# Patient Record
Sex: Female | Born: 1960
Health system: Southern US, Community
[De-identification: ages and names within clinical notes are randomized; demographics above are authoritative.]

## PROBLEM LIST (undated history)

## (undated) DIAGNOSIS — I10 Essential (primary) hypertension: Secondary | ICD-10-CM

## (undated) DIAGNOSIS — J189 Pneumonia, unspecified organism: Secondary | ICD-10-CM

## (undated) DIAGNOSIS — M7989 Other specified soft tissue disorders: Secondary | ICD-10-CM

## (undated) DIAGNOSIS — E785 Hyperlipidemia, unspecified: Secondary | ICD-10-CM

## (undated) DIAGNOSIS — L603 Nail dystrophy: Secondary | ICD-10-CM

## (undated) DIAGNOSIS — R609 Edema, unspecified: Secondary | ICD-10-CM

## (undated) DIAGNOSIS — E538 Deficiency of other specified B group vitamins: Secondary | ICD-10-CM

## (undated) DIAGNOSIS — Z973 Presence of spectacles and contact lenses: Secondary | ICD-10-CM

## (undated) DIAGNOSIS — H18609 Keratoconus, unspecified, unspecified eye: Secondary | ICD-10-CM

## (undated) DIAGNOSIS — R1310 Dysphagia, unspecified: Secondary | ICD-10-CM

## (undated) DIAGNOSIS — E559 Vitamin D deficiency, unspecified: Secondary | ICD-10-CM

## (undated) DIAGNOSIS — M2550 Pain in unspecified joint: Secondary | ICD-10-CM

## (undated) DIAGNOSIS — K589 Irritable bowel syndrome without diarrhea: Secondary | ICD-10-CM

## (undated) DIAGNOSIS — K219 Gastro-esophageal reflux disease without esophagitis: Secondary | ICD-10-CM

## (undated) DIAGNOSIS — Z91018 Allergy to other foods: Secondary | ICD-10-CM

## (undated) DIAGNOSIS — T7840XA Allergy, unspecified, initial encounter: Secondary | ICD-10-CM

## (undated) DIAGNOSIS — K9041 Non-celiac gluten sensitivity: Secondary | ICD-10-CM

## (undated) DIAGNOSIS — E669 Obesity, unspecified: Secondary | ICD-10-CM

## (undated) DIAGNOSIS — S0300XA Dislocation of jaw, unspecified side, initial encounter: Secondary | ICD-10-CM

## (undated) DIAGNOSIS — K829 Disease of gallbladder, unspecified: Secondary | ICD-10-CM

## (undated) DIAGNOSIS — D649 Anemia, unspecified: Secondary | ICD-10-CM

## (undated) HISTORY — DX: Pain in unspecified joint: M25.50

## (undated) HISTORY — DX: Essential (primary) hypertension: I10

## (undated) HISTORY — PX: TONSILLECTOMY: SUR1361

## (undated) HISTORY — DX: Presence of spectacles and contact lenses: Z97.3

## (undated) HISTORY — DX: Morbid (severe) obesity due to excess calories: E66.01

## (undated) HISTORY — DX: Allergy, unspecified, initial encounter: T78.40XA

## (undated) HISTORY — DX: Keratoconus, unspecified, unspecified eye: H18.609

## (undated) HISTORY — DX: Irritable bowel syndrome, unspecified: K58.9

## (undated) HISTORY — DX: Dislocation of jaw, unspecified side, initial encounter: S03.00XA

## (undated) HISTORY — DX: Edema, unspecified: R60.9

## (undated) HISTORY — DX: Anemia, unspecified: D64.9

## (undated) HISTORY — PX: CHOLECYSTECTOMY: SHX55

## (undated) HISTORY — DX: Non-celiac gluten sensitivity: K90.41

## (undated) HISTORY — DX: Vitamin D deficiency, unspecified: E55.9

## (undated) HISTORY — DX: Nail dystrophy: L60.3

## (undated) HISTORY — DX: Hyperlipidemia, unspecified: E78.5

## (undated) HISTORY — DX: Disease of gallbladder, unspecified: K82.9

## (undated) HISTORY — DX: Deficiency of other specified B group vitamins: E53.8

## (undated) HISTORY — DX: Allergy to other foods: Z91.018

## (undated) HISTORY — DX: Other specified soft tissue disorders: M79.89

## (undated) HISTORY — DX: Obesity, unspecified: E66.9

## (undated) HISTORY — DX: Dysphagia, unspecified: R13.10

---

## 2000-02-02 ENCOUNTER — Other Ambulatory Visit: Admission: RE | Admit: 2000-02-02 | Discharge: 2000-02-02 | Payer: Self-pay | Admitting: *Deleted

## 2002-04-10 ENCOUNTER — Encounter: Admission: RE | Admit: 2002-04-10 | Discharge: 2002-07-09 | Payer: Self-pay | Admitting: Internal Medicine

## 2002-07-17 ENCOUNTER — Encounter: Admission: RE | Admit: 2002-07-17 | Discharge: 2002-10-08 | Payer: Self-pay | Admitting: Internal Medicine

## 2004-06-10 ENCOUNTER — Emergency Department (HOSPITAL_COMMUNITY): Admission: EM | Admit: 2004-06-10 | Discharge: 2004-06-10 | Payer: Self-pay | Admitting: Emergency Medicine

## 2006-07-28 ENCOUNTER — Ambulatory Visit (HOSPITAL_COMMUNITY): Admission: RE | Admit: 2006-07-28 | Discharge: 2006-07-28 | Payer: Self-pay | Admitting: *Deleted

## 2006-07-28 ENCOUNTER — Encounter: Admission: RE | Admit: 2006-07-28 | Discharge: 2006-07-28 | Payer: Self-pay | Admitting: *Deleted

## 2006-08-03 ENCOUNTER — Ambulatory Visit (HOSPITAL_COMMUNITY): Admission: RE | Admit: 2006-08-03 | Discharge: 2006-08-03 | Payer: Self-pay | Admitting: *Deleted

## 2007-01-09 ENCOUNTER — Encounter: Admission: RE | Admit: 2007-01-09 | Discharge: 2007-04-09 | Payer: Self-pay | Admitting: *Deleted

## 2007-01-23 ENCOUNTER — Ambulatory Visit (HOSPITAL_COMMUNITY): Admission: RE | Admit: 2007-01-23 | Discharge: 2007-01-24 | Payer: Self-pay | Admitting: *Deleted

## 2007-01-23 HISTORY — PX: LAPAROSCOPIC GASTRIC BANDING: SHX1100

## 2011-01-16 ENCOUNTER — Encounter: Payer: Self-pay | Admitting: Internal Medicine

## 2011-07-13 ENCOUNTER — Ambulatory Visit (INDEPENDENT_AMBULATORY_CARE_PROVIDER_SITE_OTHER): Payer: Self-pay | Admitting: Surgery

## 2011-08-17 ENCOUNTER — Encounter (INDEPENDENT_AMBULATORY_CARE_PROVIDER_SITE_OTHER): Payer: Self-pay | Admitting: General Surgery

## 2011-08-19 ENCOUNTER — Ambulatory Visit (INDEPENDENT_AMBULATORY_CARE_PROVIDER_SITE_OTHER): Payer: 59 | Admitting: Surgery

## 2011-08-19 ENCOUNTER — Encounter (INDEPENDENT_AMBULATORY_CARE_PROVIDER_SITE_OTHER): Payer: Self-pay | Admitting: Surgery

## 2011-08-19 DIAGNOSIS — Z9889 Other specified postprocedural states: Secondary | ICD-10-CM

## 2011-08-19 DIAGNOSIS — Z9884 Bariatric surgery status: Secondary | ICD-10-CM

## 2011-08-19 DIAGNOSIS — M793 Panniculitis, unspecified: Secondary | ICD-10-CM

## 2011-08-19 NOTE — Progress Notes (Signed)
Susan Dorsey (new name) comes in to discuss panniculectomy. A recalculated all of her weight loss as she corrected neither preoperative weight was in fact 372 instead of 318. With that she's loss in excess of 150 pounds. She has a very prominent pannus beneath which is a lot of maceration. Earlier in the year she had ringworm. That's no question she has significant panniculitis and maceration.She would like for me to do a panniculectomy. I told her that I did this in a way that oftentimes resect the umbilicus. She realizes this may be different from the white plastic surgeons practice this. This is noncosmetic A. She is at significant medical issues with it.  From the standpoint of her vagotomy she has done very well. The vagotomy ablated the hunger pains that she would often have when she was working night shift. She denies any diarrhea or dumping. She did very happy with her success and is in the green zone. She is off the 2 medications that she was previously on for hypertension.  Plan Will take pictures of her panniculitis today to submit for insurance approval.

## 2012-03-02 ENCOUNTER — Encounter (INDEPENDENT_AMBULATORY_CARE_PROVIDER_SITE_OTHER): Payer: 59 | Admitting: Surgery

## 2012-03-09 ENCOUNTER — Encounter (INDEPENDENT_AMBULATORY_CARE_PROVIDER_SITE_OTHER): Payer: 59 | Admitting: Surgery

## 2012-03-28 ENCOUNTER — Encounter (INDEPENDENT_AMBULATORY_CARE_PROVIDER_SITE_OTHER): Payer: 59 | Admitting: Surgery

## 2012-05-03 ENCOUNTER — Encounter (INDEPENDENT_AMBULATORY_CARE_PROVIDER_SITE_OTHER): Payer: 59 | Admitting: Surgery

## 2012-05-25 ENCOUNTER — Encounter (INDEPENDENT_AMBULATORY_CARE_PROVIDER_SITE_OTHER): Payer: 59 | Admitting: Surgery

## 2012-05-28 ENCOUNTER — Encounter (INDEPENDENT_AMBULATORY_CARE_PROVIDER_SITE_OTHER): Payer: Self-pay | Admitting: Surgery

## 2012-05-30 ENCOUNTER — Ambulatory Visit (INDEPENDENT_AMBULATORY_CARE_PROVIDER_SITE_OTHER): Payer: 59 | Admitting: Surgery

## 2012-05-30 ENCOUNTER — Other Ambulatory Visit (INDEPENDENT_AMBULATORY_CARE_PROVIDER_SITE_OTHER): Payer: Self-pay | Admitting: General Surgery

## 2012-05-30 ENCOUNTER — Encounter (INDEPENDENT_AMBULATORY_CARE_PROVIDER_SITE_OTHER): Payer: Self-pay | Admitting: Surgery

## 2012-05-30 VITALS — Ht 67.0 in | Wt 246.2 lb

## 2012-05-30 DIAGNOSIS — Z9884 Bariatric surgery status: Secondary | ICD-10-CM | POA: Insufficient documentation

## 2012-05-30 NOTE — Patient Instructions (Signed)
Will submit photos for panniculectomy approval.

## 2012-05-30 NOTE — Progress Notes (Signed)
Susan Dorsey Body mass index is 38.57 kg/(m^2).  Having regurgitation:  no  Nocturnal reflux?  no  Amount of fill  0 Today's visit was all done getting her approved for a panniculectomy. She has had breakdown of her pannus which she has today. Dr. Renae Gloss has been alternating her with Diflucan for several months. We are taking more pictures today we'll try resubmit disconcerting she would benefit from a panniculectomy.  She has done very well with her vagotomy and a PDS band. She says she's never hungry.  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  She has not had any diarrhea or dumping.

## 2012-07-11 ENCOUNTER — Encounter: Payer: 59 | Attending: Surgery | Admitting: *Deleted

## 2012-07-11 ENCOUNTER — Encounter: Payer: Self-pay | Admitting: *Deleted

## 2012-07-11 DIAGNOSIS — Z713 Dietary counseling and surveillance: Secondary | ICD-10-CM | POA: Insufficient documentation

## 2012-07-11 DIAGNOSIS — Z9884 Bariatric surgery status: Secondary | ICD-10-CM | POA: Insufficient documentation

## 2012-07-11 DIAGNOSIS — Z09 Encounter for follow-up examination after completed treatment for conditions other than malignant neoplasm: Secondary | ICD-10-CM | POA: Insufficient documentation

## 2012-07-11 DIAGNOSIS — R7309 Other abnormal glucose: Secondary | ICD-10-CM | POA: Insufficient documentation

## 2012-07-11 NOTE — Patient Instructions (Addendum)
Goals:  Follow Phase 4: Low-fat, Low-carb Solid Foods  Add 15 grams of carbohydrate (fruit, whole grain, starchy vegetable) with meals  Start Pre-Op diet 2-3 weeks prior to surgery to increase protein intake.   Eat 3-6 small meals/snacks, every 3-5 hrs  Continue intake of lean protein foods to meet 60-80g goal  Continue fluid intake of 64oz + daily  Avoid drinking 15 minutes before, during and 30 minutes after eating  Aim for >30 min of physical activity daily

## 2012-07-11 NOTE — Progress Notes (Signed)
  Follow-up visit:  5 Years Post-Operative LAGB Surgery  Medical Nutrition Therapy:  Appt start time: 1000 end time:  1100.  Primary concerns today:  Post-operative Bariatric Surgery Nutrition Management. Clorinda returns 5 yrs s/p LAGB for f/u. Insurance covers only 1 visit/year. States she has "taken the last year off" including decreased exercise and poor food choices. Reports lowest wt of ~215-220 lbs [facility].  Ariann is also following a GF diet d/t gluten intolerance. Discussed increasing exercise, GF diet/tips, and portion sizes of carbs (ex: fruit, etc). Overall doing very well and recently approved for panniculectomy.  Surgery date: 12/2007 Start weight at [facility]: 370 lbs (per pt)  Weight today: 242.5 lbs Weight change: n/a Total weight lost: 127.5 lbs BMI: 39.1 kg/m^2 Weight goal: 190 lbs % Weight goal met: 71%  TANITA  BODY COMP RESULTS  07/11/12   %Fat 45.5%   Fat Mass (lbs) 110.5   Fat Free Mass (lbs) 132.0   Total Body Water (lbs) 96.5   24-hr recall: B (6 AM): protein shake Snk (10 AM): protein shake  L (PM): [place] roll up with goat cheese and tomato; cucumbers  Snk (PM): 7 almonds  D (PM): Spinach salad OR omlette with mushrooms, spinach, onion (sauteed) and cheese  Snk (PM): 1 Tbsp peanut butter with cool whip, unsweetened carobs, 1 pkt stevia OR 1/2 cup ice cream  Fluid intake: > 64 oz Estimated total protein intake: ~80 g  Medications: See medication list. Only reports taking flonase and zyrtec. Supplementation: Taking gummies (both MVI & calcium) regularly without problems. Discussed risks and stated she understood.  Using straws: No Drinking while eating: No Hair loss: No Carbonated beverages: Yes - (2) diet Mtn Dew daily. Discussed discontinuing. N/V/D/C: No Last Lap-Band fill:  Fluid removed from band 1.5 yrs ago. Is interested in refilling band at this time. Has appt with Dr. Daphine Deutscher next week to discuss.   Recent physical activity:  Resuming.  Currently: 30 min cardio 2-3 times/week; Zumba (60 min) 1 time/week  Progress Towards Goal(s):  In progress.  Handouts given during visit include:  GF Snack List  Phase IV: Low-fat, Low-carb Solid Food  Bariatric Surgery Pre-Op Diet  Bariatric Surgery Fast Food Guide    Nutritional Diagnosis:  [place]-3.3 Overweight/obesity related to LAGB surgery as evidenced by patient following post-op nutrition guidelines for continued weight loss.    Intervention:  Nutrition education/reinforcement.  Monitoring/Evaluation:  Dietary intake, exercise, lap band fills, and body weight. Follow up after panniculectomy or prn.

## 2012-07-20 ENCOUNTER — Encounter (INDEPENDENT_AMBULATORY_CARE_PROVIDER_SITE_OTHER): Payer: Self-pay | Admitting: Surgery

## 2012-07-20 ENCOUNTER — Ambulatory Visit (INDEPENDENT_AMBULATORY_CARE_PROVIDER_SITE_OTHER): Payer: 59 | Admitting: Surgery

## 2012-07-20 ENCOUNTER — Other Ambulatory Visit (INDEPENDENT_AMBULATORY_CARE_PROVIDER_SITE_OTHER): Payer: Self-pay | Admitting: Surgery

## 2012-07-20 VITALS — BP 128/80 | HR 70 | Temp 97.4°F | Resp 16 | Ht 66.5 in | Wt 243.1 lb

## 2012-07-20 DIAGNOSIS — M793 Panniculitis, unspecified: Secondary | ICD-10-CM

## 2012-07-20 NOTE — Patient Instructions (Addendum)
For surgery scheduling.

## 2012-07-20 NOTE — Progress Notes (Signed)
Chief Complaint:  Irritated pannus, redundant skin of abdomen  History of Present Illness:  Susan Dorsey is an 51 y.o. female presents today to discuss her panniculectomy.  Photos have been submitted and she is approved for abdominal panniculectomy. 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 We talked about removal of the skin as well as removal of her umbilicus which she is very comfortable with.  She asked about doing her mid thighs and she may qualify for radiofrequency ablation of this area.    I answered her questions regarding panniculectomy and probable use of a incisional VAC.  She's ready to go ahead and proceed with scheduling. Also mentioned using Hibiclens showers a couple of days before her surgery.  Past Medical History  Diagnosis Date  . Wears glasses   . Morbid obesity   . Gluten intolerance   . Hypertension     Resolved after LAGB    Past Surgical History  Procedure Date  . Tonsilectomy, adenoidectomy, bilateral myringotomy and tubes 1972  . Laparoscopic gastric banding 01/23/2007  . Cholecystectomy 1990's    Patient unsure of date    Current Outpatient Prescriptions  Medication Sig Dispense Refill  . cetirizine (ZYRTEC) 10 MG tablet Take 10 mg by mouth daily.        . clotrimazole-betamethasone (LOTRISONE) cream       . fluticasone (FLONASE) 50 MCG/ACT nasal spray       . LOW-OGESTREL 0.3-30 MG-MCG tablet        Aspirin; Chocolate; and Gluten meal Family History  Problem Relation Age of Onset  . Hypertension Mother   . Arthritis Mother   . Cancer Father     lliver, pancreas, stomach  . Chronic fatigue Sister    Social History:   reports that she has never smoked. She does not have any smokeless tobacco history on file. She reports that she does not drink alcohol or use illicit drugs.   REVIEW OF SYSTEMS - PERTINENT POSITIVES ONLY: noncontributory  Physical Exam:   Blood pressure  128/80, pulse 70, temperature 97.4 F (36.3 C), temperature source Temporal, resp. rate 16, height 5' 6.5" (1.689 m), weight 243 lb 2 oz (110.281 kg). Body mass index is 38.65 kg/(m^2).  Gen:  WDWN WF NAD  Neurological: Alert and oriented to person, place, and time. Motor and sensory function is grossly intact  Head: Normocephalic and atraumatic.  Eyes: Conjunctivae are normal. Pupils are equal, round, and reactive to light. No scleral icterus.  Neck: Normal range of motion. Neck supple. No tracheal deviation or thyromegaly present.  Cardiovascular:  SR without murmurs or gallops.  No carotid bruits Respiratory: Effort normal.  No respiratory distress. No chest wall tenderness. Breath sounds normal.  No wheezes, rales or rhonchi.  Abdomen:  Two Areas of redundancy are noted upon standing. There is a supra-umbilical area and a larger infraumbilical area. Should lie for me to remove both and I think I would take pannus beginning below her poor removing the umbilicus and trying to get both redundant folds. GU: Musculoskeletal: Normal range of motion. Extremities are nontender. No cyanosis, edema or clubbing noted Lymphadenopathy: No cervical, preauricular, postauricular or axillary adenopathy is present Skin: Skin is warm and dry. No rash noted. No diaphoresis. No erythema. No pallor. Pscyh: Normal mood and affect. Behavior is normal. Judgment and thought content normal.   LABORATORY RESULTS: No results found for this or any previous visit (  from the past 48 hour(s)).  RADIOLOGY RESULTS: No results found.  Problem List: Patient Active Problem List  Diagnosis  . Panniculitis  . Lapband APS trucal vagotomy Jan 2008    Assessment & Plan: Abdominal wall redundancy with panniculitis.  Plan panniculectomy.      Matt B. Daphine Deutscher, MD, [facility] [phone] beeper [phone]  07/20/2012 10:49 AM

## 2012-09-06 ENCOUNTER — Encounter (HOSPITAL_COMMUNITY): Payer: Self-pay | Admitting: Pharmacy Technician

## 2012-09-10 NOTE — Patient Instructions (Signed)
20 Susan Dorsey  09/10/2012   Your procedure is scheduled on:  09/24/12 [id number]  Report to [facility] at 0515 AM.  Call this number if you have problems the morning of surgery: [phone]   Remember: fleets enema nite before surgery    Do not eat food:After Midnight.  May have clear liquids:until Midnight .   Take these medicines the morning of surgery with A SIP OF WATER:    Do not wear jewelry, make-up or nail polish.  Do not wear lotions, powders, or perfumes.   Do not shave 48 hours prior to surgery.   Do not bring valuables to the hospital.  Contacts, dentures or bridgework may not be worn into surgery.  Leave suitcase in the car. After surgery it may be brought to your room.  For patients admitted to the hospital, checkout time is 11:00 AM the day of discharge.     Special Instructions: CHG Shower Use Special Wash: 1/2 bottle night before surgery and 1/2 bottle morning of surgery. shower chin to toes with CHG.  Wash face and private parts with regular soap.    Please read over the following fact sheets that you were given: MRSA Information, coughing and deep breathing exercises, leg exercises

## 2012-09-11 ENCOUNTER — Encounter (HOSPITAL_COMMUNITY): Payer: Self-pay

## 2012-09-11 ENCOUNTER — Encounter (HOSPITAL_COMMUNITY)
Admission: RE | Admit: 2012-09-11 | Discharge: 2012-09-11 | Disposition: A | Discharge status: Home / Self Care | Payer: 59 | Source: Ambulatory Visit | Attending: Surgery | Admitting: Surgery

## 2012-09-11 HISTORY — DX: Gastro-esophageal reflux disease without esophagitis: K21.9

## 2012-09-11 LAB — CBC
HCT: 40 % (ref 36.0–46.0)
Hemoglobin: 13.1 g/dL (ref 12.0–15.0)
MCH: 29.4 pg (ref 26.0–34.0)
MCHC: 32.8 g/dL (ref 30.0–36.0)
MCV: 89.7 fL (ref 78.0–100.0)
Platelets: 267 10*3/uL (ref 150–400)
RBC: 4.46 MIL/uL (ref 3.87–5.11)
RDW: 13.9 % (ref 11.5–15.5)
WBC: 8.1 10*3/uL (ref 4.0–10.5)

## 2012-09-11 LAB — HCG, SERUM, QUALITATIVE: Preg, Serum: NEGATIVE

## 2012-09-11 LAB — SURGICAL PCR SCREEN
MRSA, PCR: NEGATIVE
Staphylococcus aureus: NEGATIVE

## 2012-09-24 ENCOUNTER — Encounter (HOSPITAL_COMMUNITY): Payer: Self-pay | Admitting: *Deleted

## 2012-09-24 ENCOUNTER — Encounter (HOSPITAL_COMMUNITY): Payer: Self-pay | Admitting: Registered Nurse

## 2012-09-24 ENCOUNTER — Observation Stay (HOSPITAL_COMMUNITY)
Admission: RE | Admit: 2012-09-24 | Discharge: 2012-09-26 | DRG: 581 | Disposition: A | Discharge status: Home / Self Care | Payer: 59 | Source: Ambulatory Visit | Attending: Surgery | Admitting: Surgery

## 2012-09-24 ENCOUNTER — Encounter (HOSPITAL_COMMUNITY)
Admission: RE | Disposition: A | Discharge status: Home / Self Care | Payer: Self-pay | Source: Ambulatory Visit | Attending: Surgery

## 2012-09-24 ENCOUNTER — Ambulatory Visit (HOSPITAL_COMMUNITY): Payer: 59 | Admitting: Registered Nurse

## 2012-09-24 DIAGNOSIS — I10 Essential (primary) hypertension: Secondary | ICD-10-CM | POA: Diagnosis present

## 2012-09-24 DIAGNOSIS — Z6838 Body mass index (BMI) 38.0-38.9, adult: Secondary | ICD-10-CM

## 2012-09-24 DIAGNOSIS — E65 Localized adiposity: Secondary | ICD-10-CM | POA: Insufficient documentation

## 2012-09-24 DIAGNOSIS — M793 Panniculitis, unspecified: Principal | ICD-10-CM | POA: Diagnosis present

## 2012-09-24 DIAGNOSIS — Z9884 Bariatric surgery status: Secondary | ICD-10-CM

## 2012-09-24 HISTORY — PX: PANNICULECTOMY: SHX5360

## 2012-09-24 LAB — CBC
HCT: 41.5 % (ref 36.0–46.0)
Hemoglobin: 13.8 g/dL (ref 12.0–15.0)
MCH: 29.8 pg (ref 26.0–34.0)
MCHC: 33.3 g/dL (ref 30.0–36.0)
MCV: 89.6 fL (ref 78.0–100.0)
Platelets: 228 10*3/uL (ref 150–400)
RBC: 4.63 MIL/uL (ref 3.87–5.11)
RDW: 13.7 % (ref 11.5–15.5)
WBC: 12 10*3/uL — ABNORMAL HIGH (ref 4.0–10.5)

## 2012-09-24 LAB — CREATININE, SERUM
Creatinine, Ser: 0.75 mg/dL (ref 0.50–1.10)
GFR calc Af Amer: >90 mL/min (ref >90)
GFR calc non Af Amer: >90 mL/min (ref >90)

## 2012-09-24 SURGERY — PANNICULECTOMY
Anesthesia: General | Site: Abdomen | Wound class: Clean

## 2012-09-24 MED ORDER — HEPARIN SODIUM (PORCINE) 5000 UNIT/ML IJ SOLN
5000.0000 [IU] | Freq: Three times a day (TID) | INTRAMUSCULAR | Status: DC
  Administered 2012-09-24 – 2012-09-26 (×6): 5000 [IU] via SUBCUTANEOUS
  Filled 2012-09-24 (×9): qty 1

## 2012-09-24 MED ORDER — ONDANSETRON HCL 4 MG/2ML IJ SOLN
4.0000 mg | Freq: Four times a day (QID) | INTRAMUSCULAR | Status: DC | PRN
  Administered 2012-09-24: 4 mg via INTRAVENOUS
  Filled 2012-09-24: qty 2

## 2012-09-24 MED ORDER — DEXTROSE 5 % IV SOLN
1.0000 g | Freq: Four times a day (QID) | INTRAVENOUS | Status: AC
  Administered 2012-09-24: 1 g via INTRAVENOUS
  Filled 2012-09-24: qty 1

## 2012-09-24 MED ORDER — ONDANSETRON HCL 4 MG PO TABS: 4.0000 mg | ORAL_TABLET | Freq: Four times a day (QID) | ORAL | Status: DC | PRN

## 2012-09-24 MED ORDER — FLUTICASONE PROPIONATE 50 MCG/ACT NA SUSP
2.0000 | Freq: Every day | NASAL | Status: DC
  Administered 2012-09-25 – 2012-09-26 (×2): 2 via NASAL
  Filled 2012-09-24: qty 16

## 2012-09-24 MED ORDER — BUPIVACAINE LIPOSOME 1.3 % IJ SUSP
20.0000 mL | Freq: Once | INTRAMUSCULAR | Status: AC
  Administered 2012-09-24: 20 mL
  Filled 2012-09-24: qty 20

## 2012-09-24 MED ORDER — ACETAMINOPHEN 10 MG/ML IV SOLN
INTRAVENOUS | Status: AC
  Filled 2012-09-24: qty 100

## 2012-09-24 MED ORDER — PROPOFOL 10 MG/ML IV BOLUS
INTRAVENOUS | Status: DC | PRN
  Administered 2012-09-24: 200 mg via INTRAVENOUS

## 2012-09-24 MED ORDER — BIOTENE DRY MOUTH MT LIQD
15.0000 mL | Freq: Two times a day (BID) | OROMUCOSAL | Status: DC
  Administered 2012-09-24 – 2012-09-25 (×3): 15 mL via OROMUCOSAL

## 2012-09-24 MED ORDER — PROMETHAZINE HCL 25 MG/ML IJ SOLN: 6.2500 mg | INTRAMUSCULAR | Status: DC | PRN

## 2012-09-24 MED ORDER — CEFOXITIN SODIUM-DEXTROSE 1-4 GM-% IV SOLR (PREMIX)
INTRAVENOUS | Status: AC
  Filled 2012-09-24: qty 100

## 2012-09-24 MED ORDER — LACTATED RINGERS IV SOLN: INTRAVENOUS | Status: DC

## 2012-09-24 MED ORDER — INFLUENZA VIRUS VACC SPLIT PF IM SUSP
0.5000 mL | INTRAMUSCULAR | Status: AC
  Filled 2012-09-24: qty 0.5

## 2012-09-24 MED ORDER — HYDROMORPHONE HCL PF 1 MG/ML IJ SOLN
INTRAMUSCULAR | Status: AC
  Filled 2012-09-24: qty 1

## 2012-09-24 MED ORDER — ACETAMINOPHEN 10 MG/ML IV SOLN
INTRAVENOUS | Status: DC | PRN
  Administered 2012-09-24: 1000 mg via INTRAVENOUS

## 2012-09-24 MED ORDER — POTASSIUM CHLORIDE IN NACL 20-0.45 MEQ/L-% IV SOLN
INTRAVENOUS | Status: DC
  Administered 2012-09-24 – 2012-09-26 (×3): via INTRAVENOUS
  Filled 2012-09-24 (×5): qty 1000

## 2012-09-24 MED ORDER — OXYCODONE-ACETAMINOPHEN 5-325 MG PO TABS
1.0000 | ORAL_TABLET | ORAL | Status: DC | PRN
  Administered 2012-09-26: 1 via ORAL
  Filled 2012-09-24: qty 1

## 2012-09-24 MED ORDER — ROCURONIUM BROMIDE 100 MG/10ML IV SOLN
INTRAVENOUS | Status: DC | PRN
  Administered 2012-09-24: 10 mg via INTRAVENOUS
  Administered 2012-09-24: 50 mg via INTRAVENOUS

## 2012-09-24 MED ORDER — CHLORHEXIDINE GLUCONATE 4 % EX LIQD
1.0000 "application " | Freq: Once | CUTANEOUS | Status: DC
  Filled 2012-09-24: qty 15

## 2012-09-24 MED ORDER — ONDANSETRON HCL 4 MG/2ML IJ SOLN
INTRAMUSCULAR | Status: DC | PRN
  Administered 2012-09-24: 4 mg via INTRAVENOUS

## 2012-09-24 MED ORDER — LORATADINE 10 MG PO TABS
10.0000 mg | ORAL_TABLET | Freq: Every day | ORAL | Status: DC
  Filled 2012-09-24 (×2): qty 1

## 2012-09-24 MED ORDER — MEPERIDINE HCL 50 MG/ML IJ SOLN: 6.2500 mg | INTRAMUSCULAR | Status: DC | PRN

## 2012-09-24 MED ORDER — LACTATED RINGERS IV SOLN
INTRAVENOUS | Status: DC | PRN
  Administered 2012-09-24 (×2): via INTRAVENOUS

## 2012-09-24 MED ORDER — NORGESTREL-ETHINYL ESTRADIOL 0.3-30 MG-MCG PO TABS
1.0000 | ORAL_TABLET | Freq: Every day | ORAL | Status: DC
  Administered 2012-09-25 – 2012-09-26 (×2): 1 via ORAL

## 2012-09-24 MED ORDER — DEXTROSE 5 % IV SOLN
2.0000 g | INTRAVENOUS | Status: AC
  Administered 2012-09-24: 2 g via INTRAVENOUS
  Filled 2012-09-24: qty 2

## 2012-09-24 MED ORDER — DEXAMETHASONE SODIUM PHOSPHATE 10 MG/ML IJ SOLN
INTRAMUSCULAR | Status: DC | PRN
  Administered 2012-09-24: 10 mg via INTRAVENOUS

## 2012-09-24 MED ORDER — FENTANYL CITRATE 0.05 MG/ML IJ SOLN
INTRAMUSCULAR | Status: DC | PRN
  Administered 2012-09-24: 50 ug via INTRAVENOUS
  Administered 2012-09-24 (×2): 25 ug via INTRAVENOUS
  Administered 2012-09-24 (×3): 50 ug via INTRAVENOUS

## 2012-09-24 MED ORDER — OXYCODONE HCL 5 MG/5ML PO SOLN
5.0000 mg | Freq: Once | ORAL | Status: DC | PRN
  Filled 2012-09-24: qty 5

## 2012-09-24 MED ORDER — MIDAZOLAM HCL 5 MG/5ML IJ SOLN
INTRAMUSCULAR | Status: DC | PRN
  Administered 2012-09-24: 2 mg via INTRAVENOUS

## 2012-09-24 MED ORDER — HYDROMORPHONE HCL PF 1 MG/ML IJ SOLN
0.2500 mg | INTRAMUSCULAR | Status: DC | PRN
  Administered 2012-09-24: 0.5 mg via INTRAVENOUS
  Administered 2012-09-24: 0.25 mg via INTRAVENOUS
  Administered 2012-09-24: 0.5 mg via INTRAVENOUS
  Administered 2012-09-24: 0.25 mg via INTRAVENOUS

## 2012-09-24 MED ORDER — OXYCODONE HCL 5 MG PO TABS: 5.0000 mg | ORAL_TABLET | Freq: Once | ORAL | Status: DC | PRN

## 2012-09-24 MED ORDER — ACETAMINOPHEN 10 MG/ML IV SOLN: 1000.0000 mg | Freq: Once | INTRAVENOUS | Status: DC | PRN

## 2012-09-24 MED ORDER — LIDOCAINE HCL (CARDIAC) 20 MG/ML IV SOLN
INTRAVENOUS | Status: DC | PRN
  Administered 2012-09-24: 100 mg via INTRAVENOUS

## 2012-09-24 MED ORDER — HEPARIN SODIUM (PORCINE) 5000 UNIT/ML IJ SOLN
5000.0000 [IU] | Freq: Once | INTRAMUSCULAR | Status: AC
  Administered 2012-09-24: 5000 [IU] via SUBCUTANEOUS
  Filled 2012-09-24: qty 1

## 2012-09-24 MED ORDER — MORPHINE SULFATE 2 MG/ML IJ SOLN: 1.0000 mg | INTRAMUSCULAR | Status: DC | PRN

## 2012-09-24 MED ORDER — NEOSTIGMINE METHYLSULFATE 1 MG/ML IJ SOLN
INTRAMUSCULAR | Status: DC | PRN
  Administered 2012-09-24: 5 mg via INTRAVENOUS

## 2012-09-24 MED ORDER — GLYCOPYRROLATE 0.2 MG/ML IJ SOLN
INTRAMUSCULAR | Status: DC | PRN
  Administered 2012-09-24: .8 mg via INTRAVENOUS

## 2012-09-24 SURGICAL SUPPLY — 39 items
APPLICATOR COTTON TIP 6IN STRL (MISCELLANEOUS) ×2 IMPLANT
BLADE EXTENDED COATED 6.5IN (ELECTRODE) ×2 IMPLANT
BLADE HEX COATED 2.75 (ELECTRODE) ×4 IMPLANT
BLADE SURG 15 STRL LF DISP TIS (BLADE) ×1 IMPLANT
BLADE SURG 15 STRL SS (BLADE) ×1
BLADE SURG SZ10 CARB STEEL (BLADE) ×2 IMPLANT
CANISTER SUCTION 2500CC (MISCELLANEOUS) ×2 IMPLANT
CLOTH BEACON ORANGE TIMEOUT ST (SAFETY) ×2 IMPLANT
COVER MAYO STAND STRL (DRAPES) IMPLANT
DRAPE WARM FLUID 44X44 (DRAPE) ×2 IMPLANT
DRSG TEGADERM 8X12 (GAUZE/BANDAGES/DRESSINGS) ×4 IMPLANT
DRSG VAC ATS LRG SENSATRAC (GAUZE/BANDAGES/DRESSINGS) ×2 IMPLANT
ELECT REM PT RETURN 9FT ADLT (ELECTROSURGICAL) ×4
ELECTRODE REM PT RTRN 9FT ADLT (ELECTROSURGICAL) ×2 IMPLANT
GLOVE BIOGEL M 8.0 STRL (GLOVE) ×2 IMPLANT
GLOVE BIOGEL PI IND STRL 7.0 (GLOVE) ×4 IMPLANT
GLOVE BIOGEL PI INDICATOR 7.0 (GLOVE) ×4
GOWN STRL NON-REIN LRG LVL3 (GOWN DISPOSABLE) ×4 IMPLANT
GOWN STRL REIN XL XLG (GOWN DISPOSABLE) ×6 IMPLANT
HOVERMATT SINGLE USE (MISCELLANEOUS) ×2 IMPLANT
KIT BASIN OR (CUSTOM PROCEDURE TRAY) ×2 IMPLANT
LIGASURE IMPACT 36 18CM CVD LR (INSTRUMENTS) ×4 IMPLANT
NS IRRIG 1000ML POUR BTL (IV SOLUTION) ×4 IMPLANT
PACK UNIVERSAL I (CUSTOM PROCEDURE TRAY) ×2 IMPLANT
PENCIL BUTTON HOLSTER BLD 10FT (ELECTRODE) ×2 IMPLANT
SPONGE GAUZE 4X4 12PLY (GAUZE/BANDAGES/DRESSINGS) ×2 IMPLANT
SPONGE LAP 18X18 X RAY DECT (DISPOSABLE) ×6 IMPLANT
STAPLER VISISTAT 35W (STAPLE) ×8 IMPLANT
SUCTION POOLE TIP (SUCTIONS) ×2 IMPLANT
SUT ETHILON 4 0 PS 2 18 (SUTURE) ×12 IMPLANT
SUT SILK 2 0 (SUTURE) ×1
SUT SILK 2-0 18XBRD TIE 12 (SUTURE) ×1 IMPLANT
SUT VIC AB 2-0 SH 18 (SUTURE) ×12 IMPLANT
SUT VIC AB 4-0 SH 18 (SUTURE) ×8 IMPLANT
SYR BULB IRRIGATION 50ML (SYRINGE) ×2 IMPLANT
TOWEL OR 17X26 10 PK STRL BLUE (TOWEL DISPOSABLE) ×6 IMPLANT
TRAY FOLEY CATH 14FRSI W/METER (CATHETERS) ×4 IMPLANT
YANKAUER SUCT BULB TIP 10FT TU (MISCELLANEOUS) ×2 IMPLANT
YANKAUER SUCT BULB TIP NO VENT (SUCTIONS) ×2 IMPLANT

## 2012-09-24 NOTE — Anesthesia Postprocedure Evaluation (Signed)
Anesthesia Post Note  Patient: Susan Dorsey  Procedure(s) Performed: Procedure(s) (LRB): PANNICULECTOMY (N/A)  Anesthesia type: General  Patient location: PACU  Post pain: Pain level controlled  Post assessment: Post-op Vital signs reviewed  Last Vitals: BP 137/74  Pulse 60  Temp 36.9 C  Resp 15  SpO2 100%  Post vital signs: Reviewed  Level of consciousness: sedated  Complications: No apparent anesthesia complications

## 2012-09-24 NOTE — Op Note (Signed)
Surgeon: Wenda Low, MD, FACS  Asst:  Jaclynn Guarneri, M.D., FACS  AnesDicky Doe.  Procedure: Abdominal panniculectomy (7 pounds )  Diagnosis: Maceration after weight loss of pannus and continued irritation-panniculitis  Complications: none  EBL:   20 cc   Description of Procedure:  The patient was taken to room 1 and given general anesthesia. Preoperatively she received 2 g of Mefoxin and a dose of IV acetaminophen. After general anesthesia the pannus had a preliminary scrubbing with PCMX as well as placement of a Foley.she was then formally prepped with PCMX and draped sterilely. This gave [place] room from up on the chest down to the mid thighs perineum and lateral to the beds.  I had marked the patient in the holding area in the standing position and we discussed removing her umbilicus at the time of her pannus his or her couple different folds. We then estimated her excision and created a lateral flaps and coming across midline moving the umbilicus above and going down near the escutcheon  Below.   We were then took our dissection down to the fascia using 2 ligature device is doing simultaneous dissections on the right and left. Then came down from above ligating vessels with the 2-0 Vicryl sutures needed. The pannus was then removed and it was weighed and subsequently felt to be around 7 pounds. We irrigated and inspected control bleeding. Within approximately midline laterally in used layers of 2-0 Vicryl 3-0 Vicryl and 4-0 Vicryl subcutaneous and 6 subcuticularly approximates skin. We injected the fascia with Exparel taking 20 cc including and up to 40. We then closed the skin with subcutaneous 4-0 Vicryl with an vertical mattress sutures of 4-0 nylon and staples. Wound was then re\re dried and then the incision VAC was placed the cutting a sponge as a cement gun make one longitudinal foam placed over the staple line and then applying suction to this. H. Was and taken recovery room in satisfactory  condition. Matt B. Daphine Deutscher, MD, [facility], [place] [phone]

## 2012-09-24 NOTE — Anesthesia Preprocedure Evaluation (Addendum)
Anesthesia Evaluation  Patient identified by MRN, date of birth, ID band Patient awake    Reviewed: Allergy & Precautions, H&P , NPO status , Patient's Chart, lab work & pertinent test results  Airway Mallampati: II TM Distance: >3 FB Neck ROM: Full    Dental  (+) Teeth Intact and Dental Advisory Given   Pulmonary neg pulmonary ROS,  breath sounds clear to auscultation  Pulmonary exam normal       Cardiovascular hypertension, Pt. on medications Rhythm:Regular Rate:Normal     Neuro/Psych negative neurological ROS  negative psych ROS   GI/Hepatic Neg liver ROS, GERD-  Medicated,  Endo/Other  neg diabetesMorbid obesity  Renal/GU Renal disease     Musculoskeletal negative musculoskeletal ROS (+)   Abdominal (+) + obese,   Peds  Hematology negative hematology ROS (+)   Anesthesia Other Findings   Reproductive/Obstetrics                        Anesthesia Physical Anesthesia Plan  ASA: III  Anesthesia Plan: General   Post-op Pain Management:    Induction: Intravenous  Airway Management Planned: Oral ETT  Additional Equipment:   Intra-op Plan:   Post-operative Plan: Extubation in OR  Informed Consent: I have reviewed the patients History and Physical, chart, labs and discussed the procedure including the risks, benefits and alternatives for the proposed anesthesia with the patient or authorized representative who has indicated his/her understanding and acceptance.   Dental advisory given  Plan Discussed with: CRNA and Surgeon  Anesthesia Plan Comments:        Anesthesia Quick Evaluation

## 2012-09-24 NOTE — Preoperative (Signed)
Beta Blockers   Reason not to administer Beta Blockers:Not Applicable 

## 2012-09-24 NOTE — Transfer of Care (Signed)
Immediate Anesthesia Transfer of Care Note  Patient: Susan Dorsey  Procedure(s) Performed: Procedure(s) (LRB) with comments: PANNICULECTOMY (N/A)  Patient Location: PACU  Anesthesia Type: General  Level of Consciousness: awake, alert , oriented and patient cooperative  Airway & Oxygen Therapy: Patient Spontanous Breathing and Patient connected to face mask oxygen  Post-op Assessment: Report given to PACU RN, Post -op Vital signs reviewed and stable and Patient moving all extremities  Post vital signs: Reviewed and stable  Complications: No apparent anesthesia complications

## 2012-09-24 NOTE — H&P (Signed)
Chief Complaint: Irritated pannus, redundant skin of abdomen  History of Present Illness: Susan Dorsey is an 51 y.o. female presents today for panniculectomy. Photos have been submitted and she is approved for abdominal panniculectomy. 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 We talked about removal of the skin as well as removal of her umbilicus which she is very comfortable with. She asked about doing her mid thighs and she may qualify for radiofrequency ablation of this area.  I answered her questions regarding panniculectomy and probable use of a incisional VAC. She's ready to go ahead and proceed with scheduling. Also mentioned using Hibiclens showers a couple of days before her surgery.   Past Medical History   Diagnosis  Date   .  Wears glasses    .  Morbid obesity    .  Gluten intolerance    .  Hypertension      Resolved after LAGB    Past Surgical History   Procedure  Date   .  Tonsilectomy, adenoidectomy, bilateral myringotomy and tubes  1972   .  Laparoscopic gastric banding + Vagotomy 01/23/2007   .  Cholecystectomy  1990's     Patient unsure of date    Current Outpatient Prescriptions   Medication  Sig  Dispense  Refill   .  cetirizine (ZYRTEC) 10 MG tablet  Take 10 mg by mouth daily.     .  clotrimazole-betamethasone (LOTRISONE) cream      .  fluticasone (FLONASE) 50 MCG/ACT nasal spray      .  LOW-OGESTREL 0.3-30 MG-MCG tablet       Aspirin; Chocolate; and Gluten meal  Family History   Problem  Relation  Age of Onset   .  Hypertension  Mother    .  Arthritis  Mother    .  Cancer  Father       lliver, pancreas, stomach    .  Chronic fatigue  Sister     Social History: reports that she has never smoked. She does not have any smokeless tobacco history on file. She reports that she does not drink alcohol or use illicit drugs.  REVIEW OF SYSTEMS - PERTINENT POSITIVES ONLY:  Part of lapband  vagotomy study Physical Exam:  Blood pressure 128/80, pulse 70, temperature 97.4 F (36.3 C), temperature source Temporal, resp. rate 16, height 5' 6.5" (1.689 m), weight 243 lb 2 oz (110.281 kg).  Body mass index is 38.65 kg/(m^2).  Gen: WDWN WF NAD  Neurological: Alert and oriented to person, place, and time. Motor and sensory function is grossly intact  Head: Normocephalic and atraumatic.  Eyes: Conjunctivae are normal. Pupils are equal, round, and reactive to light. No scleral icterus.  Neck: Normal range of motion. Neck supple. No tracheal deviation or thyromegaly present.  Cardiovascular: SR without murmurs or gallops. No carotid bruits  Respiratory: Effort normal. No respiratory distress. No chest wall tenderness. Breath sounds normal. No wheezes, rales or rhonchi.  Abdomen: Two Areas of redundancy are noted upon standing. There is a supra-umbilical area and a larger infraumbilical area. Should lie for me to remove both and I think I would take pannus beginning below her poor removing the umbilicus and trying to get both redundant folds.  GU:  Musculoskeletal: Normal range of motion. Extremities are nontender. No cyanosis, edema or clubbing noted Lymphadenopathy: No cervical, preauricular, postauricular or axillary adenopathy is present Skin: Skin is  warm and dry. No rash noted. No diaphoresis. No erythema. No pallor. Pscyh: Normal mood and affect. Behavior is normal. Judgment and thought content normal.  LABORATORY RESULTS:  No results found for this or any previous visit (from the past 48 hour(s)).  RADIOLOGY RESULTS:  No results found.  Problem List:  Patient Active Problem List   Diagnosis   .  Panniculitis   .  Lapband APS trucal vagotomy Jan 2008    Assessment & Plan:  Abdominal wall redundancy with panniculitis after losing weight from lapband/vagotomy. Plan panniculectomy as discussed above. Patient is aware of the risks and benefits of this procedure.    Matt B. Daphine Deutscher,  MD, [facility]  [phone] beeper  [phone]

## 2012-09-24 NOTE — Anesthesia Procedure Notes (Signed)
Procedure Name: Intubation Date/Time: 09/24/2012 7:58 AM Performed by: Jarvis Newcomer A Pre-anesthesia Checklist: Patient identified, Emergency Drugs available, Suction available, Patient being monitored and Timeout performed Patient Re-evaluated:Patient Re-evaluated prior to inductionOxygen Delivery Method: Circle system utilized Preoxygenation: Pre-oxygenation with 100% oxygen Intubation Type: IV induction Ventilation: Mask ventilation without difficulty Laryngoscope Size: Miller and 3 Grade View: Grade I Tube type: Oral Tube size: 7.0 mm Number of attempts: 1 Airway Equipment and Method: Stylet Placement Confirmation: ETT inserted through vocal cords under direct vision,  positive ETCO2 and breath sounds checked- equal and bilateral Secured at: 22 cm Tube secured with: Tape Dental Injury: Teeth and Oropharynx as per pre-operative assessment

## 2012-09-24 NOTE — Progress Notes (Signed)
Nutrition Consult Note  - Received consult for advanced bariatric diet education. Explained diet to pt and diet options including meal and beverage choices and printed out handout of this information. Pt followed by outpatient bariatric RD and reports following appropriate diet PTA. Pt had no further nutrition questions/concerns.   Levon Hedger MS, RD, LDN [phone] Pager [phone] After Hours Pager

## 2012-09-25 LAB — BASIC METABOLIC PANEL
BUN: 13 mg/dL (ref 6–23)
CO2: 27 mEq/L (ref 19–32)
Calcium: 8.5 mg/dL (ref 8.4–10.5)
Chloride: 105 mEq/L (ref 96–112)
Creatinine, Ser: 0.73 mg/dL (ref 0.50–1.10)
GFR calc Af Amer: >90 mL/min (ref >90)
GFR calc non Af Amer: >90 mL/min (ref >90)
Glucose, Bld: 104 mg/dL — ABNORMAL HIGH (ref 70–99)
Potassium: 4 mEq/L (ref 3.5–5.1)
Sodium: 137 mEq/L (ref 135–145)

## 2012-09-25 LAB — CBC
HCT: 37 % (ref 36.0–46.0)
Hemoglobin: 12.3 g/dL (ref 12.0–15.0)
MCH: 29.7 pg (ref 26.0–34.0)
MCHC: 33.2 g/dL (ref 30.0–36.0)
MCV: 89.4 fL (ref 78.0–100.0)
Platelets: 236 10*3/uL (ref 150–400)
RBC: 4.14 MIL/uL (ref 3.87–5.11)
RDW: 13.9 % (ref 11.5–15.5)
WBC: 14.4 10*3/uL — ABNORMAL HIGH (ref 4.0–10.5)

## 2012-09-25 MED ORDER — CETIRIZINE HCL 10 MG PO TABS
10.0000 mg | ORAL_TABLET | Freq: Every day | ORAL | Status: DC
  Administered 2012-09-25 – 2012-09-26 (×2): 10 mg via ORAL
  Filled 2012-09-25 (×2): qty 1

## 2012-09-25 MED ORDER — NON FORMULARY: 10.0000 mg | Freq: Every day | Status: DC

## 2012-09-25 NOTE — Progress Notes (Signed)
Susan Jennings Pa paged to see if ok to switch to zyrtec

## 2012-09-25 NOTE — Progress Notes (Signed)
Patient ID: Susan Dorsey, female   DOB: 09-16-1961, 51 y.o.   MRN: [id number] [facility] Progress Note:   1 Day Post-Op  Subjective: Mental status is clear. Having no pain to speak of Objective: Vital signs in last 24 hours: Temp:  [97.7 F (36.5 C)-98.2 F (36.8 C)] 98.1 F (36.7 C) (10/01 0542) Pulse Rate:  [54-71] 71  (10/01 0542) Resp:  [16-17] 16  (10/01 0542) BP: (106-135)/(68-84) 106/80 mmHg (10/01 0542) SpO2:  [99 %-100 %] 99 % (10/01 0542)  Intake/Output from previous day: 09/30 0701 - 10/01 0700 In: 3356.3 [P.O.:590; I.V.:2766.3] Out: 5130 [Urine:5030; Blood:100] Intake/Output this shift: Total I/O In: 1170 [I.V.:1170] Out: -   Physical Exam: Work of breathing is normal.  Incisional VAC in place  Lab Results:  Results for orders placed during the hospital encounter of 09/24/12 (from the past 48 hour(s))  CBC     Status: Abnormal   Collection Time   09/24/12 11:55 AM      Component Value Range Comment   WBC 12.0 (*) 4.0 - 10.5 K/uL    RBC 4.63  3.87 - 5.11 MIL/uL    Hemoglobin 13.8  12.0 - 15.0 g/dL    HCT 78.2  95.6 - 21.3 %    MCV 89.6  78.0 - 100.0 fL    MCH 29.8  26.0 - 34.0 pg    MCHC 33.3  30.0 - 36.0 g/dL    RDW 08.6  57.8 - 46.9 %    Platelets 228  150 - 400 K/uL   CREATININE, SERUM     Status: Normal   Collection Time   09/24/12 11:55 AM      Component Value Range Comment   Creatinine, Ser 0.75  0.50 - 1.10 mg/dL    GFR calc non Af Amer >90  >90 mL/min    GFR calc Af Amer >90  >90 mL/min   CBC     Status: Abnormal   Collection Time   09/25/12  4:04 AM      Component Value Range Comment   WBC 14.4 (*) 4.0 - 10.5 K/uL    RBC 4.14  3.87 - 5.11 MIL/uL    Hemoglobin 12.3  12.0 - 15.0 g/dL    HCT 62.9  52.8 - 41.3 %    MCV 89.4  78.0 - 100.0 fL    MCH 29.7  26.0 - 34.0 pg    MCHC 33.2  30.0 - 36.0 g/dL    RDW 24.4  01.0 - 27.2 %    Platelets 236  150 - 400 K/uL   BASIC METABOLIC PANEL     Status: Abnormal   Collection Time   09/25/12  4:04 AM      Component Value Range Comment   Sodium 137  135 - 145 mEq/L    Potassium 4.0  3.5 - 5.1 mEq/L    Chloride 105  96 - 112 mEq/L    CO2 27  19 - 32 mEq/L    Glucose, Bld 104 (*) 70 - 99 mg/dL    BUN 13  6 - 23 mg/dL    Creatinine, Ser 5.36  0.50 - 1.10 mg/dL    Calcium 8.5  8.4 - 64.4 mg/dL    GFR calc non Af Amer >90  >90 mL/min    GFR calc Af Amer >90  >90 mL/min     Radiology/Results: No results found.  Anti-infectives: Anti-infectives     Start     Dose/Rate Route  Frequency Ordered Stop   09/24/12 1200   cefOXitin (MEFOXIN) 1 g in dextrose 5 % 50 mL IVPB        1 g 100 mL/hr over 30 Minutes Intravenous 4 times per day 09/24/12 1129 09/24/12 1241   09/24/12 0600   cefOXitin (MEFOXIN) 2 g in dextrose 5 % 50 mL IVPB        2 g 100 mL/hr over 30 Minutes Intravenous 60 min pre-op 09/24/12 0533 09/24/12 0753          Assessment/Plan: Problem List: Patient Active Problem List  Diagnosis  . Panniculitis  . Lapband APS trucal vagotomy Jan 2008    Pain controlled.  Will try to discharge tomorrow.  Will ask for wound nurse assistance in arranging discharge and setting up home VAC 1 Day Post-Op    LOS: 1 day   Matt B. Daphine Deutscher, MD, [facility] [phone] beeper [phone]  09/25/2012 11:56 AM

## 2012-09-25 NOTE — Consult Note (Signed)
Wound care consult: Consult requested for incisional vac.  Pt has machine intact with good seal at 125cm con't suction to vac sponge which was placed over abdominal incision in the OR, according to progress notes.  Surgical team plans to leave in place and care management is arranging to have pt approved for negative pressure device prior to discharge.  [employer] literature states an  incisional vac may remain in place for a week without dressing change needed.  Will not plan to follow further unless re-consulted.  [address], RN, MSN, [employer]  [phone]

## 2012-09-25 NOTE — Care Management Note (Signed)
    Page 1 of 2   09/25/2012     2:36:46 PM   CARE MANAGEMENT NOTE 09/25/2012  Patient:  Susan Dorsey, Susan Dorsey   Account Number:  [id number]  Date Initiated:  09/25/2012  Documentation initiated by:  Lorenda Ishihara  Subjective/Objective Assessment:   51 yo female admitted s/p Abdominal panniculectomy (7 pounds ). PTA lived at home with spouse.     Action/Plan:   Arrange HH services   Anticipated DC Date:  09/27/2012   Anticipated DC Plan:  HOME W HOME HEALTH SERVICES      DC Planning Services  CM consult      PAC Choice  DURABLE MEDICAL EQUIPMENT  HOME HEALTH   Choice offered to / List presented to:  C-1 Patient   DME arranged  [facility]      DME agency  [facility]     [facility] arranged  HH-1 RN      [facility] agency  [facility]   Status of service:  Completed, signed off Medicare Important Message given?   (If response is "NO", the following Medicare IM given date fields will be blank) Date Medicare IM given:   Date Additional Medicare IM given:    Discharge Disposition:  HOME W HOME HEALTH SERVICES  Per UR Regulation:  Reviewed for med. necessity/level of care/duration of stay  If discussed at Long Length of Stay Meetings, dates discussed:    Comments:  09-25-12 Lorenda Ishihara RN CM [phone] Spoke with patient and husband at bedside. Patient is aware of need for wound vac and has spoken with insurance provider. [facility] will provide Home Health Services and support with neg pressure wound device. Patient plans for d/c in am, agreeable with the plan of care.

## 2012-09-26 LAB — CBC
HCT: 37.9 % (ref 36.0–46.0)
Hemoglobin: 12.4 g/dL (ref 12.0–15.0)
MCH: 29.6 pg (ref 26.0–34.0)
MCHC: 32.7 g/dL (ref 30.0–36.0)
MCV: 90.5 fL (ref 78.0–100.0)
Platelets: 228 10*3/uL (ref 150–400)
RBC: 4.19 MIL/uL (ref 3.87–5.11)
RDW: 14.2 % (ref 11.5–15.5)
WBC: 9.7 10*3/uL (ref 4.0–10.5)

## 2012-09-26 LAB — BASIC METABOLIC PANEL
BUN: 13 mg/dL (ref 6–23)
CO2: 27 mEq/L (ref 19–32)
Calcium: 8.6 mg/dL (ref 8.4–10.5)
Chloride: 104 mEq/L (ref 96–112)
Creatinine, Ser: 0.74 mg/dL (ref 0.50–1.10)
GFR calc Af Amer: >90 mL/min (ref >90)
GFR calc non Af Amer: >90 mL/min (ref >90)
Glucose, Bld: 85 mg/dL (ref 70–99)
Potassium: 4 mEq/L (ref 3.5–5.1)
Sodium: 138 mEq/L (ref 135–145)

## 2012-09-26 MED ORDER — OXYCODONE-ACETAMINOPHEN 5-325 MG PO TABS: 1.0000 | ORAL_TABLET | ORAL | Status: DC | PRN

## 2012-09-26 NOTE — Discharge Summary (Signed)
Physician Discharge Summary  Patient ID: Susan Dorsey MRN: [id number] DOB/AGE: 02/18/1961 51 y.o.  Admit date: 09/24/2012 Discharge date: 09/26/2012  Admission Diagnoses:  panniculitis  Discharge Diagnoses:  Post panniculectomy  Active Problems:  * No active hospital problems. *    Surgery:  Abdominal panniculectomy  Discharged Condition: improved  Hospital Course:   Had panniculectomy with incisional VAC.  Minimal pain with Exparel injection  Consults: wound service to continue VAC at home  Significant Diagnostic Studies: none    Discharge Exam: Blood pressure 127/7, pulse 62, temperature 98.1 F (36.7 C), temperature source Oral, resp. rate 18, height 5\' 6"  (1.676 m), weight 243 lb (110.224 kg), SpO2 100.00%. VAC in place.  No pain or redness.   Disposition:   Discharge Orders    Future Appointments: Provider: Department: Dept Phone: Center:   10/05/2012 2:30 PM Valarie Merino, MD [facility] [phone] None     Future Orders Please Complete By Expires   Diet - low sodium heart healthy      Increase activity slowly      Discharge instructions      Comments:   Continue incisional wound VAC at home   Call MD for:  redness, tenderness, or signs of infection (pain, swelling, redness, odor or green/yellow discharge around incision site)          Medication List     As of 09/26/2012  8:40 AM    TAKE these medications         calcium-vitamin D 500-200 MG-UNIT per tablet   Commonly known as: OSCAL WITH D   Take 2 tablets by mouth 2 (two) times daily.      cetirizine 10 MG tablet   Commonly known as: ZYRTEC   Take 10 mg by mouth daily.      Cinnamon 500 MG capsule   Take 500 mg by mouth daily.      clotrimazole-betamethasone cream   Commonly known as: LOTRISONE   Apply 1 application topically 2 (two) times daily as needed. rash      ferrous sulfate 325 (65 FE) MG tablet   Take 325 mg by mouth daily with breakfast.      fluticasone 50 MCG/ACT  nasal spray   Commonly known as: FLONASE   Place 2 sprays into the nose daily.      LOW-OGESTREL 0.3-30 MG-MCG tablet   Generic drug: norgestrel-ethinyl estradiol   Take 1 tablet by mouth daily.      Magnesium 250 MG Tabs   Take 1 tablet by mouth daily.      multivitamin with minerals Tabs   Take 1 tablet by mouth daily.      oxyCODONE-acetaminophen 5-325 MG per tablet   Commonly known as: PERCOCET/ROXICET   Take 1-2 tablets by mouth every 4 (four) hours as needed.           Follow-up Information    Follow up with [facility]. (neg pressure wound device, RN)    Contact information:   [address] [place] [address] [phone]         Signed: Valarie Merino 09/26/2012, 8:40 AM

## 2012-09-26 NOTE — Progress Notes (Signed)
Assisted by assistant manger Lelon Perla, RN to place [facility] on [facility] wound vac, multiple attempts to fix leak but pateint eagar to go home and [facility] RN to be there at 1700,

## 2012-09-26 NOTE — Progress Notes (Signed)
Patient has prescription for percocet and understands discharge instrcutions, i have spoken with [facility] who will be delivering portable wound vac to hospital, Dr. Daphine Deutscher stated this morning he does not want wound vac changed before patient goes home. Order in computer not to change wound vac until order from [facility].

## 2012-09-26 NOTE — Progress Notes (Signed)
Discharged via wheelchair.

## 2012-10-01 ENCOUNTER — Telehealth (INDEPENDENT_AMBULATORY_CARE_PROVIDER_SITE_OTHER): Payer: Self-pay

## 2012-10-01 NOTE — Telephone Encounter (Signed)
[facility] will need depth and length measurements of pt's wound for authorization of wound vac.  Pls call Shay at [facility] with this information after pt's appt on 10/05/12 with Dr. Daphine Deutscher.

## 2012-10-05 ENCOUNTER — Ambulatory Visit (INDEPENDENT_AMBULATORY_CARE_PROVIDER_SITE_OTHER): Payer: 59 | Admitting: Surgery

## 2012-10-05 ENCOUNTER — Encounter (INDEPENDENT_AMBULATORY_CARE_PROVIDER_SITE_OTHER): Payer: Self-pay | Admitting: Surgery

## 2012-10-05 VITALS — BP 146/94 | HR 68 | Temp 98.4°F | Resp 16 | Ht 66.0 in | Wt 249.8 lb

## 2012-10-05 DIAGNOSIS — M793 Panniculitis, unspecified: Secondary | ICD-10-CM

## 2012-10-05 NOTE — Patient Instructions (Addendum)
Thanks for your patience.  If you need further assistance after leaving the office, please call our office and speak with a [facility] nurse.  [phone].  If you want to leave a message for Dr. Exa Bomba, please call his office phone at [phone]. 

## 2012-10-05 NOTE — Progress Notes (Signed)
Susan Dorsey 51 y.o.  Body mass index is 40.32 kg/(m^2).  Patient Active Problem List  Diagnosis  . Panniculitis  . Lapband APS trucal vagotomy Jan 2008    Allergies  Allergen Reactions  . Aspirin Swelling    Lips only.  . Chocolate   . Gluten Meal     Past Surgical History  Procedure Date  . Tonsilectomy, adenoidectomy, bilateral myringotomy and tubes 1972  . Laparoscopic gastric banding 01/23/2007  . Cholecystectomy 1990's    Patient unsure of date  . Panniculectomy 09/24/2012    Procedure: PANNICULECTOMY;  Surgeon: Valarie Merino, MD;  Location: [facility];  Service: General;  Laterality: N/A;   Alva Garnet., MD No diagnosis found.  Incisional VAC removed and wound looks good.  [facility] has not done well with after discharge support.  Will bring her back Monday for staple removal.   Matt B. Daphine Deutscher, MD, [facility] [phone] beeper [phone]  10/05/2012 3:42 PM

## 2012-10-08 ENCOUNTER — Encounter (INDEPENDENT_AMBULATORY_CARE_PROVIDER_SITE_OTHER): Payer: Self-pay

## 2012-10-08 ENCOUNTER — Ambulatory Visit (INDEPENDENT_AMBULATORY_CARE_PROVIDER_SITE_OTHER): Payer: 59 | Admitting: General Surgery

## 2012-10-08 VITALS — BP 140/90 | HR 64 | Temp 98.9°F | Resp 16 | Ht 66.0 in | Wt 248.2 lb

## 2012-10-08 DIAGNOSIS — Z4802 Encounter for removal of sutures: Secondary | ICD-10-CM

## 2012-10-11 ENCOUNTER — Ambulatory Visit (INDEPENDENT_AMBULATORY_CARE_PROVIDER_SITE_OTHER): Payer: 59 | Admitting: Surgery

## 2012-10-11 DIAGNOSIS — Z9889 Other specified postprocedural states: Secondary | ICD-10-CM

## 2012-10-11 NOTE — Progress Notes (Signed)
Sutures removed today.  Wound looks good.  Will see back in 3 weeks.  Doing well

## 2012-11-02 ENCOUNTER — Encounter (INDEPENDENT_AMBULATORY_CARE_PROVIDER_SITE_OTHER): Payer: Self-pay | Admitting: Surgery

## 2012-11-02 ENCOUNTER — Ambulatory Visit (INDEPENDENT_AMBULATORY_CARE_PROVIDER_SITE_OTHER): Payer: 59 | Admitting: Surgery

## 2012-11-02 VITALS — BP 120/80 | HR 88 | Temp 98.1°F | Resp 16 | Ht 66.0 in | Wt 255.0 lb

## 2012-11-02 DIAGNOSIS — Z9889 Other specified postprocedural states: Secondary | ICD-10-CM

## 2012-11-02 NOTE — Progress Notes (Signed)
Susan Dorsey 51 y.o.  Body mass index is 41.16 kg/(m^2).  Patient Active Problem List  Diagnosis  . Panniculitis  . Lapband APS trucal vagotomy Jan 2008    Allergies  Allergen Reactions  . Aspirin Swelling    Lips only.  . Chocolate   . Gluten Meal     Past Surgical History  Procedure Date  . Tonsilectomy, adenoidectomy, bilateral myringotomy and tubes 1972  . Laparoscopic gastric banding 01/23/2007  . Cholecystectomy 1990's    Patient unsure of date  . Panniculectomy 09/24/2012    Procedure: PANNICULECTOMY;  Surgeon: Valarie Merino, MD;  Location: [facility];  Service: General;  Laterality: N/A;   Alva Garnet., MD No diagnosis found.  [name]'s incisions looks good.  She is doing well.  She will return to work next Tuesday.  She may donate plasma.  I will see her in 8 weeks Matt B. Daphine Deutscher, MD, [facility] [phone] beeper [phone]  11/02/2012 5:38 PM

## 2012-11-02 NOTE — Patient Instructions (Addendum)
Thanks for your patience.  If you need further assistance after leaving the office, please call our office and speak with a [facility] nurse.  [phone].  If you want to leave a message for Dr. Dario Yono, please call his office phone at [phone]. 

## 2012-11-14 ENCOUNTER — Other Ambulatory Visit (INDEPENDENT_AMBULATORY_CARE_PROVIDER_SITE_OTHER): Payer: Self-pay | Admitting: Surgery

## 2012-11-14 ENCOUNTER — Telehealth (INDEPENDENT_AMBULATORY_CARE_PROVIDER_SITE_OTHER): Payer: Self-pay | Admitting: General Surgery

## 2012-11-14 DIAGNOSIS — Z9889 Other specified postprocedural states: Secondary | ICD-10-CM

## 2012-11-14 NOTE — Telephone Encounter (Signed)
Spoke with pt and informed her that we referred her to Dr. Leonie Green office and that their office will contact her about setting her appt up.

## 2013-01-01 ENCOUNTER — Ambulatory Visit (INDEPENDENT_AMBULATORY_CARE_PROVIDER_SITE_OTHER): Payer: 59 | Admitting: Surgery

## 2013-01-01 ENCOUNTER — Encounter (INDEPENDENT_AMBULATORY_CARE_PROVIDER_SITE_OTHER): Payer: Self-pay | Admitting: Surgery

## 2013-01-01 VITALS — BP 142/76 | HR 76 | Temp 97.2°F | Resp 20 | Ht 66.0 in | Wt 267.6 lb

## 2013-01-01 DIAGNOSIS — Z9889 Other specified postprocedural states: Secondary | ICD-10-CM

## 2013-01-01 NOTE — Patient Instructions (Signed)

## 2013-01-01 NOTE — Progress Notes (Signed)
Susan Dorsey Body mass index is 43.19 kg/(m^2).  Having regurgitation:  no  Nocturnal reflux?  no  Amount of fill  0.25 Postop visit for panniculectomy.  Doing well  Incision healed.   Gave fill Return 2 months

## 2013-01-04 ENCOUNTER — Encounter (INDEPENDENT_AMBULATORY_CARE_PROVIDER_SITE_OTHER): Payer: 59 | Admitting: General Surgery

## 2013-01-29 ENCOUNTER — Telehealth (INDEPENDENT_AMBULATORY_CARE_PROVIDER_SITE_OTHER): Payer: Self-pay

## 2013-01-29 NOTE — Telephone Encounter (Signed)
Spoke with pt letting her know that her appt time for March 21st has been changed to 1020a

## 2013-03-15 ENCOUNTER — Encounter (INDEPENDENT_AMBULATORY_CARE_PROVIDER_SITE_OTHER): Payer: Self-pay | Admitting: Surgery

## 2013-03-15 ENCOUNTER — Ambulatory Visit (INDEPENDENT_AMBULATORY_CARE_PROVIDER_SITE_OTHER): Payer: 59 | Admitting: Surgery

## 2013-03-15 VITALS — BP 128/88 | HR 76 | Temp 97.4°F | Resp 16 | Ht 66.0 in | Wt 259.6 lb

## 2013-03-15 DIAGNOSIS — Z9884 Bariatric surgery status: Secondary | ICD-10-CM

## 2013-03-15 NOTE — Progress Notes (Signed)
Lapband Fill Encounter Problem List:   Patient Active Problem List  Diagnosis  . Panniculitis  . Lapband APS trucal vagotomy Jan 2008  . Status post panniculectomy Sept 2013    Susan Dorsey Body mass index is 41.92 kg/(m^2). Weight loss since surgery  112    Having regurgitation?:  Slight at night  Feel that they need a fill?  No-  Nocturnal reflux?  occasional  Amount of fill  0   Port site: ok  Instructions given and weight loss goals discussed.  We discussed her use of Prilosec and occasional gerd at night.  She is borderline tight and wants to work with it.  If it gets too bad then we will remove some fluid from her band.  Her panniculectomy has healed nicely and she will begin getting more exercise.    Return 4 months  Matt B. Daphine Deutscher, MD, FACS

## 2013-03-15 NOTE — Patient Instructions (Signed)
Increase exercise. Come back sooner if GERD gets too severe

## 2013-04-22 ENCOUNTER — Encounter (INDEPENDENT_AMBULATORY_CARE_PROVIDER_SITE_OTHER): Payer: Self-pay

## 2013-04-22 ENCOUNTER — Telehealth (INDEPENDENT_AMBULATORY_CARE_PROVIDER_SITE_OTHER): Payer: Self-pay | Admitting: General Surgery

## 2013-04-22 NOTE — Telephone Encounter (Signed)
Patient calling for an appt with Dr Daphine Deutscher. Upon further discussion- what she really needs is a note from him. Her work schedule has changed and she is now not able to get in the amount of protein she needs for her diet due to this new schedule. She is wanting a note from Dr Daphine Deutscher stating it is medically necessary to go back to her old schedule. She can come in for an appt if he needs to discuss this with her. Please call back at [phone].

## 2013-05-24 ENCOUNTER — Encounter (INDEPENDENT_AMBULATORY_CARE_PROVIDER_SITE_OTHER): Payer: Self-pay | Admitting: Surgery

## 2013-07-10 ENCOUNTER — Ambulatory Visit (INDEPENDENT_AMBULATORY_CARE_PROVIDER_SITE_OTHER): Payer: 59 | Admitting: Surgery

## 2013-07-10 ENCOUNTER — Encounter (INDEPENDENT_AMBULATORY_CARE_PROVIDER_SITE_OTHER): Payer: Self-pay | Admitting: Surgery

## 2013-07-10 VITALS — BP 142/86 | HR 76 | Resp 14 | Ht 67.0 in | Wt 253.8 lb

## 2013-07-10 DIAGNOSIS — Z9884 Bariatric surgery status: Secondary | ICD-10-CM

## 2013-07-10 NOTE — Patient Instructions (Addendum)
Thanks for your patience.  If you need further assistance after leaving the office, please call our office and speak with a [facility] nurse.  [phone].  If you want to leave a message for Dr. Jaclyn Andy, please call his office phone at [phone]. 

## 2013-07-10 NOTE — Progress Notes (Signed)
Susan Dorsey 52 y.o.  Body mass index is 39.74 kg/(m^2).  Patient Active Problem List   Diagnosis Date Noted  . Status post panniculectomy Sept 2013 11/02/2012  . Lapband APS trucal vagotomy Jan 2008 05/30/2012  . Panniculitis 08/19/2011    Allergies  Allergen Reactions  . Aspirin Swelling    Lips only.  . Chocolate   . Gluten Meal     Past Surgical History  Procedure Laterality Date  . Tonsilectomy, adenoidectomy, bilateral myringotomy and tubes  1972  . Laparoscopic gastric banding  01/23/2007  . Cholecystectomy  1990's    Patient unsure of date  . Panniculectomy  09/24/2012    Procedure: PANNICULECTOMY;  Surgeon: Valarie Merino, MD;  Location: [facility];  Service: General;  Laterality: N/A;   Susan Garnet., MD No diagnosis found.  Susan Dorsey is doing very well. She has gone back on her work schedule where she was able to control her diet. She's getting some exercise and overall looks good. Her level of restriction is where she wants it. I think we can leave room air and she knows what to do. I will put her down to see back in about 6 months although that could be sooner or later and I will leave that up to her.   Susan B. Daphine Deutscher, MD, [facility] [phone] beeper [phone]  07/10/2013 4:32 PM

## 2013-09-24 ENCOUNTER — Encounter (INDEPENDENT_AMBULATORY_CARE_PROVIDER_SITE_OTHER): Payer: Self-pay

## 2013-12-11 ENCOUNTER — Encounter (INDEPENDENT_AMBULATORY_CARE_PROVIDER_SITE_OTHER): Payer: Self-pay | Admitting: Surgery

## 2014-01-23 ENCOUNTER — Encounter (INDEPENDENT_AMBULATORY_CARE_PROVIDER_SITE_OTHER): Payer: Self-pay

## 2014-02-03 ENCOUNTER — Encounter (INDEPENDENT_AMBULATORY_CARE_PROVIDER_SITE_OTHER): Payer: Self-pay

## 2014-04-16 ENCOUNTER — Ambulatory Visit (INDEPENDENT_AMBULATORY_CARE_PROVIDER_SITE_OTHER): Payer: 59 | Admitting: Surgery

## 2014-04-16 ENCOUNTER — Encounter (INDEPENDENT_AMBULATORY_CARE_PROVIDER_SITE_OTHER): Payer: Self-pay | Admitting: Surgery

## 2014-04-16 VITALS — BP 122/68 | HR 73 | Temp 97.5°F | Ht 66.0 in | Wt 244.4 lb

## 2014-04-16 DIAGNOSIS — Z9884 Bariatric surgery status: Secondary | ICD-10-CM

## 2014-04-16 NOTE — Progress Notes (Signed)
Lapband Fill Encounter Problem List:   Patient Active Problem List   Diagnosis Date Noted  . Status post panniculectomy Sept 2013 11/02/2012  . Lapband APS trucal vagotomy Jan 2008 05/30/2012  . Panniculitis 08/19/2011    Susan Dorsey Body mass index is 39.47 kg/(m^2). Weight loss since surgery  127.6   Having regurgitation?:  no  Feel that they need a fill?  no  Nocturnal reflux?  No--has some occasional reflux and takes Prilosec prn.   Amount of fill  0     Instructions given and weight loss goals discussed.    Has some panniculus pinching with bending over and placing mulch.  No issues.  Susan Dorsey has done well.   Matt B. Daphine DeutscherMartin, MD, FACS

## 2014-04-16 NOTE — Patient Instructions (Signed)
Thanks for your patience.  If you need further assistance after leaving the office, please call our office and speak with a [facility] nurse.  [phone].  If you want to leave a message for Dr. Julanne Schlueter, please call his office phone at [phone]. 

## 2015-09-17 ENCOUNTER — Other Ambulatory Visit: Payer: Self-pay | Admitting: Surgery

## 2015-09-17 DIAGNOSIS — R1310 Dysphagia, unspecified: Secondary | ICD-10-CM

## 2015-09-23 ENCOUNTER — Ambulatory Visit
Admission: RE | Admit: 2015-09-23 | Discharge: 2015-09-23 | Disposition: A | Discharge status: Home / Self Care | Payer: Commercial Managed Care - HMO | Source: Ambulatory Visit | Attending: Surgery | Admitting: Surgery

## 2016-10-20 ENCOUNTER — Ambulatory Visit: Payer: Commercial Managed Care - HMO | Admitting: Podiatry

## 2017-02-16 DIAGNOSIS — E785 Hyperlipidemia, unspecified: Secondary | ICD-10-CM | POA: Diagnosis not present

## 2017-04-04 DIAGNOSIS — Z719 Counseling, unspecified: Secondary | ICD-10-CM | POA: Diagnosis not present

## 2017-04-11 DIAGNOSIS — Z719 Counseling, unspecified: Secondary | ICD-10-CM | POA: Diagnosis not present

## 2017-04-18 DIAGNOSIS — Z719 Counseling, unspecified: Secondary | ICD-10-CM | POA: Diagnosis not present

## 2017-04-25 DIAGNOSIS — Z719 Counseling, unspecified: Secondary | ICD-10-CM | POA: Diagnosis not present

## 2017-05-02 DIAGNOSIS — Z719 Counseling, unspecified: Secondary | ICD-10-CM | POA: Diagnosis not present

## 2017-05-09 DIAGNOSIS — Z719 Counseling, unspecified: Secondary | ICD-10-CM | POA: Diagnosis not present

## 2017-05-16 DIAGNOSIS — Z719 Counseling, unspecified: Secondary | ICD-10-CM | POA: Diagnosis not present

## 2017-05-23 DIAGNOSIS — Z719 Counseling, unspecified: Secondary | ICD-10-CM | POA: Diagnosis not present

## 2017-05-30 DIAGNOSIS — Z719 Counseling, unspecified: Secondary | ICD-10-CM | POA: Diagnosis not present

## 2017-06-08 DIAGNOSIS — Z719 Counseling, unspecified: Secondary | ICD-10-CM | POA: Diagnosis not present

## 2017-06-13 DIAGNOSIS — Z719 Counseling, unspecified: Secondary | ICD-10-CM | POA: Diagnosis not present

## 2017-06-20 DIAGNOSIS — Z719 Counseling, unspecified: Secondary | ICD-10-CM | POA: Diagnosis not present

## 2017-06-27 DIAGNOSIS — Z719 Counseling, unspecified: Secondary | ICD-10-CM | POA: Diagnosis not present

## 2017-07-05 ENCOUNTER — Encounter (INDEPENDENT_AMBULATORY_CARE_PROVIDER_SITE_OTHER): Payer: Commercial Managed Care - HMO | Admitting: Family Medicine

## 2017-07-05 DIAGNOSIS — Z0289 Encounter for other administrative examinations: Secondary | ICD-10-CM

## 2017-07-14 ENCOUNTER — Other Ambulatory Visit (INDEPENDENT_AMBULATORY_CARE_PROVIDER_SITE_OTHER): Payer: Self-pay | Admitting: Family Medicine

## 2017-07-14 ENCOUNTER — Ambulatory Visit (INDEPENDENT_AMBULATORY_CARE_PROVIDER_SITE_OTHER): Payer: 59 | Admitting: Family Medicine

## 2017-07-14 ENCOUNTER — Encounter (INDEPENDENT_AMBULATORY_CARE_PROVIDER_SITE_OTHER): Payer: Self-pay | Admitting: Family Medicine

## 2017-07-14 VITALS — BP 144/82 | HR 59 | Temp 98.5°F | Resp 12 | Ht 66.0 in | Wt 248.0 lb

## 2017-07-14 DIAGNOSIS — Z1389 Encounter for screening for other disorder: Secondary | ICD-10-CM

## 2017-07-14 DIAGNOSIS — Z1331 Encounter for screening for depression: Secondary | ICD-10-CM

## 2017-07-14 DIAGNOSIS — E538 Deficiency of other specified B group vitamins: Secondary | ICD-10-CM | POA: Diagnosis not present

## 2017-07-14 DIAGNOSIS — R5383 Other fatigue: Secondary | ICD-10-CM | POA: Diagnosis not present

## 2017-07-14 DIAGNOSIS — R0609 Other forms of dyspnea: Secondary | ICD-10-CM | POA: Diagnosis not present

## 2017-07-14 DIAGNOSIS — Z6841 Body Mass Index (BMI) 40.0 and over, adult: Secondary | ICD-10-CM | POA: Diagnosis not present

## 2017-07-14 DIAGNOSIS — E669 Obesity, unspecified: Secondary | ICD-10-CM | POA: Diagnosis not present

## 2017-07-14 DIAGNOSIS — R0600 Dyspnea, unspecified: Secondary | ICD-10-CM | POA: Insufficient documentation

## 2017-07-14 DIAGNOSIS — E559 Vitamin D deficiency, unspecified: Secondary | ICD-10-CM

## 2017-07-14 DIAGNOSIS — IMO0001 Reserved for inherently not codable concepts without codable children: Secondary | ICD-10-CM

## 2017-07-15 LAB — COMPREHENSIVE METABOLIC PANEL
ALT: 30 IU/L (ref 0–32)
AST: 25 IU/L (ref 0–40)
Albumin/Globulin Ratio: 1.8 (ref 1.2–2.2)
Albumin: 4.2 g/dL (ref 3.5–5.5)
Alkaline Phosphatase: 62 IU/L (ref 39–117)
BUN/Creatinine Ratio: 10 (ref 9–23)
BUN: 9 mg/dL (ref 6–24)
Bilirubin Total: 0.5 mg/dL (ref 0.0–1.2)
CO2: 23 mmol/L (ref 20–29)
Calcium: 9.2 mg/dL (ref 8.7–10.2)
Chloride: 104 mmol/L (ref 96–106)
Creatinine, Ser: 0.89 mg/dL (ref 0.57–1.00)
GFR calc Af Amer: 85 mL/min/{1.73_m2} (ref >59)
GFR calc non Af Amer: 74 mL/min/{1.73_m2} (ref >59)
Globulin, Total: 2.3 g/dL (ref 1.5–4.5)
Glucose: 76 mg/dL (ref 65–99)
Potassium: 4.7 mmol/L (ref 3.5–5.2)
Sodium: 141 mmol/L (ref 134–144)
Total Protein: 6.5 g/dL (ref 6.0–8.5)

## 2017-07-15 LAB — TSH: TSH: 2.56 u[IU]/mL (ref 0.450–4.500)

## 2017-07-15 LAB — T3: T3, Total: 132 ng/dL (ref 71–180)

## 2017-07-15 LAB — CBC WITH DIFFERENTIAL
Basophils Absolute: 0 10*3/uL (ref 0.0–0.2)
Basos: 0 %
EOS (ABSOLUTE): 0.1 10*3/uL (ref 0.0–0.4)
Eos: 0 %
Hematocrit: 41.1 % (ref 34.0–46.6)
Hemoglobin: 13.1 g/dL (ref 11.1–15.9)
Immature Grans (Abs): 0 10*3/uL (ref 0.0–0.1)
Immature Granulocytes: 0 %
Lymphocytes Absolute: 1.5 10*3/uL (ref 0.7–3.1)
Lymphs: 13 %
MCH: 28.2 pg (ref 26.6–33.0)
MCHC: 31.9 g/dL (ref 31.5–35.7)
MCV: 89 fL (ref 79–97)
Monocytes Absolute: 0.7 10*3/uL (ref 0.1–0.9)
Monocytes: 6 %
Neutrophils Absolute: 9.4 10*3/uL — ABNORMAL HIGH (ref 1.4–7.0)
Neutrophils: 81 %
RBC: 4.64 x10E6/uL (ref 3.77–5.28)
RDW: 16.2 % — ABNORMAL HIGH (ref 12.3–15.4)
WBC: 11.7 10*3/uL — ABNORMAL HIGH (ref 3.4–10.8)

## 2017-07-15 LAB — T4, FREE: Free T4: 1.49 ng/dL (ref 0.82–1.77)

## 2017-07-15 LAB — VITAMIN D 25 HYDROXY (VIT D DEFICIENCY, FRACTURES): Vit D, 25-Hydroxy: 39.9 ng/mL (ref 30.0–100.0)

## 2017-07-15 LAB — LIPID PANEL WITH LDL/HDL RATIO
Cholesterol, Total: 196 mg/dL (ref 100–199)
HDL: 42 mg/dL (ref >39)
LDL Calculated: 134 mg/dL — ABNORMAL HIGH (ref 0–99)
LDl/HDL Ratio: 3.2 ratio (ref 0.0–3.2)
Triglycerides: 101 mg/dL (ref 0–149)
VLDL Cholesterol Cal: 20 mg/dL (ref 5–40)

## 2017-07-15 LAB — VITAMIN B12: Vitamin B-12: 985 pg/mL (ref 232–1245)

## 2017-07-15 LAB — FOLATE: Folate: >20 ng/mL (ref >3.0)

## 2017-07-15 LAB — INSULIN, RANDOM: INSULIN: 6 u[IU]/mL (ref 2.6–24.9)

## 2017-07-15 LAB — HEMOGLOBIN A1C
Est. average glucose Bld gHb Est-mCnc: 100 mg/dL
Hgb A1c MFr Bld: 5.1 % (ref 4.8–5.6)

## 2017-07-17 NOTE — Progress Notes (Signed)
Office: [phone]  /  Fax: [phone]   Dear Andrey Campanile,   Thank you for referring Susan Dorsey to our clinic. The following note includes my evaluation and treatment recommendations.  HPI:   Chief Complaint: OBESITY    Susan Dorsey has been referred by Mary Sella. Andrey Campanile, MD for consultation regarding her obesity and obesity related comorbidities.    Susan Dorsey (MR# [id number]) is a 56 y.o. female who presents on 07/14/2017 for obesity evaluation and treatment. Current BMI is Body mass index is 40.03 kg/m.Marland Kitchen Susan Dorsey had Lap Band in 2008. Susan Dorsey heaviest pre-operative weight was 380 lbs and she lost approximately 100 lbs after surgery and is now starting to creep up.     Susan Dorsey attended our information session and states she is currently in the action stage of change and ready to dedicate time achieving and maintaining a healthier weight. Susan Dorsey requests to join our Back-On-Track program to help manage their weight and relearn how to use their weight loss surgery to achieve improved health.  Susan Dorsey states her family eats meals together she thinks her family will eat healthier with  her her desired weight loss is 52 lbs she has been heavy most of  her life she started gaining weight in 10 th grade (1979) her heaviest weight ever was 380 lbs. she has significant food cravings issues  she snacks frequently in the evenings she wakes up frquently in the middle of the night to eat she is frequently drinking liquids with calories she frequently makes poor food choices she has binge eating behaviors she struggles with emotional eating    Susan Dorsey feels her energy is lower than it should be. This has worsened with weight gain and has not worsened recently. Susan Dorsey denies daytime somnolence and  denies waking up still tired. Patient is at risk for obstructive sleep apnea. Patient generally gets 8 hours of sleep per night, and states they generally have restful sleep. Snoring is present. Apneic episodes  are not present. Epworth Sleepiness Score is 1  Dyspnea on exertion Deshonda notes increasing shortness of breath with exercising and seems to be worsening over time with weight gain. She notes getting out of breath sooner with activity than she used to. This has not gotten worse recently. Susan Dorsey denies orthopnea.  Vitamin D deficiency Susan Dorsey has a diagnosis of vitamin D deficiency. She is currently taking OTC vit D and still notes Susan. She denies nausea, vomiting or muscle weakness.  B12 Deficiency Susan Dorsey has a diagnosis of B12 insufficiency and notes Susan. This is not a new diagnosis. Susan Dorsey is not a vegetarian and does have a previous history of anemia. She has a history of weight loss surgery.    Depression Screen Susan Dorsey Food and Mood (modified PHQ-9) score was  Depression screen PHQ 2/9 07/14/2017  Decreased Interest 1  Down, Depressed, Hopeless 0  PHQ - 2 Score 1  Altered sleeping 1  Tired, decreased energy 1  Change in appetite 1  Feeling bad or failure about yourself  0  Trouble concentrating 0  Moving slowly or fidgety/restless 0  Suicidal thoughts 0  PHQ-9 Score 4    ALLERGIES: Allergies  Allergen Reactions  . Aspirin Swelling    Lips only.  . Chocolate   . Gluten Meal     MEDICATIONS: Current Outpatient Prescriptions on File Prior to Visit  Medication Sig Dispense Refill  . calcium-vitamin D (OSCAL WITH D) 500-200 MG-UNIT per tablet Take 2 tablets by mouth 2 (two) times  daily.     . cetirizine (ZYRTEC) 10 MG tablet Take 10 mg by mouth daily.     . fluticasone (FLONASE) 50 MCG/ACT nasal spray Place 2 sprays into the nose daily.     . LOW-OGESTREL 0.3-30 MG-MCG tablet Take 1 tablet by mouth daily.     . Magnesium 250 MG TABS Take 1 tablet by mouth daily.    . Multiple Vitamin (MULTIVITAMIN WITH MINERALS) TABS Take 1 tablet by mouth daily.     No current facility-administered medications on file prior to visit.     PAST MEDICAL HISTORY: Past Medical History:    Diagnosis Date  . Allergy   . Anemia   . B12 deficiency   . Chronic kidney disease    hx of uti   . Gallbladder problem   . GERD (gastroesophageal reflux disease)   . Gluten intolerance   . Gluten intolerance   . Hyperlipidemia    borderline  . Hypertension    Resolved after LAGB  . Joint pain   . Keratoconus   . Morbid obesity (HCC)   . Obesity   . Peeling of nails   . Swallowing difficulty   . Swelling    feet and legs  . TMJ (dislocation of temporomandibular joint)   . Vitamin D deficiency   . Wears glasses   . White coat syndrome with diagnosis of hypertension     PAST SURGICAL HISTORY: Past Surgical History:  Procedure Laterality Date  . CHOLECYSTECTOMY  1990's   Patient unsure of date  . LAPAROSCOPIC GASTRIC BANDING  01/23/2007  . PANNICULECTOMY  09/24/2012   Procedure: PANNICULECTOMY;  Surgeon: Valarie Merino, MD;  Location: [facility];  Service: General;  Laterality: N/A;  . TONSILECTOMY, ADENOIDECTOMY, BILATERAL MYRINGOTOMY AND TUBES  1972    SOCIAL HISTORY: Social History  Substance Use Topics  . Smoking status: Never Smoker  . Smokeless tobacco: Never Used  . Alcohol use No    FAMILY HISTORY: Family History  Problem Relation Age of Onset  . Hypertension Mother   . Arthritis Mother   . Diabetes Mother   . Hyperlipidemia Mother   . Heart disease Mother   . Depression Mother   . Anxiety disorder Mother   . Sleep apnea Mother   . Eating disorder Mother   . Obstructive Sleep Apnea Mother   . Obesity Mother   . Cancer Father        lliver, pancreas, stomach  . Liver disease Father   . Chronic Susan Sister     ROS: Review of Systems  Constitutional: Positive for malaise/Susan.  HENT: Positive for congestion (nasal stuffiness).        Nasal Discharge Hay Fever  Eyes:       Wear Glasses or Contacts Floaters  Respiratory: Positive for shortness of breath (with exertion) and wheezing (reflux).   Cardiovascular: Negative for orthopnea.   Gastrointestinal: Positive for heartburn. Negative for nausea and vomiting.  Musculoskeletal:       Muscle or Joint Pain Negative muscle weakness  Skin: Positive for itching and rash.       Dryness Hair or Nail Changes  Endo/Heme/Allergies:       Heat/Cold Intolerance    PHYSICAL EXAM: Blood pressure (!) 144/82, pulse (!) 59, temperature 98.5 F (36.9 C), temperature source Oral, resp. rate 12, height 5\' 6"  (1.676 m), weight 248 lb (112.5 kg), last menstrual period 05/22/2017, SpO2 100 %. Body mass index is 40.03 kg/m. Physical Exam  Constitutional: She  is oriented to person, place, and time. She appears well-developed and well-nourished.  Cardiovascular: Normal rate.   Pulmonary/Chest: Effort normal.  Musculoskeletal: Normal range of motion.  Neurological: She is oriented to person, place, and time.  Skin: Skin is warm and dry.  Psychiatric: She has a normal mood and affect. Her behavior is normal.  Vitals reviewed.   RECENT LABS AND TESTS: BMET    Component Value Date/Time   NA 138 09/26/2012 0414   K 4.0 09/26/2012 0414   CL 104 09/26/2012 0414   CO2 27 09/26/2012 0414   GLUCOSE 85 09/26/2012 0414   BUN 13 09/26/2012 0414   CREATININE 0.74 09/26/2012 0414   CALCIUM 8.6 09/26/2012 0414   GFRNONAA >90 09/26/2012 0414   GFRAA >90 09/26/2012 0414   No results found for: HGBA1C No results found for: INSULIN CBC    Component Value Date/Time   WBC 9.7 09/26/2012 0414   RBC 4.19 09/26/2012 0414   HGB 12.4 09/26/2012 0414   HCT 37.9 09/26/2012 0414   PLT 228 09/26/2012 0414   MCV 90.5 09/26/2012 0414   MCH 29.6 09/26/2012 0414   MCHC 32.7 09/26/2012 0414   RDW 14.2 09/26/2012 0414   Iron/TIBC/Ferritin/ %Sat No results found for: IRON, TIBC, FERRITIN, IRONPCTSAT Lipid Panel  No results found for: CHOL, TRIG, HDL, CHOLHDL, VLDL, LDLCALC, LDLDIRECT Hepatic Function Panel  No results found for: PROT, ALBUMIN, AST, ALT, ALKPHOS, BILITOT, BILIDIR, IBILI No results  found for: TSH  ECG  shows NSR with a rate of 40.1 BPM INDIRECT CALORIMETER done today shows a VO2 of 270 and a REE of 1879.    ASSESSMENT AND PLAN: Other Susan - Plan: EKG 12-Lead, Comprehensive metabolic panel, CBC With Differential, Hemoglobin A1c, Insulin, random, Lipid Panel With LDL/HDL Ratio, TSH, T4, free, T3  Dyspnea on exertion  Vitamin D deficiency - Plan: VITAMIN D 25 Hydroxy (Vit-D Deficiency, Fractures)  B12 deficiency - Plan: Vitamin B12, Folate  Screening for depression  Class 3 obesity without serious comorbidity with body mass index (BMI) of 40.0 to 44.9 in adult, unspecified obesity type (HCC)  PLAN: Susan Susan Dorsey was informed that her Susan may be related to obesity, depression or many other causes. Labs will be ordered, and in the meanwhile Susan Dorsey has agreed to work on diet, exercise and weight loss to help with Susan. Proper sleep hygiene was discussed including the need for 7-8 hours of quality sleep each night. A sleep study was not ordered based on symptoms and Epworth score.  Dyspnea on exertion Susan Dorsey shortness of breath appears to be obesity related and exercise induced. She has agreed to work on weight loss and gradually increase exercise to treat her exercise induced shortness of breath. If Marquasia follows our instructions and loses weight without improvement of her shortness of breath, we will plan to refer to pulmonology. We will monitor this condition regularly. Aaleeyah agrees to this plan.  Vitamin D Deficiency Susan Dorsey was informed that low vitamin D levels contributes to Susan and are associated with obesity, breast, and colon cancer. She agrees to continue to take OTC vit D and we will check labs and will follow up for routine testing of vitamin D, at least 2-3 times per year. She was informed of the risk of over-replacement of vitamin D and agrees to not increase her dose unless he discusses this with Susan Dorsey first. Jesslynn agrees to follow up with our clinic in 1  week.  B12 Deficiency Susan Dorsey will work on increasing B12  rich foods in her diet. B12 supplementation was not prescribed today. We will check labs and Jara agrees to follow up with our clinic in 1 week.  Depression Screen Susan Dorsey had a negative depression screening. Depression is commonly associated with obesity and often results in emotional eating behaviors. We will monitor this closely and work on CBT to help improve the non-hunger eating patterns.   Obesity Susan Dorsey is currently in the action stage of change and her goal is to continue with weight loss efforts. I recommend Emagene begin the structured treatment plan as follows:  She has agreed to keep a food journal with 1300 to 1500 calories and 80+ grams of protein  Birdie has been instructed to eventually work up to a goal of 150 minutes of combined cardio and strengthening exercise per week for weight loss and overall health benefits. We discussed the following Behavioral Modification Strategies today: increasing lean protein intake and keep a strict food journal  Meriam has agreed to join our [facility] program and follow up with our clinic in 1 week. She was informed of the importance of frequent follow up visits to maximize her success with intensive lifestyle modifications for her multiple health conditions. She was informed we would discuss her lab results at her next visit unless there is a critical issue that needs to be addressed sooner. Edia agreed to keep her next visit at the agreed upon time to discuss these results.  I, Nevada Crane, am acting as transcriptionist for Quillian Quince, MD  I have reviewed the above documentation for accuracy and completeness, and I agree with the above. -Quillian Quince, MD   OBESITY BEHAVIORAL INTERVENTION VISIT  Today's visit was # 1 out of 22.  Starting weight: 248 lbs Starting date: 07/14/17 Today's weight : 248 lbs Today's date: 07/14/2017 Total lbs lost to date: 0 (Patients must lose 7 lbs in the first  6 months to continue with counseling)   ASK: We discussed the diagnosis of obesity with Madisen Jarome Matin today and Akasia agreed to give [place] permission to discuss obesity behavioral modification therapy today.  ASSESS: Davionne has the diagnosis of obesity and her BMI today is 40.1 Judiann is in the action stage of change   ADVISE: Oluchi was educated on the multiple health risks of obesity as well as the benefit of weight loss to improve her health. She was advised of the need for long term treatment and the importance of lifestyle modifications.  AGREE: Multiple dietary modification options and treatment options were discussed and  Bionca agreed to keep a food journal with 1300 to 1500 calories and 80+ grams of protein  We discussed the following Behavioral Modification Strategies today: increasing lean protein intake and keep a strict food journal

## 2017-07-21 ENCOUNTER — Ambulatory Visit (INDEPENDENT_AMBULATORY_CARE_PROVIDER_SITE_OTHER): Payer: 59 | Admitting: Family Medicine

## 2017-07-21 VITALS — BP 143/85 | HR 67 | Temp 98.3°F | Ht 66.0 in | Wt 241.0 lb

## 2017-07-21 DIAGNOSIS — E669 Obesity, unspecified: Secondary | ICD-10-CM | POA: Diagnosis not present

## 2017-07-21 DIAGNOSIS — E7849 Other hyperlipidemia: Secondary | ICD-10-CM

## 2017-07-21 DIAGNOSIS — IMO0001 Reserved for inherently not codable concepts without codable children: Secondary | ICD-10-CM

## 2017-07-21 DIAGNOSIS — E784 Other hyperlipidemia: Secondary | ICD-10-CM

## 2017-07-21 DIAGNOSIS — Z6838 Body mass index (BMI) 38.0-38.9, adult: Secondary | ICD-10-CM | POA: Diagnosis not present

## 2017-07-21 DIAGNOSIS — E559 Vitamin D deficiency, unspecified: Secondary | ICD-10-CM | POA: Diagnosis not present

## 2017-07-21 MED ORDER — VITAMIN D (ERGOCALCIFEROL) 1.25 MG (50000 UNIT) PO CAPS: 50000.0000 [IU] | ORAL_CAPSULE | ORAL | 0 refills | Status: DC

## 2017-07-24 NOTE — Progress Notes (Signed)
Office: [phone]  /  Fax: [phone]   HPI:   Chief Complaint: OBESITY Ameah is here to discuss her progress with her obesity treatment plan. She is on the  keep a food journal with 1300 to 1500 calories and 80+ grams of protein  and is following her eating plan approximately 100 % of the time. She states she is exercising on recumbent bike for 30 minutes 2 times per week. Belma has done very well with journaling. She ate more food volume than normal but she was able to lose weight well. She worked on Clinical cytogeneticist protein but she still ate a few too many simple carbohydrates. Her weight is 241 lb (109.3 kg) today and has had a weight loss of 7 pounds over a period of 1 week since her last visit. She has lost 7 lbs since starting treatment with [place].  Vitamin D deficiency Ellyana has a diagnosis of vitamin D deficiency. She is currently stable on OTC multi-vitamin but her vitamin D is not yet at goal. She admits fatigue and denies nausea, vomiting or muscle weakness.  Hyperlipidemia Briel has hyperlipidemia her LDL is slightly elevated and she is not on a statin. She is trying to control her cholesterol levels with intensive lifestyle modification including a low saturated fat diet, exercise and weight loss. She denies any chest pain, claudication or myalgias.  ALLERGIES: Allergies  Allergen Reactions  . Aspirin Swelling    Lips only.  . Chocolate   . Gluten Meal     MEDICATIONS: Current Outpatient Prescriptions on File Prior to Visit  Medication Sig Dispense Refill  . Biotin 1000 MCG CHEW Chew by mouth daily.    . calcium-vitamin D (OSCAL WITH D) 500-200 MG-UNIT per tablet Take 2 tablets by mouth 2 (two) times daily.     . cetirizine (ZYRTEC) 10 MG tablet Take 10 mg by mouth daily.     . DHA-EPA-Vitamin E (OMEGA-3 COMPLEX PO) Take by mouth daily.    . diphenhydrAMINE (BENADRYL) 25 MG tablet Take 25 mg by mouth at bedtime as needed.    . fluticasone (FLONASE) 50 MCG/ACT nasal spray  Place 2 sprays into the nose daily.     . LOW-OGESTREL 0.3-30 MG-MCG tablet Take 1 tablet by mouth daily.     . Magnesium 250 MG TABS Take 1 tablet by mouth daily.    . Melatonin 5 MG CAPS Take by mouth daily.    . Multiple Vitamin (MULTIVITAMIN WITH MINERALS) TABS Take 1 tablet by mouth daily.    Marland Kitchen omeprazole (PRILOSEC) 40 MG capsule Take 40 mg by mouth daily.    . vitamin B-6 (PYRIDOXINE) 25 MG tablet Take 25 mg by mouth daily.     No current facility-administered medications on file prior to visit.     PAST MEDICAL HISTORY: Past Medical History:  Diagnosis Date  . Allergy   . Anemia   . B12 deficiency   . Chronic kidney disease    hx of uti   . Gallbladder problem   . GERD (gastroesophageal reflux disease)   . Gluten intolerance   . Gluten intolerance   . Hyperlipidemia    borderline  . Hypertension    Resolved after LAGB  . Joint pain   . Keratoconus   . Morbid obesity (HCC)   . Obesity   . Peeling of nails   . Swallowing difficulty   . Swelling    feet and legs  . TMJ (dislocation of temporomandibular joint)   .  Vitamin D deficiency   . Wears glasses   . White coat syndrome with diagnosis of hypertension     PAST SURGICAL HISTORY: Past Surgical History:  Procedure Laterality Date  . CHOLECYSTECTOMY  1990's   Patient unsure of date  . LAPAROSCOPIC GASTRIC BANDING  01/23/2007  . PANNICULECTOMY  09/24/2012   Procedure: PANNICULECTOMY;  Surgeon: Valarie MerinoMatthew B Martin, MD;  Location: [facility];  Service: General;  Laterality: N/A;  . TONSILECTOMY, ADENOIDECTOMY, BILATERAL MYRINGOTOMY AND TUBES  1972    SOCIAL HISTORY: Social History  Substance Use Topics  . Smoking status: Never Smoker  . Smokeless tobacco: Never Used  . Alcohol use No    FAMILY HISTORY: Family History  Problem Relation Age of Onset  . Hypertension Mother   . Arthritis Mother   . Diabetes Mother   . Hyperlipidemia Mother   . Heart disease Mother   . Depression Mother   . Anxiety disorder  Mother   . Sleep apnea Mother   . Eating disorder Mother   . Obstructive Sleep Apnea Mother   . Obesity Mother   . Cancer Father        lliver, pancreas, stomach  . Liver disease Father   . Chronic fatigue Sister     ROS: Review of Systems  Constitutional: Positive for malaise/fatigue and weight loss.  Cardiovascular: Negative for chest pain and claudication.  Gastrointestinal: Negative for nausea and vomiting.  Musculoskeletal: Negative for myalgias.       Negative muscle weakness    PHYSICAL EXAM: Blood pressure (!) 143/85, pulse 67, temperature 98.3 F (36.8 C), temperature source Oral, height 5\' 6"  (1.676 m), weight 241 lb (109.3 kg), SpO2 98 %. Body mass index is 38.9 kg/m. Physical Exam  Constitutional: She is oriented to person, place, and time. She appears well-developed and well-nourished.  Cardiovascular: Normal rate.   Pulmonary/Chest: Effort normal.  Musculoskeletal: Normal range of motion.  Neurological: She is oriented to person, place, and time.  Skin: Skin is warm and dry.  Psychiatric: She has a normal mood and affect. Her behavior is normal.  Vitals reviewed.   RECENT LABS AND TESTS: BMET    Component Value Date/Time   NA 141 07/14/2017 0935   K 4.7 07/14/2017 0935   CL 104 07/14/2017 0935   CO2 23 07/14/2017 0935   GLUCOSE 76 07/14/2017 0935   GLUCOSE 85 09/26/2012 0414   BUN 9 07/14/2017 0935   CREATININE 0.89 07/14/2017 0935   CALCIUM 9.2 07/14/2017 0935   GFRNONAA 74 07/14/2017 0935   GFRAA 85 07/14/2017 0935   Lab Results  Component Value Date   HGBA1C 5.1 07/14/2017   Lab Results  Component Value Date   INSULIN 6.0 07/14/2017   CBC    Component Value Date/Time   WBC 11.7 (H) 07/14/2017 0935   WBC 9.7 09/26/2012 0414   RBC 4.64 07/14/2017 0935   RBC 4.19 09/26/2012 0414   HGB 13.1 07/14/2017 0935   HCT 41.1 07/14/2017 0935   PLT 228 09/26/2012 0414   MCV 89 07/14/2017 0935   MCH 28.2 07/14/2017 0935   MCH 29.6 09/26/2012  0414   MCHC 31.9 07/14/2017 0935   MCHC 32.7 09/26/2012 0414   RDW 16.2 (H) 07/14/2017 0935   LYMPHSABS 1.5 07/14/2017 0935   EOSABS 0.1 07/14/2017 0935   BASOSABS 0.0 07/14/2017 0935   Iron/TIBC/Ferritin/ %Sat No results found for: IRON, TIBC, FERRITIN, IRONPCTSAT Lipid Panel     Component Value Date/Time   CHOL 196 07/14/2017  0935   TRIG 101 07/14/2017 0935   HDL 42 07/14/2017 0935   LDLCALC 134 (H) 07/14/2017 0935   Hepatic Function Panel     Component Value Date/Time   PROT 6.5 07/14/2017 0935   ALBUMIN 4.2 07/14/2017 0935   AST 25 07/14/2017 0935   ALT 30 07/14/2017 0935   ALKPHOS 62 07/14/2017 0935   BILITOT 0.5 07/14/2017 0935      Component Value Date/Time   TSH 2.560 07/14/2017 0935    ASSESSMENT AND PLAN: Other hyperlipidemia  Vitamin D deficiency - Plan: Vitamin D, Ergocalciferol, (DRISDOL) 50000 units CAPS capsule  Class 2 obesity with serious comorbidity and body mass index (BMI) of 38.0 to 38.9 in adult, unspecified obesity type  PLAN:  Vitamin D Deficiency Abbigael was informed that low vitamin D levels contributes to fatigue and are associated with obesity, breast, and colon cancer. She agrees to start to take prescription Vit D @50 ,000 IU every week #4 with no refills and will follow up for routine testing of vitamin D, at least 2-3 times per year. She was informed of the risk of over-replacement of vitamin D and agrees to not increase her dose unless he discusses this with [place] first. Yamina agrees to follow up with our clinic as needed as she continues her weight loss journey.  Hyperlipidemia Ellysa was informed of the American Heart Association Guidelines emphasizing intensive lifestyle modifications as the first line treatment for hyperlipidemia. We discussed many lifestyle modifications today in depth, and Shahla will continue to work on decreasing saturated fats such as fatty red meat, butter and many fried foods. She will also increase vegetables and lean protein  in her diet and continue to work on exercise and weight loss efforts.  Obesity Genifer is currently in the action stage of change. As such, her goal is to continue with weight loss efforts She has agreed to keep a food journal with 1300 to 1500 calories and 80+ grams of protein  Tilda has been instructed to work up to a goal of 150 minutes of combined cardio and strengthening exercise per week for weight loss and overall health benefits. We discussed the following Behavioral Modification Strategies today: increasing lean protein intake, planning for success and keep a strict food journal  Rayne has agreed to follow up with our clinic as needed as she continues her journey with weight loss. She was informed of the importance of frequent follow up visits to maximize her success with intensive lifestyle modifications for her multiple health conditions.  I, [place] Crane, am acting as transcriptionist for Quillian Quince, MD  I have reviewed the above documentation for accuracy and completeness, and I agree with the above. -Quillian Quince, MD   OBESITY BEHAVIORAL INTERVENTION VISIT  Today's visit was # 2 out of 22.  Starting weight: 248 lbs Starting date: 07/14/17 Today's weight : 241 lbs Today's date: 07/21/2017 Total lbs lost to date: 7 (Patients must lose 7 lbs in the first 6 months to continue with counseling)   ASK: We discussed the diagnosis of obesity with Payslie Jarome Matin today and Maureen agreed to give [place] permission to discuss obesity behavioral modification therapy today.  ASSESS: Anuoluwapo has the diagnosis of obesity and her BMI today is 86 Charleigh is in the action stage of change   ADVISE: Tess was educated on the multiple health risks of obesity as well as the benefit of weight loss to improve her health. She was advised of the need for long term treatment  and the importance of lifestyle modifications.  AGREE: Multiple dietary modification options and treatment options were discussed and  Zoraida  agreed to keep a food journal with 1300 to 1500 calories and 80+ grams of protein  We discussed the following Behavioral Modification Strategies today: increasing lean protein intake, planning for success and keep a strict food journal

## 2017-07-27 ENCOUNTER — Ambulatory Visit (INDEPENDENT_AMBULATORY_CARE_PROVIDER_SITE_OTHER): Payer: 59 | Admitting: Family Medicine

## 2017-07-27 VITALS — Ht 66.0 in | Wt 244.0 lb

## 2017-07-27 DIAGNOSIS — E669 Obesity, unspecified: Secondary | ICD-10-CM | POA: Diagnosis not present

## 2017-07-27 DIAGNOSIS — Z6839 Body mass index (BMI) 39.0-39.9, adult: Secondary | ICD-10-CM

## 2017-07-27 DIAGNOSIS — Z9189 Other specified personal risk factors, not elsewhere classified: Secondary | ICD-10-CM

## 2017-07-27 DIAGNOSIS — IMO0001 Reserved for inherently not codable concepts without codable children: Secondary | ICD-10-CM

## 2017-08-01 NOTE — Progress Notes (Signed)
  Office: [phone]  /  Fax: [phone]  OBESITY AND PREVENTATIVE COUNSELING BEHAVIORAL INTERVENTION VISIT  Today's visit was # 3 out of 22.  (Back on Track #1)  Starting weight: 248 pounds Starting date: 07/14/17 Today's weight : Weight: 244 lb (110.7 kg)  Today's date: 07/27/17 Total lbs lost to date: 4 (Patients must lose 7 lbs in the first 6 months to continue with counseling)  PREVENTATIVE COUNSELING: Susan Dorsey is at high risk of developing multiple serious health conditions including uncontrolled diabetes, coronary artery disease, heart failure, sleep apnea, chronic pain, depression, obesity related cancers and more due to her weight. These risks have been discussed in depth and Susan Dorsey has agreed to work on the underlying disease of obesity to decrease the risk of developing any and all of these obesity related disease  ASK: We discussed the diagnosis of obesity with Susan Dorsey today and Susan Dorsey agreed to give Susan Dorsey permission to discuss obesity behavioral modification therapy today.  ASSESS: Susan Dorsey has the diagnosis of obesity and her BMI today is 39.5 Susan Dorsey is in the action stage of change   ADVISE: Susan Dorsey was educated on the multiple health risks of obesity as well as the benefit of weight loss to improve her health. She was advised of the need for long term treatment and the importance of lifestyle modifications.  AGREE: Multiple dietary modification options and treatment options were discussed and  Susan Dorsey agreed to keep a food journal with 1300 to 1500 calories and 80 g protein of  protein  We discussed the following Behavioral Modification Stratagies today: increasing lean protein intake, decreasing simple carbohydrates , increasing vegetables, work on meal planning and easy cooking plans, decrease snacking  and avoiding temptations.  We spent > than 50% of the 15 minute visit on the counseling as documented in the note.

## 2017-08-03 ENCOUNTER — Ambulatory Visit (INDEPENDENT_AMBULATORY_CARE_PROVIDER_SITE_OTHER): Payer: 59 | Admitting: Family Medicine

## 2017-08-03 VITALS — Ht 66.0 in | Wt 242.0 lb

## 2017-08-03 DIAGNOSIS — Z9189 Other specified personal risk factors, not elsewhere classified: Secondary | ICD-10-CM

## 2017-08-03 DIAGNOSIS — E669 Obesity, unspecified: Secondary | ICD-10-CM

## 2017-08-03 DIAGNOSIS — IMO0001 Reserved for inherently not codable concepts without codable children: Secondary | ICD-10-CM

## 2017-08-03 DIAGNOSIS — Z9884 Bariatric surgery status: Secondary | ICD-10-CM | POA: Diagnosis not present

## 2017-08-03 DIAGNOSIS — Z9889 Other specified postprocedural states: Secondary | ICD-10-CM | POA: Diagnosis not present

## 2017-08-03 DIAGNOSIS — Z6839 Body mass index (BMI) 39.0-39.9, adult: Secondary | ICD-10-CM | POA: Diagnosis not present

## 2017-08-07 NOTE — Progress Notes (Signed)
  Office: [phone]  /  Fax: [phone]  OBESITY AND PREVENTATIVE COUNSELING BEHAVIORAL INTERVENTION VISIT  Today's visit was # 4 out of 22, BOT# 2  Starting weight: 248 lbs Starting date: 07/14/17 Today's weight : Weight: 242 lb (109.8 kg)  Today's date: 08/03/17 Total lbs lost to date: 6 pounds (Patients must lose 7 lbs in the first 6 months to continue with counseling)  PREVENTATIVE COUNSELING: Susan Dorsey is at high risk of developing multiple serious health conditions including uncontrolled diabetes, coronary artery disease, heart failure, sleep apnea, chronic pain, depression, obesity related cancers and more due to her weight. These risks have been discussed in depth and Susan Dorsey has agreed to work on the underlying disease of obesity to decrease the risk of developing any and all of these obesity related disease.  Emotional eating and strategies to address emotional eating were discussed at length today. Susan Dorsey agreed to implement these strategies when appropriate to assist her weight loss efforts.   ASK: We discussed the diagnosis of obesity with Susan Dorsey today and Susan Dorsey agreed to give Susan Dorsey permission to discuss obesity behavioral modification therapy today.  ASSESS: Susan Dorsey has the diagnosis of obesity and her BMI today is 39.1 Susan Dorsey is in the action stage of change   ADVISE: Susan Dorsey was educated on the multiple health risks of obesity as well as the benefit of weight loss to improve her health. She was advised of the need for long term treatment and the importance of lifestyle modifications.  AGREE: Multiple dietary modification options and treatment options were discussed and  Susan Dorsey agreed to keep a food journal with 1300 to 1500 calories and 80+ protein  We discussed the following Behavioral Modification Stratagies today: increasing lean protein intake, emotional eating strategies and avoiding temptations.  We spent > than 50% of the 15 minute visit on the counseling as documented in the  note.

## 2017-08-10 ENCOUNTER — Ambulatory Visit (INDEPENDENT_AMBULATORY_CARE_PROVIDER_SITE_OTHER): Payer: 59 | Admitting: Family Medicine

## 2017-08-10 VITALS — Ht 66.0 in | Wt 243.0 lb

## 2017-08-10 DIAGNOSIS — Z9884 Bariatric surgery status: Secondary | ICD-10-CM

## 2017-08-10 DIAGNOSIS — Z9889 Other specified postprocedural states: Secondary | ICD-10-CM

## 2017-08-10 DIAGNOSIS — Z6839 Body mass index (BMI) 39.0-39.9, adult: Secondary | ICD-10-CM | POA: Diagnosis not present

## 2017-08-10 DIAGNOSIS — E669 Obesity, unspecified: Secondary | ICD-10-CM | POA: Diagnosis not present

## 2017-08-10 DIAGNOSIS — Z9189 Other specified personal risk factors, not elsewhere classified: Secondary | ICD-10-CM

## 2017-08-10 DIAGNOSIS — IMO0001 Reserved for inherently not codable concepts without codable children: Secondary | ICD-10-CM

## 2017-08-15 NOTE — Progress Notes (Signed)
  Office: [phone]  /  Fax: [phone]  OBESITY AND PREVENTATIVE COUNSELING BEHAVIORAL INTERVENTION VISIT  Today's visit was # 5 out of 22, BOT# 3  Starting weight: 248 lbs Starting date: 07/14/17 Today's weight : Weight: 243 lb (110.2 kg)  Today's date: 08/10/17 Total lbs lost to date: 5 lbs (Patients must lose 7 lbs in the first 6 months to continue with counseling)  PREVENTATIVE COUNSELING: Nakea is at high risk of developing multiple serious health conditions including uncontrolled diabetes, coronary artery disease, heart failure, sleep apnea, chronic pain, depression, obesity related cancers and more due to her weight. These risks have been discussed in depth and Zaniah has agreed to work on the underlying disease of obesity to decrease the risk of developing any and all of these obesity related disease.  Strategies for eating healthy in unsupportive environments was discussed at length today. Focus on behavioral interventions for creating a supportive environment and minimize temptations. Ilamae agreed to implement these strategies into her daily life as needed.   ASK: We discussed the diagnosis of obesity with Myalynn Jarome Matin today and Khalie agreed to give [place] permission to discuss obesity behavioral modification therapy today.  ASSESS: Taniaya has the diagnosis of obesity and her BMI today is 39.3 Venda is in the action stage of change   ADVISE: Elonda was educated on the multiple health risks of obesity as well as the benefit of weight loss to improve her health. She was advised of the need for long term treatment and the importance of lifestyle modifications.  AGREE: Multiple dietary modification options and treatment options were discussed and  Eyla agreed to keep a food journal with 1300 to 1500 calories and 80 + protein  We discussed the following Behavioral Modification Stratagies today: dealing with family or coworker sabotage and avoiding temptations.  We spent > than 50% of the 15  minute visit on the counseling as documented in the note.

## 2017-08-17 ENCOUNTER — Ambulatory Visit (INDEPENDENT_AMBULATORY_CARE_PROVIDER_SITE_OTHER): Payer: 59 | Admitting: Family Medicine

## 2017-08-17 VITALS — Ht 66.0 in | Wt 241.0 lb

## 2017-08-17 DIAGNOSIS — Z9189 Other specified personal risk factors, not elsewhere classified: Secondary | ICD-10-CM

## 2017-08-17 DIAGNOSIS — IMO0001 Reserved for inherently not codable concepts without codable children: Secondary | ICD-10-CM

## 2017-08-17 DIAGNOSIS — Z9884 Bariatric surgery status: Secondary | ICD-10-CM | POA: Diagnosis not present

## 2017-08-17 DIAGNOSIS — Z6838 Body mass index (BMI) 38.0-38.9, adult: Secondary | ICD-10-CM | POA: Diagnosis not present

## 2017-08-17 DIAGNOSIS — E669 Obesity, unspecified: Secondary | ICD-10-CM

## 2017-08-18 DIAGNOSIS — G5621 Lesion of ulnar nerve, right upper limb: Secondary | ICD-10-CM | POA: Diagnosis not present

## 2017-08-21 NOTE — Progress Notes (Signed)
  Office: [phone]  /  Fax: [phone]  OBESITY AND PREVENTATIVE COUNSELING BEHAVIORAL INTERVENTION VISIT  Today's visit was # 6 out of 22, BOT# 4  Starting weight: 248 lbs Starting date: 07/14/17 Today's weight : Weight: 241 lb (109.3 kg)  Today's date:08/17/17 Total lbs lost to date: 7 lbs (Patients must lose 7 lbs in the first 6 months to continue with counseling)  PREVENTATIVE COUNSELING: Susan Dorsey is at high risk of developing multiple serious health conditions including uncontrolled diabetes, coronary artery disease, heart failure, sleep apnea, chronic pain, depression, obesity related cancers and more due to her weight. These risks have been discussed in depth and Susan Dorsey has agreed to work on the underlying disease of obesity to decrease the risk of developing any and all of these obesity related disease.  Strategies for healthy eating during holidays, celebrations and vacations discussed at length today. Behavior modification strategies for overcoming feelings of guilt that may accompany overeating were also discussed at length today.   ASK: We discussed the diagnosis of obesity with Susan Dorsey today and Susan Dorsey agreed to give [place] permission to discuss obesity behavioral modification therapy today.  ASSESS: Susan Dorsey has the diagnosis of obesity and her BMI today is 38.9 Susan Dorsey is in the action stage of change   ADVISE: Susan Dorsey was educated on the multiple health risks of obesity as well as the benefit of weight loss to improve her health. She was advised of the need for long term treatment and the importance of lifestyle modifications.  AGREE: Multiple dietary modification options and treatment options were discussed and  Susan Dorsey agreed to keep a food journal with 1300 to 1500 calories and 80+ protein  We discussed the following Behavioral Modification Stratagies today: increasing lean protein intake, increasing vegetables, work on meal planning and easy cooking plans, holiday eating strategies ,  emotional eating strategies and avoiding temptations  We spent > than 50% of the 15 minute visit on the counseling as documented in the note.

## 2017-09-11 ENCOUNTER — Ambulatory Visit (INDEPENDENT_AMBULATORY_CARE_PROVIDER_SITE_OTHER): Payer: 59 | Admitting: Family Medicine

## 2017-09-11 VITALS — BP 131/85 | HR 65 | Temp 98.4°F | Ht 66.0 in | Wt 240.0 lb

## 2017-09-11 DIAGNOSIS — E784 Other hyperlipidemia: Secondary | ICD-10-CM | POA: Diagnosis not present

## 2017-09-11 DIAGNOSIS — IMO0001 Reserved for inherently not codable concepts without codable children: Secondary | ICD-10-CM

## 2017-09-11 DIAGNOSIS — E559 Vitamin D deficiency, unspecified: Secondary | ICD-10-CM | POA: Diagnosis not present

## 2017-09-11 DIAGNOSIS — Z6838 Body mass index (BMI) 38.0-38.9, adult: Secondary | ICD-10-CM

## 2017-09-11 DIAGNOSIS — E669 Obesity, unspecified: Secondary | ICD-10-CM | POA: Diagnosis not present

## 2017-09-11 DIAGNOSIS — E7849 Other hyperlipidemia: Secondary | ICD-10-CM

## 2017-09-11 MED ORDER — VITAMIN D (ERGOCALCIFEROL) 1.25 MG (50000 UNIT) PO CAPS: 50000.0000 [IU] | ORAL_CAPSULE | ORAL | 0 refills | Status: DC

## 2017-09-11 NOTE — Progress Notes (Signed)
Office: [phone]  /  Fax: [phone]   HPI:   Chief Complaint: OBESITY Susan Dorsey is here to discuss her progress with her obesity treatment plan. She is on the keep a food journal with 1300 to 1500 calories and 80+ grams of protein daily and is following her eating plan approximately 20 % of the time. She states she is exercising 0 minutes 0 times per week. Susan Dorsey continues to do well with weight loss. She had more challenges planing ahead as she was traveling but she made smarter food choices and controlled her portions. Her weight is 240 lb (108.9 kg) today and has had a weight loss of 1 pound over a period of 3 to 4 weeks since her last visit. She has lost 8 lbs since starting treatment with [place].  Vitamin D deficiency Susan Dorsey has a diagnosis of vitamin D deficiency. She is currently taking vit D and denies nausea, vomiting or muscle weakness.  Hyperlipidemia Susan Dorsey has hyperlipidemia and is not currently on a statin. Her LDL is 134 and she declines medications. She has been trying to improve her cholesterol levels with intensive lifestyle modification including a low saturated fat diet, exercise and weight loss. She denies any chest pain, claudication or myalgias.   ALLERGIES: Allergies  Allergen Reactions  . Aspirin Swelling    Lips only.  . Chocolate   . Gluten Meal     MEDICATIONS: Current Outpatient Prescriptions on File Prior to Visit  Medication Sig Dispense Refill  . Biotin 1000 MCG CHEW Chew by mouth daily.    . calcium-vitamin D (OSCAL WITH D) 500-200 MG-UNIT per tablet Take 2 tablets by mouth 2 (two) times daily.     . cetirizine (ZYRTEC) 10 MG tablet Take 10 mg by mouth daily.     . DHA-EPA-Vitamin E (OMEGA-3 COMPLEX PO) Take by mouth daily.    . diphenhydrAMINE (BENADRYL) 25 MG tablet Take 25 mg by mouth at bedtime as needed.    . fluticasone (FLONASE) 50 MCG/ACT nasal spray Place 2 sprays into the nose daily.     . LOW-OGESTREL 0.3-30 MG-MCG tablet Take 1 tablet by mouth  daily.     . Magnesium 250 MG TABS Take 1 tablet by mouth daily.    . Melatonin 5 MG CAPS Take by mouth daily.    . Multiple Vitamin (MULTIVITAMIN WITH MINERALS) TABS Take 1 tablet by mouth daily.    Marland Kitchen omeprazole (PRILOSEC) 40 MG capsule Take 40 mg by mouth 2 (two) times daily.      No current facility-administered medications on file prior to visit.     PAST MEDICAL HISTORY: Past Medical History:  Diagnosis Date  . Allergy   . Anemia   . B12 deficiency   . Chronic kidney disease    hx of uti   . Gallbladder problem   . GERD (gastroesophageal reflux disease)   . Gluten intolerance   . Gluten intolerance   . Hyperlipidemia    borderline  . Hypertension    Resolved after LAGB  . Joint pain   . Keratoconus   . Morbid obesity (HCC)   . Obesity   . Peeling of nails   . Swallowing difficulty   . Swelling    feet and legs  . TMJ (dislocation of temporomandibular joint)   . Vitamin D deficiency   . Wears glasses   . White coat syndrome with diagnosis of hypertension     PAST SURGICAL HISTORY: Past Surgical History:  Procedure Laterality Date  .  CHOLECYSTECTOMY  1990's   Patient unsure of date  . LAPAROSCOPIC GASTRIC BANDING  01/23/2007  . PANNICULECTOMY  09/24/2012   Procedure: PANNICULECTOMY;  Surgeon: Valarie Merino, MD;  Location: [facility];  Service: General;  Laterality: N/A;  . TONSILECTOMY, ADENOIDECTOMY, BILATERAL MYRINGOTOMY AND TUBES  1972    SOCIAL HISTORY: Social History  Substance Use Topics  . Smoking status: Never Smoker  . Smokeless tobacco: Never Used  . Alcohol use No    FAMILY HISTORY: Family History  Problem Relation Age of Onset  . Hypertension Mother   . Arthritis Mother   . Diabetes Mother   . Hyperlipidemia Mother   . Heart disease Mother   . Depression Mother   . Anxiety disorder Mother   . Sleep apnea Mother   . Eating disorder Mother   . Obstructive Sleep Apnea Mother   . Obesity Mother   . Cancer Father        lliver, pancreas,  stomach  . Liver disease Father   . Chronic fatigue Sister     ROS: Review of Systems  Constitutional: Positive for weight loss.  Cardiovascular: Negative for chest pain and claudication.  Gastrointestinal: Negative for nausea and vomiting.  Musculoskeletal: Negative for myalgias.       Negative muscle weakness    PHYSICAL EXAM: Blood pressure 131/85, pulse 65, temperature 98.4 F (36.9 C), temperature source Oral, height  (1.676 m), weight 240 lb (108.9 kg), last menstrual period 08/11/2017, SpO2 99 %. Body mass index is 38.74 kg/m. Physical Exam  Constitutional: She is oriented to person, place, and time. She appears well-developed and well-nourished.  Cardiovascular: Normal rate.   Pulmonary/Chest: Effort normal.  Musculoskeletal: Normal range of motion.  Neurological: She is oriented to person, place, and time.  Skin: Skin is warm and dry.  Psychiatric: She has a normal mood and affect. Her behavior is normal.  Vitals reviewed.   RECENT LABS AND TESTS: BMET    Component Value Date/Time   NA 141 07/14/2017 0935   K 4.7 07/14/2017 0935   CL 104 07/14/2017 0935   CO2 23 07/14/2017 0935   GLUCOSE 76 07/14/2017 0935   GLUCOSE 85 09/26/2012 0414   BUN 9 07/14/2017 0935   CREATININE 0.89 07/14/2017 0935   CALCIUM 9.2 07/14/2017 0935   GFRNONAA 74 07/14/2017 0935   GFRAA 85 07/14/2017 0935   Lab Results  Component Value Date   HGBA1C 5.1 07/14/2017   Lab Results  Component Value Date   INSULIN 6.0 07/14/2017   CBC    Component Value Date/Time   WBC 11.7 (H) 07/14/2017 0935   WBC 9.7 09/26/2012 0414   RBC 4.64 07/14/2017 0935   RBC 4.19 09/26/2012 0414   HGB 13.1 07/14/2017 0935   HCT 41.1 07/14/2017 0935   PLT 228 09/26/2012 0414   MCV 89 07/14/2017 0935   MCH 28.2 07/14/2017 0935   MCH 29.6 09/26/2012 0414   MCHC 31.9 07/14/2017 0935   MCHC 32.7 09/26/2012 0414   RDW 16.2 (H) 07/14/2017 0935   LYMPHSABS 1.5 07/14/2017 0935   EOSABS 0.1  07/14/2017 0935   BASOSABS 0.0 07/14/2017 0935   Iron/TIBC/Ferritin/ %Sat No results found for: IRON, TIBC, FERRITIN, IRONPCTSAT Lipid Panel     Component Value Date/Time   CHOL 196 07/14/2017 0935   TRIG 101 07/14/2017 0935   HDL 42 07/14/2017 0935   LDLCALC 134 (H) 07/14/2017 0935   Hepatic Function Panel     Component Value Date/Time  PROT 6.5 07/14/2017 0935   ALBUMIN 4.2 07/14/2017 0935   AST 25 07/14/2017 0935   ALT 30 07/14/2017 0935   ALKPHOS 62 07/14/2017 0935   BILITOT 0.5 07/14/2017 0935      Component Value Date/Time   TSH 2.560 07/14/2017 0935    ASSESSMENT AND PLAN: Vitamin D deficiency - Plan: Vitamin D, Ergocalciferol, (DRISDOL) 50000 units CAPS capsule  Other hyperlipidemia  Class 2 obesity with serious comorbidity and body mass index (BMI) of 38.0 to 38.9 in adult, unspecified obesity type  PLAN:  Vitamin D Deficiency Susan Dorsey was informed that low vitamin D levels contributes to fatigue and are associated with obesity, breast, and colon cancer. She agrees to continue to take prescription Vit D ,000 IU every week, we will refill for 1 month and will follow up for routine testing of vitamin D, at least 2-3 times per year. She was informed of the risk of over-replacement of vitamin D and agrees to not increase her dose unless he discusses this with [place] first. Susan Dorsey agrees to follow up with our clinic in 5 weeks.  Hyperlipidemia Susan Dorsey was informed of the American Heart Association Guidelines emphasizing intensive lifestyle modifications as the first line treatment for hyperlipidemia. We discussed many lifestyle modifications today in depth, and Susan Dorsey will continue to work on decreasing saturated fats such as fatty red meat, butter and many fried foods. She will also increase vegetables and lean protein in her diet and continue to work on exercise and weight loss efforts.   Obesity Susan Dorsey is currently in the action stage of change. As such, her goal is to continue with  weight loss efforts She has agreed to keep a food journal with 1300 to 1500 calories and 80 grams of protein daily Susan Dorsey has been instructed to work up to a goal of 150 minutes of combined cardio and strengthening exercise per week for weight loss and overall health benefits. We discussed the following Behavioral Modification Strategies today: increasing lean protein intake and work on meal planning and easy cooking plans  Susan Dorsey has agreed to follow up with our clinic in 5 weeks. She was informed of the importance of frequent follow up visits to maximize her success with intensive lifestyle modifications for her multiple health conditions.  Susan Dorsey, Susan Dorsey, am acting as transcriptionist for Susan Level, PA-C  Susan Dorsey have reviewed the above documentation for accuracy and completeness, and Susan Dorsey agree with the above. -Susan Level, PA-C  Susan Dorsey have reviewed the above note and agree with the plan. -Susan Quince, MD   OBESITY BEHAVIORAL INTERVENTION VISIT  Today's visit was # 4 out of 22.  Starting weight: 248 lbs Starting date: 07/14/17 Today's weight : 240 lbs  Today's date: 09/11/2017 Total lbs lost to date: 8 (Patients must lose 7 lbs in the first 6 months to continue with counseling)   ASK: We discussed the diagnosis of obesity with Susan Dorsey Susan Dorsey today and Susan Dorsey agreed to give [place] permission to discuss obesity behavioral modification therapy today.  ASSESS: Susan Dorsey has the diagnosis of obesity and her BMI today is 38.76 Susan Dorsey is in the action stage of change   ADVISE: Susan Dorsey was educated on the multiple health risks of obesity as well as the benefit of weight loss to improve her health. She was advised of the need for long term treatment and the importance of lifestyle modifications.  AGREE: Multiple dietary modification options and treatment options were discussed and  Susan Dorsey agreed to keep a food journal with 1300  to 1500 calories and 80 grams of protein daily We discussed the following Behavioral  Modification Strategies today: increasing lean protein intake and work on meal planning and easy cooking plans

## 2017-09-14 DIAGNOSIS — R112 Nausea with vomiting, unspecified: Secondary | ICD-10-CM | POA: Diagnosis not present

## 2017-09-14 DIAGNOSIS — R05 Cough: Secondary | ICD-10-CM | POA: Diagnosis not present

## 2017-09-14 DIAGNOSIS — K219 Gastro-esophageal reflux disease without esophagitis: Secondary | ICD-10-CM | POA: Diagnosis not present

## 2017-10-16 ENCOUNTER — Ambulatory Visit (INDEPENDENT_AMBULATORY_CARE_PROVIDER_SITE_OTHER): Payer: 59 | Admitting: Physician Assistant

## 2017-10-16 VITALS — BP 133/84 | HR 57 | Temp 98.6°F | Ht 66.0 in | Wt 233.0 lb

## 2017-10-16 DIAGNOSIS — Z6837 Body mass index (BMI) 37.0-37.9, adult: Secondary | ICD-10-CM

## 2017-10-16 DIAGNOSIS — Z9189 Other specified personal risk factors, not elsewhere classified: Secondary | ICD-10-CM | POA: Diagnosis not present

## 2017-10-16 DIAGNOSIS — E7849 Other hyperlipidemia: Secondary | ICD-10-CM

## 2017-10-16 DIAGNOSIS — E559 Vitamin D deficiency, unspecified: Secondary | ICD-10-CM | POA: Diagnosis not present

## 2017-10-16 MED ORDER — VITAMIN D (ERGOCALCIFEROL) 1.25 MG (50000 UNIT) PO CAPS: 50000.0000 [IU] | ORAL_CAPSULE | ORAL | 0 refills | Status: DC

## 2017-10-16 NOTE — Progress Notes (Signed)
Office: [phone]  /  Fax: [phone]   HPI:   Chief Complaint: OBESITY Susan Dorsey is here to discuss her progress with her obesity treatment plan. She is on the keep a food journal with 1300-1500 calories and 80 grams of protein  and is following her eating plan approximately 80 % of the time. She states she is exercising walking 15 minutes 5 times per week. Susan Dorsey continues to do well with weight loss. She gets her protein in and she states her hunger is well controlled. She would like more meal planning ideas. Her weight is 233 lb (105.7 kg) today and has had a weight loss of 7 pounds over a period of 5 weeks since her last visit. She has lost 15 lbs since starting treatment with us.  Hyperlipidemia Susan Dorsey has hyperlipidemia and has been trying to improve her cholesterol levels with intensive lifestyle modification including a low saturated fat diet, exercise and weight loss. She is not on statin and declines medication. She denies any chest pain, claudication or myalgias.  At risk for cardiovascular disease Susan Dorsey is at a higher than average risk for cardiovascular disease due to obesity and hyperlipidemia. She currently denies any chest pain.  Vitamin D deficiency Susan Dorsey has a diagnosis of vitamin D deficiency. She is currently taking prescription Vit D and denies nausea, vomiting or muscle weakness.  ALLERGIES: Allergies  Allergen Reactions  . Aspirin Swelling    Lips only.  . Chocolate   . Gluten Meal     MEDICATIONS: Current Outpatient Prescriptions on File Prior to Visit  Medication Sig Dispense Refill  . Biotin 1000 MCG CHEW Chew by mouth daily.    . calcium-vitamin D (OSCAL WITH D) 500-200 MG-UNIT per tablet Take 2 tablets by mouth 2 (two) times daily.     . cetirizine (ZYRTEC) 10 MG tablet Take 10 mg by mouth daily.     . DHA-EPA-Vitamin E (OMEGA-3 COMPLEX PO) Take by mouth daily.    . diphenhydrAMINE (BENADRYL) 25 MG tablet Take 25 mg by mouth at bedtime as needed.    .  fluticasone (FLONASE) 50 MCG/ACT nasal spray Place 2 sprays into the nose daily.     . LOW-OGESTREL 0.3-30 MG-MCG tablet Take 1 tablet by mouth daily.     . Magnesium 250 MG TABS Take 1 tablet by mouth daily.    . Melatonin 5 MG CAPS Take by mouth daily.    . Multiple Vitamin (MULTIVITAMIN WITH MINERALS) TABS Take 1 tablet by mouth daily.    Marland Kitchen. omeprazole (PRILOSEC) 40 MG capsule Take 40 mg by mouth 2 (two) times daily.     Marland Kitchen. VITAMIN B COMPLEX-C CAPS Take 1 capsule by mouth daily.     No current facility-administered medications on file prior to visit.     PAST MEDICAL HISTORY: Past Medical History:  Diagnosis Date  . Allergy   . Anemia   . B12 deficiency   . Chronic kidney disease    hx of uti   . Gallbladder problem   . GERD (gastroesophageal reflux disease)   . Gluten intolerance   . Gluten intolerance   . Hyperlipidemia    borderline  . Hypertension    Resolved after LAGB  . Joint pain   . Keratoconus   . Morbid obesity (HCC)   . Obesity   . Peeling of nails   . Swallowing difficulty   . Swelling    feet and legs  . TMJ (dislocation of temporomandibular joint)   .  Vitamin D deficiency   . Wears glasses   . White coat syndrome with diagnosis of hypertension     PAST SURGICAL HISTORY: Past Surgical History:  Procedure Laterality Date  . CHOLECYSTECTOMY  1990's   Patient unsure of date  . LAPAROSCOPIC GASTRIC BANDING  01/23/2007  . PANNICULECTOMY  09/24/2012   Procedure: PANNICULECTOMY;  Surgeon: Valarie Merino, MD;  Location: [facility];  Service: General;  Laterality: N/A;  . TONSILECTOMY, ADENOIDECTOMY, BILATERAL MYRINGOTOMY AND TUBES  1972    SOCIAL HISTORY: Social History  Substance Use Topics  . Smoking status: Never Smoker  . Smokeless tobacco: Never Used  . Alcohol use No    FAMILY HISTORY: Family History  Problem Relation Age of Onset  . Hypertension Mother   . Arthritis Mother   . Diabetes Mother   . Hyperlipidemia Mother   . Heart disease  Mother   . Depression Mother   . Anxiety disorder Mother   . Sleep apnea Mother   . Eating disorder Mother   . Obstructive Sleep Apnea Mother   . Obesity Mother   . Cancer Father        lliver, pancreas, stomach  . Liver disease Father   . Chronic fatigue Sister     ROS: Review of Systems  Constitutional: Positive for weight loss.  Cardiovascular: Negative for chest pain and claudication.  Gastrointestinal: Negative for nausea and vomiting.  Musculoskeletal: Negative for myalgias.       Negative muscle weakness    PHYSICAL EXAM: Blood pressure 133/84, pulse (!) 57, temperature 98.6 F (37 C), temperature source Oral, height 5\' 6"  (1.676 m), weight 233 lb (105.7 kg), last menstrual period 10/11/2017, SpO2 98 %. Body mass index is 37.61 kg/m. Physical Exam  Constitutional: She is oriented to person, place, and time. She appears well-developed and well-nourished.  Cardiovascular:  bradycardic  Pulmonary/Chest: Effort normal.  Musculoskeletal: Normal range of motion.  Neurological: She is oriented to person, place, and time.  Skin: Skin is warm and dry.  Psychiatric: She has a normal mood and affect. Her behavior is normal.  Vitals reviewed.   RECENT LABS AND TESTS: BMET    Component Value Date/Time   NA 141 07/14/2017 0935   K 4.7 07/14/2017 0935   CL 104 07/14/2017 0935   CO2 23 07/14/2017 0935   GLUCOSE 76 07/14/2017 0935   GLUCOSE 85 09/26/2012 0414   BUN 9 07/14/2017 0935   CREATININE 0.89 07/14/2017 0935   CALCIUM 9.2 07/14/2017 0935   GFRNONAA 74 07/14/2017 0935   GFRAA 85 07/14/2017 0935   Lab Results  Component Value Date   HGBA1C 5.1 07/14/2017   Lab Results  Component Value Date   INSULIN 6.0 07/14/2017   CBC    Component Value Date/Time   WBC 11.7 (H) 07/14/2017 0935   WBC 9.7 09/26/2012 0414   RBC 4.64 07/14/2017 0935   RBC 4.19 09/26/2012 0414   HGB 13.1 07/14/2017 0935   HCT 41.1 07/14/2017 0935   PLT 228 09/26/2012 0414   MCV 89  07/14/2017 0935   MCH 28.2 07/14/2017 0935   MCH 29.6 09/26/2012 0414   MCHC 31.9 07/14/2017 0935   MCHC 32.7 09/26/2012 0414   RDW 16.2 (H) 07/14/2017 0935   LYMPHSABS 1.5 07/14/2017 0935   EOSABS 0.1 07/14/2017 0935   BASOSABS 0.0 07/14/2017 0935   Iron/TIBC/Ferritin/ %Sat No results found for: IRON, TIBC, FERRITIN, IRONPCTSAT Lipid Panel     Component Value Date/Time   CHOL 196  07/14/2017 0935   TRIG 101 07/14/2017 0935   HDL 42 07/14/2017 0935   LDLCALC 134 (H) 07/14/2017 0935   Hepatic Function Panel     Component Value Date/Time   PROT 6.5 07/14/2017 0935   ALBUMIN 4.2 07/14/2017 0935   AST 25 07/14/2017 0935   ALT 30 07/14/2017 0935   ALKPHOS 62 07/14/2017 0935   BILITOT 0.5 07/14/2017 0935      Component Value Date/Time   TSH 2.560 07/14/2017 0935    ASSESSMENT AND PLAN: Vitamin D deficiency - Plan: Comprehensive metabolic panel, VITAMIN D 25 Hydroxy (Vit-D Deficiency, Fractures), Vitamin D, Ergocalciferol, (DRISDOL) 50000 units CAPS capsule  Other hyperlipidemia - Plan: Comprehensive metabolic panel, Lipid Panel With LDL/HDL Ratio  At risk for heart disease  Class 2 severe obesity with serious comorbidity and body mass index (BMI) of 37.0 to 37.9 in adult, unspecified obesity type (HCC)  PLAN:  Hyperlipidemia Susan Dorsey was informed of the American Heart Association Guidelines emphasizing intensive lifestyle modifications as the first line treatment for hyperlipidemia. We discussed many lifestyle modifications today in depth, and Quantasia will continue to work on decreasing saturated fats such as fatty red meat, butter and many fried foods. She will also increase vegetables and lean protein in her diet and continue to work on diet, exercise, and weight loss efforts.  Cardiovascular risk counselling Susan Dorsey was given extended (15 minutes) coronary artery disease prevention counseling today. She is 56 y.o. female and has risk factors for heart disease including obesity and  hyperlipidemia. We discussed intensive lifestyle modifications today with an emphasis on specific weight loss instructions and strategies. Pt was also informed of the importance of increasing exercise and decreasing saturated fats to help prevent heart disease.  Vitamin D Deficiency Susan Dorsey was informed that low vitamin D levels contributes to fatigue and are associated with obesity, breast, and colon cancer. She agrees to continue taking prescription Vit D @50 ,000 IU every week #4 and we will refill for 1 month. She will follow up for routine testing of vitamin D, at least 2-3 times per year. She was informed of the risk of over-replacement of vitamin D and agrees to not increase her dose unless he discusses this with [place] first. Susan Dorsey agrees to follow up with our clinic in 4 weeks.  Obesity Susan Dorsey is currently in the action stage of change. As such, her goal is to continue with weight loss efforts She has agreed to keep a food journal with 1300-1500 calories and 80 grams of protein  Susan Dorsey has been instructed to work up to a goal of 150 minutes of combined cardio and strengthening exercise per week for weight loss and overall health benefits. We discussed the following Behavioral Modification Strategies today: increasing lean protein intake and work on meal planning and easy cooking plans   Susan Dorsey has agreed to follow up with our clinic in 4 weeks. She was informed of the importance of frequent follow up visits to maximize her success with intensive lifestyle modifications for her multiple health conditions.  I, Susan Dorsey, am acting as transcriptionist for Susan Level, PA-C  I have reviewed the above documentation for accuracy and completeness, and I agree with the above. -Susan Level, PA-C  I have reviewed the above note and agree with the plan. -Quillian Quince, MD     Today's visit was # 5 out of 22.  Starting weight: 248 lbs Starting date: 07/14/17 Today's weight : 233 lbs Today's date:  10/16/2017 Total lbs lost to date: 15 (  Patients must lose 7 lbs in the first 6 months to continue with counseling)   ASK: We discussed the diagnosis of obesity with Nikkole Jarome Matin today and Janneth agreed to give [place] permission to discuss obesity behavioral modification therapy today.  ASSESS: Veatrice has the diagnosis of obesity and her BMI today is 22 Cyerra is in the action stage of change   ADVISE: Kenyonna was educated on the multiple health risks of obesity as well as the benefit of weight loss to improve her health. She was advised of the need for long term treatment and the importance of lifestyle modifications.  AGREE: Multiple dietary modification options and treatment options were discussed and  Marlita agreed to keep a food journal with 1300-1500 calories and 80 grams of protein daily We discussed the following Behavioral Modification Strategies today: increasing lean protein intake and work on meal planning and easy cooking plans

## 2017-10-17 LAB — LIPID PANEL WITH LDL/HDL RATIO
Cholesterol, Total: 207 mg/dL — ABNORMAL HIGH (ref 100–199)
HDL: 44 mg/dL (ref >39)
LDL Calculated: 146 mg/dL — ABNORMAL HIGH (ref 0–99)
LDl/HDL Ratio: 3.3 ratio — ABNORMAL HIGH (ref 0.0–3.2)
Triglycerides: 86 mg/dL (ref 0–149)
VLDL Cholesterol Cal: 17 mg/dL (ref 5–40)

## 2017-10-17 LAB — COMPREHENSIVE METABOLIC PANEL
ALT: 19 IU/L (ref 0–32)
AST: 20 IU/L (ref 0–40)
Albumin/Globulin Ratio: 1.7 (ref 1.2–2.2)
Albumin: 4.3 g/dL (ref 3.5–5.5)
Alkaline Phosphatase: 76 IU/L (ref 39–117)
BUN/Creatinine Ratio: 14 (ref 9–23)
BUN: 12 mg/dL (ref 6–24)
Bilirubin Total: 0.4 mg/dL (ref 0.0–1.2)
CO2: 23 mmol/L (ref 20–29)
Calcium: 9.3 mg/dL (ref 8.7–10.2)
Chloride: 100 mmol/L (ref 96–106)
Creatinine, Ser: 0.88 mg/dL (ref 0.57–1.00)
GFR calc Af Amer: 86 mL/min/{1.73_m2} (ref >59)
GFR calc non Af Amer: 75 mL/min/{1.73_m2} (ref >59)
Globulin, Total: 2.6 g/dL (ref 1.5–4.5)
Glucose: 74 mg/dL (ref 65–99)
Potassium: 4.3 mmol/L (ref 3.5–5.2)
Sodium: 138 mmol/L (ref 134–144)
Total Protein: 6.9 g/dL (ref 6.0–8.5)

## 2017-10-17 LAB — VITAMIN D 25 HYDROXY (VIT D DEFICIENCY, FRACTURES): Vit D, 25-Hydroxy: 54.3 ng/mL (ref 30.0–100.0)

## 2017-10-23 DIAGNOSIS — Z1231 Encounter for screening mammogram for malignant neoplasm of breast: Secondary | ICD-10-CM | POA: Diagnosis not present

## 2017-11-13 ENCOUNTER — Ambulatory Visit (INDEPENDENT_AMBULATORY_CARE_PROVIDER_SITE_OTHER): Payer: 59 | Admitting: Physician Assistant

## 2017-11-13 VITALS — BP 127/79 | HR 62 | Temp 97.7°F | Ht 66.0 in | Wt 234.0 lb

## 2017-11-13 DIAGNOSIS — E559 Vitamin D deficiency, unspecified: Secondary | ICD-10-CM

## 2017-11-13 DIAGNOSIS — Z6837 Body mass index (BMI) 37.0-37.9, adult: Secondary | ICD-10-CM | POA: Diagnosis not present

## 2017-11-13 DIAGNOSIS — Z9189 Other specified personal risk factors, not elsewhere classified: Secondary | ICD-10-CM

## 2017-11-13 DIAGNOSIS — E7849 Other hyperlipidemia: Secondary | ICD-10-CM

## 2017-11-13 MED ORDER — VITAMIN D (ERGOCALCIFEROL) 1.25 MG (50000 UNIT) PO CAPS: 50000.0000 [IU] | ORAL_CAPSULE | ORAL | 0 refills | Status: DC

## 2017-11-13 NOTE — Progress Notes (Signed)
Office: [phone]  /  Fax: [phone]   HPI:   Chief Complaint: OBESITY Susan Dorsey is here to discuss her progress with her obesity treatment plan. She is on the keep a food journal with 1300 to 1500 calories and 80 grams of protein daily and is following her eating plan approximately 75 % of the time. She states she is exercising 0 minutes 0 times per week. [name] had increased celebration eating and she traveled more, and she got off the plan on occasions. She continues to be mindful of her eating and makes smarter food choices.  Susan Dorsey would like holiday eating strategies. Her weight is 234 lb (106.1 kg) today and has had a weight gain of 1 pound over a period of 4 weeks since her last visit. She has lost 14 lbs since starting treatment with us.  Hyperlipidemia Susan Dorsey has hyperlipidemia. Her LDL is 147 and she has a positive family history of hyperlipidemia. She is not on statins and she declines starting medications today. She started taking cholesterol lowering supplements and she has been trying to improve her cholesterol levels with intensive lifestyle modification including a low saturated fat diet, exercise and weight loss. She denies any chest pain, claudication or myalgias.  At risk for cardiovascular disease Susan Dorsey is at a higher than average risk for cardiovascular disease due to obesity and hyperlipidemia. She currently denies any chest pain.  Vitamin D deficiency Susan Dorsey has a diagnosis of vitamin D deficiency. She is currently taking vit D and denies nausea, vomiting or muscle weakness.   ALLERGIES: Allergies  Allergen Reactions  . Aspirin Swelling    Lips only.  . Chocolate   . Gluten Meal     MEDICATIONS: Current Outpatient Medications on File Prior to Visit  Medication Sig Dispense Refill  . Biotin 1000 MCG CHEW Chew by mouth daily.    . calcium-vitamin D (OSCAL WITH D) 500-200 MG-UNIT per tablet Take 2 tablets by mouth 2 (two) times daily.     . cetirizine (ZYRTEC) 10 MG  tablet Take 10 mg by mouth daily.     . DHA-EPA-Vitamin E (OMEGA-3 COMPLEX PO) Take by mouth daily.    . diphenhydrAMINE (BENADRYL) 25 MG tablet Take 25 mg by mouth at bedtime as needed.    Marland Kitchen. FIBER ADULT GUMMIES 2 g CHEW Chew 2 tablets by mouth 2 (two) times daily.    . fluticasone (FLONASE) 50 MCG/ACT nasal spray Place 2 sprays into the nose daily.     . LOW-OGESTREL 0.3-30 MG-MCG tablet Take 1 tablet by mouth daily.     . Magnesium 250 MG TABS Take 1 tablet by mouth daily.    . Melatonin 5 MG CAPS Take by mouth daily.    . Multiple Vitamin (MULTIVITAMIN WITH MINERALS) TABS Take 1 tablet by mouth daily.    Marland Kitchen. omeprazole (PRILOSEC) 40 MG capsule Take 40 mg by mouth 2 (two) times daily.     . Plant Sterols and Stanols (CHOLEST OFF PO) Take 1 tablet 2 (two) times daily by mouth.    Marland Kitchen. VITAMIN B COMPLEX-C CAPS Take 1 capsule by mouth daily.     No current facility-administered medications on file prior to visit.     PAST MEDICAL HISTORY: Past Medical History:  Diagnosis Date  . Allergy   . Anemia   . B12 deficiency   . Chronic kidney disease    hx of uti   . Gallbladder problem   . GERD (gastroesophageal reflux disease)   . Gluten  intolerance   . Gluten intolerance   . Hyperlipidemia    borderline  . Hypertension    Resolved after LAGB  . Joint pain   . Keratoconus   . Morbid obesity (HCC)   . Obesity   . Peeling of nails   . Swallowing difficulty   . Swelling    feet and legs  . TMJ (dislocation of temporomandibular joint)   . Vitamin D deficiency   . Wears glasses   . White coat syndrome with diagnosis of hypertension     PAST SURGICAL HISTORY: Past Surgical History:  Procedure Laterality Date  . CHOLECYSTECTOMY  1990's   Patient unsure of date  . LAPAROSCOPIC GASTRIC BANDING  01/23/2007  . PANNICULECTOMY N/A 09/24/2012   [facility]Martin[facility] B[facility]  . TONSILECTOMY, ADENOIDECTOMY, BILATERAL MYRINGOTOMY AND TUBES  1972    SOCIAL HISTORY: Social  History   Tobacco Use  . Smoking status: Never Smoker  . Smokeless tobacco: Never Used  Substance Use Topics  . Alcohol use: No  . Drug use: No    FAMILY HISTORY: Family History  Problem Relation Age of Onset  . Hypertension Mother   . Arthritis Mother   . Diabetes Mother   . Hyperlipidemia Mother   . Heart disease Mother   . Depression Mother   . Anxiety disorder Mother   . Sleep apnea Mother   . Eating disorder Mother   . Obstructive Sleep Apnea Mother   . Obesity Mother   . Cancer Father        lliver, pancreas, stomach  . Liver disease Father   . Chronic fatigue Sister     ROS: Review of Systems  Constitutional: Negative for weight loss.  Cardiovascular: Negative for chest pain and claudication.  Gastrointestinal: Negative for nausea and vomiting.  Musculoskeletal: Negative for myalgias.       Negative muscle weakness    PHYSICAL EXAM: Blood pressure 127/79, pulse 62, temperature 97.7 F (36.5 C), temperature source Oral, height 5\' 6"  (1.676 m), weight 234 lb (106.1 kg), last menstrual period 11/08/2017, SpO2 95 %. Body mass index is 37.77 kg/m. Physical Exam  Constitutional: She is oriented to person, place, and time. She appears well-developed and well-nourished.  Cardiovascular: Normal rate.  Pulmonary/Chest: Effort normal.  Musculoskeletal: Normal range of motion.  Neurological: She is oriented to person, place, and time.  Skin: Skin is warm and dry.  Psychiatric: She has a normal mood and affect. Her behavior is normal.  Vitals reviewed.   RECENT LABS AND TESTS: BMET    Component Value Date/Time   NA 138 10/16/2017 0852   K 4.3 10/16/2017 0852   CL 100 10/16/2017 0852   CO2 23 10/16/2017 0852   GLUCOSE 74 10/16/2017 0852   GLUCOSE 85 09/26/2012 0414   BUN 12 10/16/2017 0852   CREATININE 0.88 10/16/2017 0852   CALCIUM 9.3 10/16/2017 0852   GFRNONAA 75 10/16/2017 0852   GFRAA 86 10/16/2017 0852   Lab Results  Component Value Date    HGBA1C 5.1 07/14/2017   Lab Results  Component Value Date   INSULIN 6.0 07/14/2017   CBC    Component Value Date/Time   WBC 11.7 (H) 07/14/2017 0935   WBC 9.7 09/26/2012 0414   RBC 4.64 07/14/2017 0935   RBC 4.19 09/26/2012 0414   HGB 13.1 07/14/2017 0935   HCT 41.1 07/14/2017 0935   PLT 228 09/26/2012 0414   MCV 89 07/14/2017 0935   MCH  28.2 07/14/2017 0935   MCH 29.6 09/26/2012 0414   MCHC 31.9 07/14/2017 0935   MCHC 32.7 09/26/2012 0414   RDW 16.2 (H) 07/14/2017 0935   LYMPHSABS 1.5 07/14/2017 0935   EOSABS 0.1 07/14/2017 0935   BASOSABS 0.0 07/14/2017 0935   Iron/TIBC/Ferritin/ %Sat No results found for: IRON, TIBC, FERRITIN, IRONPCTSAT Lipid Panel     Component Value Date/Time   CHOL 207 (H) 10/16/2017 0852   TRIG 86 10/16/2017 0852   HDL 44 10/16/2017 0852   LDLCALC 146 (H) 10/16/2017 0852   Hepatic Function Panel     Component Value Date/Time   PROT 6.9 10/16/2017 0852   ALBUMIN 4.3 10/16/2017 0852   AST 20 10/16/2017 0852   ALT 19 10/16/2017 0852   ALKPHOS 76 10/16/2017 0852   BILITOT 0.4 10/16/2017 0852      Component Value Date/Time   TSH 2.560 07/14/2017 0935    ASSESSMENT AND PLAN: Vitamin D deficiency - Plan: Vitamin D, Ergocalciferol, (DRISDOL) 50000 units CAPS capsule  Other hyperlipidemia  At risk for heart disease  Class 2 severe obesity with serious comorbidity and body mass index (BMI) of 37.0 to 37.9 in adult, unspecified obesity type (HCC)  PLAN:  Hyperlipidemia Maci was informed of the American Heart Association Guidelines emphasizing intensive lifestyle modifications as the first line treatment for hyperlipidemia. We discussed many lifestyle modifications today in depth, and Teeghan will continue to work on decreasing saturated fats such as fatty red meat, butter and many fried foods. She will also increase vegetables and lean protein in her diet and continue to work on exercise and weight loss efforts.  Cardiovascular risk  counseling Chaeli was given extended (15 minutes) coronary artery disease prevention counseling today. She is 56 y.o. female and has risk factors for heart disease including obesity and hyperlipidemia. We discussed intensive lifestyle modifications today with an emphasis on specific weight loss instructions and strategies. Pt was also informed of the importance of increasing exercise and decreasing saturated fats to help prevent heart disease.  Vitamin D Deficiency Pranavi was informed that low vitamin D levels contributes to fatigue and are associated with obesity, breast, and colon cancer. She agrees to continue to take prescription Vit D @50 ,000 IU every 2 weeks #2 with no refills and will follow up for routine testing of vitamin D, at least 2-3 times per year. She was informed of the risk of over-replacement of vitamin D and agrees to not increase her dose unless he discusses this with [place] first. Sapphira agrees to follow up with our clinic in 8 weeks.  Obesity Aibhlinn is currently in the action stage of change. As such, her goal is to continue with weight loss efforts She has agreed to keep a food journal with 1500 calories and 80 grams of protein daily Latorsha has been instructed to work up to a goal of 150 minutes of combined cardio and strengthening exercise per week for weight loss and overall health benefits. We discussed the following Behavioral Modification Strategies today: increasing lean protein intake and holiday eating strategies   Trany has agreed to follow up with our clinic in 8 weeks. She was informed of the importance of frequent follow up visits to maximize her success with intensive lifestyle modifications for her multiple health conditions.  I, [place] Crane, am acting as transcriptionist for Illa Level, PA-C  I have reviewed the above documentation for accuracy and completeness, and I agree with the above. -Illa Level, PA-C  I have reviewed the above  note and agree with the plan. -Quillian Quince, MD   OBESITY BEHAVIORAL INTERVENTION VISIT  Today's visit was # 6 out of 22.  Starting weight: 248 lbs Starting date: 07/14/17 Today's weight : 234 lbs Today's date: 11/13/2017 Total lbs lost to date: 14 (Patients must lose 7 lbs in the first 6 months to continue with counseling)   ASK: We discussed the diagnosis of obesity with Donni Jarome Matin today and Kemonie agreed to give [place] permission to discuss obesity behavioral modification therapy today.  ASSESS: Lyncoln has the diagnosis of obesity and her BMI today is 37.79 Julyanna is in the action stage of change   ADVISE: Nicolena was educated on the multiple health risks of obesity as well as the benefit of weight loss to improve her health. She was advised of the need for long term treatment and the importance of lifestyle modifications.  AGREE: Multiple dietary modification options and treatment options were discussed and  Stormy agreed to keep a food journal with 1500 calories and 80 grams of protein daily We discussed the following Behavioral Modification Strategies today: increasing lean protein intake and holiday eating strategies

## 2017-12-08 DIAGNOSIS — Z0000 Encounter for general adult medical examination without abnormal findings: Secondary | ICD-10-CM | POA: Diagnosis not present

## 2018-01-02 DIAGNOSIS — M1712 Unilateral primary osteoarthritis, left knee: Secondary | ICD-10-CM | POA: Diagnosis not present

## 2018-01-02 DIAGNOSIS — M25562 Pain in left knee: Secondary | ICD-10-CM | POA: Diagnosis not present

## 2018-01-08 ENCOUNTER — Ambulatory Visit (INDEPENDENT_AMBULATORY_CARE_PROVIDER_SITE_OTHER): Payer: 59 | Admitting: Physician Assistant

## 2018-01-08 VITALS — BP 126/86 | HR 68 | Temp 97.9°F | Ht 66.0 in | Wt 238.0 lb

## 2018-01-08 DIAGNOSIS — E538 Deficiency of other specified B group vitamins: Secondary | ICD-10-CM | POA: Diagnosis not present

## 2018-01-08 DIAGNOSIS — E7849 Other hyperlipidemia: Secondary | ICD-10-CM

## 2018-01-08 DIAGNOSIS — Z9189 Other specified personal risk factors, not elsewhere classified: Secondary | ICD-10-CM

## 2018-01-08 DIAGNOSIS — E559 Vitamin D deficiency, unspecified: Secondary | ICD-10-CM | POA: Diagnosis not present

## 2018-01-08 DIAGNOSIS — R5383 Other fatigue: Secondary | ICD-10-CM | POA: Diagnosis not present

## 2018-01-08 DIAGNOSIS — Z6838 Body mass index (BMI) 38.0-38.9, adult: Secondary | ICD-10-CM | POA: Diagnosis not present

## 2018-01-08 MED ORDER — VITAMIN D (ERGOCALCIFEROL) 1.25 MG (50000 UNIT) PO CAPS: ORAL_CAPSULE | ORAL | 0 refills | Status: DC

## 2018-01-08 NOTE — Progress Notes (Addendum)
Office: [phone]  /  Fax: [phone]   HPI:   Chief Complaint: OBESITY Susan Dorsey is here to discuss her progress with her obesity treatment plan. She is on the  keep a food journal with 1500 calories and 80 grams of protein daily and is following her eating plan approximately 65 % of the time. She states she is exercising 0 minutes 0 times per week. Susan Dorsey has had an increase in emotional and boredom eating as she has not been exercising as much due to injuring her knee. She is motivated to get back on track and continue with weight loss. Her weight is 238 lb (108 kg) today and has had a weight gain of 4 pounds over a period of 8 weeks since her last visit. She has lost 10 lbs since starting treatment with [place].  Vitamin D deficiency Susan Dorsey has a diagnosis of vitamin D deficiency. She is currently taking vit D and denies nausea, vomiting or muscle weakness.   Ref. Range 10/16/2017 08:52  Vitamin D, 25-Hydroxy Latest Ref Range: 30.0 - 100.0 ng/mL 54.3   Hyperlipidemia Susan Dorsey has hyperlipidemia and has been trying to improve her cholesterol levels with intensive lifestyle modification including a low saturated fat diet, exercise and weight loss. She denies any chest pain or claudication. Susan Dorsey refuses cholesterol medications.   At risk for cardiovascular disease Susan Dorsey is at a higher than average risk for cardiovascular disease due to obesity and hyperlipidemia. She currently denies any chest pain.  ALLERGIES: Allergies  Allergen Reactions  . Aspirin Swelling    Lips only.  . Chocolate   . Gluten Meal     MEDICATIONS: Current Outpatient Medications on File Prior to Visit  Medication Sig Dispense Refill  . Biotin 1000 MCG CHEW Chew by mouth daily.    . calcium-vitamin D (OSCAL WITH D) 500-200 MG-UNIT per tablet Take 2 tablets by mouth 2 (two) times daily.     . cetirizine (ZYRTEC) 10 MG tablet Take 10 mg by mouth daily.     . DHA-EPA-Vitamin E (OMEGA-3 COMPLEX PO) Take by mouth daily.    .  diphenhydrAMINE (BENADRYL) 25 MG tablet Take 25 mg by mouth at bedtime as needed.    Marland Kitchen FIBER ADULT GUMMIES 2 g CHEW Chew 2 tablets by mouth 2 (two) times daily.    . fluticasone (FLONASE) 50 MCG/ACT nasal spray Place 2 sprays into the nose daily.     . LOW-OGESTREL 0.3-30 MG-MCG tablet Take 1 tablet by mouth daily.     . Magnesium 250 MG TABS Take 1 tablet by mouth daily.    . Melatonin 5 MG CAPS Take by mouth daily.    . Multiple Vitamin (MULTIVITAMIN WITH MINERALS) TABS Take 1 tablet by mouth daily.    Marland Kitchen omeprazole (PRILOSEC) 40 MG capsule Take 40 mg by mouth 2 (two) times daily.     . Plant Sterols and Stanols (CHOLEST OFF PO) Take 1 tablet 2 (two) times daily by mouth.    Marland Kitchen VITAMIN B COMPLEX-C CAPS Take 1 capsule by mouth daily.     No current facility-administered medications on file prior to visit.     PAST MEDICAL HISTORY: Past Medical History:  Diagnosis Date  . Allergy   . Anemia   . B12 deficiency   . Chronic kidney disease    hx of uti   . Gallbladder problem   . GERD (gastroesophageal reflux disease)   . Gluten intolerance   . Gluten intolerance   . Hyperlipidemia  borderline  . Hypertension    Resolved after LAGB  . Joint pain   . Keratoconus   . Morbid obesity (HCC)   . Obesity   . Peeling of nails   . Swallowing difficulty   . Swelling    feet and legs  . TMJ (dislocation of temporomandibular joint)   . Vitamin D deficiency   . Wears glasses   . White coat syndrome with diagnosis of hypertension     PAST SURGICAL HISTORY: Past Surgical History:  Procedure Laterality Date  . CHOLECYSTECTOMY  1990's   Patient unsure of date  . LAPAROSCOPIC GASTRIC BANDING  01/23/2007  . PANNICULECTOMY  09/24/2012   Procedure: PANNICULECTOMY;  Surgeon: Valarie MerinoMatthew B Martin, MD;  Location: [facility];  Service: General;  Laterality: N/A;  . TONSILECTOMY, ADENOIDECTOMY, BILATERAL MYRINGOTOMY AND TUBES  1972    SOCIAL HISTORY: Social History   Tobacco Use  . Smoking status:  Never Smoker  . Smokeless tobacco: Never Used  Substance Use Topics  . Alcohol use: No  . Drug use: No    FAMILY HISTORY: Family History  Problem Relation Age of Onset  . Hypertension Mother   . Arthritis Mother   . Diabetes Mother   . Hyperlipidemia Mother   . Heart disease Mother   . Depression Mother   . Anxiety disorder Mother   . Sleep apnea Mother   . Eating disorder Mother   . Obstructive Sleep Apnea Mother   . Obesity Mother   . Cancer Father        lliver, pancreas, stomach  . Liver disease Father   . Chronic fatigue Sister     ROS: Review of Systems  Constitutional: Negative for weight loss.  Cardiovascular: Negative for chest pain and claudication.  Gastrointestinal: Negative for nausea and vomiting.  Musculoskeletal:       Negative muscle weakness    PHYSICAL EXAM: Blood pressure 126/86, pulse 68, temperature 97.9 F (36.6 C), temperature source Oral, height 5\' 6"  (1.676 m), weight 238 lb (108 kg), last menstrual period 01/01/2018, SpO2 100 %. Body mass index is 38.41 kg/m. Physical Exam  Constitutional: She is oriented to person, place, and time. She appears well-developed and well-nourished.  Cardiovascular: Normal rate.  Pulmonary/Chest: Effort normal.  Musculoskeletal: Normal range of motion.  Neurological: She is oriented to person, place, and time.  Skin: Skin is warm and dry.  Psychiatric: She has a normal mood and affect. Her behavior is normal.  Vitals reviewed.   RECENT LABS AND TESTS: BMET    Component Value Date/Time   NA 138 10/16/2017 0852   K 4.3 10/16/2017 0852   CL 100 10/16/2017 0852   CO2 23 10/16/2017 0852   GLUCOSE 74 10/16/2017 0852   GLUCOSE 85 09/26/2012 0414   BUN 12 10/16/2017 0852   CREATININE 0.88 10/16/2017 0852   CALCIUM 9.3 10/16/2017 0852   GFRNONAA 75 10/16/2017 0852   GFRAA 86 10/16/2017 0852   Lab Results  Component Value Date   HGBA1C 5.1 07/14/2017   Lab Results  Component Value Date   INSULIN  6.0 07/14/2017   CBC    Component Value Date/Time   WBC 11.7 (H) 07/14/2017 0935   WBC 9.7 09/26/2012 0414   RBC 4.64 07/14/2017 0935   RBC 4.19 09/26/2012 0414   HGB 13.1 07/14/2017 0935   HCT 41.1 07/14/2017 0935   PLT 228 09/26/2012 0414   MCV 89 07/14/2017 0935   MCH 28.2 07/14/2017 0935   MCH 29.6  09/26/2012 0414   MCHC 31.9 07/14/2017 0935   MCHC 32.7 09/26/2012 0414   RDW 16.2 (H) 07/14/2017 0935   LYMPHSABS 1.5 07/14/2017 0935   EOSABS 0.1 07/14/2017 0935   BASOSABS 0.0 07/14/2017 0935   Iron/TIBC/Ferritin/ %Sat No results found for: IRON, TIBC, FERRITIN, IRONPCTSAT Lipid Panel     Component Value Date/Time   CHOL 207 (H) 10/16/2017 0852   TRIG 86 10/16/2017 0852   HDL 44 10/16/2017 0852   LDLCALC 146 (H) 10/16/2017 0852   Hepatic Function Panel     Component Value Date/Time   PROT 6.9 10/16/2017 0852   ALBUMIN 4.3 10/16/2017 0852   AST 20 10/16/2017 0852   ALT 19 10/16/2017 0852   ALKPHOS 76 10/16/2017 0852   BILITOT 0.4 10/16/2017 0852      Component Value Date/Time   TSH 2.560 07/14/2017 0935     Ref. Range 10/16/2017 08:52  Vitamin D, 25-Hydroxy Latest Ref Range: 30.0 - 100.0 ng/mL 54.3    ASSESSMENT AND PLAN: Vitamin D deficiency - Plan: Comprehensive metabolic panel, VITAMIN D 25 Hydroxy (Vit-D Deficiency, Fractures), Vitamin D, Ergocalciferol, (DRISDOL) 50000 units CAPS capsule  Other hyperlipidemia - Plan: Comprehensive metabolic panel, Lipid Panel With LDL/HDL Ratio  At risk for heart disease  Class 2 severe obesity with serious comorbidity and body mass index (BMI) of 38.0 to 38.9 in adult, unspecified obesity type (HCC)  PLAN:  Vitamin D Deficiency Susan Dorsey was informed that low vitamin D levels contributes to fatigue and are associated with obesity, breast, and colon cancer. She agrees to continue to take prescription Vit D @50 ,000 IU every 14 days #2 with no refills. We will check labs and will follow up for routine testing of vitamin D,  at least 2-3 times per year. She was informed of the risk of over-replacement of vitamin D and agrees to not increase her dose unless he discusses this with [place] first. Susan Dorsey agrees to follow up with our clinic in 4 weeks.  Hyperlipidemia Susan Dorsey was informed of the American Heart Association Guidelines emphasizing intensive lifestyle modifications as the first line treatment for hyperlipidemia. We discussed many lifestyle modifications today in depth, and Susan Dorsey will continue to work on decreasing saturated fats such as fatty red meat, butter and many fried foods. She will also increase vegetables and lean protein in her diet and continue to work on exercise and weight loss efforts. We will check labs and Carianna agrees to follow up as directed.  Cardiovascular risk counseling Susan Dorsey was given extended (15 minutes) coronary artery disease prevention counseling today. She is 57 y.o. female and has risk factors for heart disease including obesity and hyperlipidemia. We discussed intensive lifestyle modifications today with an emphasis on specific weight loss instructions and strategies. Pt was also informed of the importance of increasing exercise and decreasing saturated fats to help prevent heart disease.  Obesity Susan Dorsey is currently in the action stage of change. As such, her goal is to continue with weight loss efforts She has agreed to keep a food journal with 1500 calories and 80 grams of protein daily Susan Dorsey has been instructed to work up to a goal of 150 minutes of combined cardio and strengthening exercise per week for weight loss and overall health benefits. We discussed the following Behavioral Modification Strategies today: increasing lean protein intake and keep a strict food journal  Channel has agreed to follow up with our clinic in 4 weeks. She was informed of the importance of frequent follow up visits to  maximize her success with intensive lifestyle modifications for her multiple health conditions.  Susan Dorsey Loron, am acting as transcriptionist for Illa Level, PA-C I Illa Level [facility] have reviewed this note and agree with its content   OBESITY BEHAVIORAL INTERVENTION VISIT  Today's visit was # 7 out of 22.  Starting weight: 248 lbs Starting date: 07/14/17 Today's weight : 238 lbs  Today's date: 01/08/2018 Total lbs lost to date: 10 (Patients must lose 7 lbs in the first 6 months to continue with counseling)   ASK: We discussed the diagnosis of obesity with Susan Dorsey today and Susan Dorsey agreed to give [place] permission to discuss obesity behavioral modification therapy today.  ASSESS: Susan Dorsey has the diagnosis of obesity and her BMI today is 38.43 Susan Dorsey is in the action stage of change   ADVISE: Susan Dorsey was educated on the multiple health risks of obesity as well as the benefit of weight loss to improve her health. She was advised of the need for long term treatment and the importance of lifestyle modifications.  AGREE: Multiple dietary modification options and treatment options were discussed and  Susan Dorsey agreed to the above obesity treatment plan.  I have reviewed the above documentation for accuracy and completeness, and I agree with the above. -Quillian Quince, MD

## 2018-01-09 LAB — LIPID PANEL WITH LDL/HDL RATIO
Cholesterol, Total: 192 mg/dL (ref 100–199)
HDL: 49 mg/dL (ref >39)
LDL Calculated: 127 mg/dL — ABNORMAL HIGH (ref 0–99)
LDl/HDL Ratio: 2.6 ratio (ref 0.0–3.2)
Triglycerides: 79 mg/dL (ref 0–149)
VLDL Cholesterol Cal: 16 mg/dL (ref 5–40)

## 2018-01-09 LAB — CBC WITH DIFFERENTIAL
Basophils Absolute: 0.1 10*3/uL (ref 0.0–0.2)
Basos: 1 %
EOS (ABSOLUTE): 0.1 10*3/uL (ref 0.0–0.4)
Eos: 2 %
Hematocrit: 41.8 % (ref 34.0–46.6)
Hemoglobin: 13.5 g/dL (ref 11.1–15.9)
Immature Grans (Abs): 0 10*3/uL (ref 0.0–0.1)
Immature Granulocytes: 0 %
Lymphocytes Absolute: 2.2 10*3/uL (ref 0.7–3.1)
Lymphs: 31 %
MCH: 27.6 pg (ref 26.6–33.0)
MCHC: 32.3 g/dL (ref 31.5–35.7)
MCV: 85 fL (ref 79–97)
Monocytes Absolute: 0.5 10*3/uL (ref 0.1–0.9)
Monocytes: 6 %
Neutrophils Absolute: 4.3 10*3/uL (ref 1.4–7.0)
Neutrophils: 60 %
RBC: 4.9 x10E6/uL (ref 3.77–5.28)
RDW: 15.9 % — ABNORMAL HIGH (ref 12.3–15.4)
WBC: 7.2 10*3/uL (ref 3.4–10.8)

## 2018-01-09 LAB — FOLATE: Folate: >20 ng/mL (ref >3.0)

## 2018-01-09 LAB — COMPREHENSIVE METABOLIC PANEL
ALT: 18 IU/L (ref 0–32)
AST: 19 IU/L (ref 0–40)
Albumin/Globulin Ratio: 1.6 (ref 1.2–2.2)
Albumin: 4.3 g/dL (ref 3.5–5.5)
Alkaline Phosphatase: 83 IU/L (ref 39–117)
BUN/Creatinine Ratio: 17 (ref 9–23)
BUN: 14 mg/dL (ref 6–24)
Bilirubin Total: 0.2 mg/dL (ref 0.0–1.2)
CO2: 21 mmol/L (ref 20–29)
Calcium: 9.5 mg/dL (ref 8.7–10.2)
Chloride: 105 mmol/L (ref 96–106)
Creatinine, Ser: 0.84 mg/dL (ref 0.57–1.00)
GFR calc Af Amer: 91 mL/min/{1.73_m2} (ref >59)
GFR calc non Af Amer: 79 mL/min/{1.73_m2} (ref >59)
Globulin, Total: 2.7 g/dL (ref 1.5–4.5)
Glucose: 77 mg/dL (ref 65–99)
Potassium: 4.9 mmol/L (ref 3.5–5.2)
Sodium: 141 mmol/L (ref 134–144)
Total Protein: 7 g/dL (ref 6.0–8.5)

## 2018-01-09 LAB — T3: T3, Total: 103 ng/dL (ref 71–180)

## 2018-01-09 LAB — VITAMIN D 25 HYDROXY (VIT D DEFICIENCY, FRACTURES): Vit D, 25-Hydroxy: 44.8 ng/mL (ref 30.0–100.0)

## 2018-01-09 LAB — HEMOGLOBIN A1C
Est. average glucose Bld gHb Est-mCnc: 103 mg/dL
Hgb A1c MFr Bld: 5.2 % (ref 4.8–5.6)

## 2018-01-09 LAB — VITAMIN B12: Vitamin B-12: 1225 pg/mL (ref 232–1245)

## 2018-01-09 LAB — TSH: TSH: 2.5 u[IU]/mL (ref 0.450–4.500)

## 2018-01-09 LAB — INSULIN, RANDOM: INSULIN: 5.6 u[IU]/mL (ref 2.6–24.9)

## 2018-01-09 LAB — T4, FREE: Free T4: 1.43 ng/dL (ref 0.82–1.77)

## 2018-01-29 DIAGNOSIS — J069 Acute upper respiratory infection, unspecified: Secondary | ICD-10-CM | POA: Diagnosis not present

## 2018-02-05 ENCOUNTER — Ambulatory Visit (INDEPENDENT_AMBULATORY_CARE_PROVIDER_SITE_OTHER): Payer: 59 | Admitting: Physician Assistant

## 2018-02-12 DIAGNOSIS — R5383 Other fatigue: Secondary | ICD-10-CM | POA: Diagnosis not present

## 2018-02-12 DIAGNOSIS — J40 Bronchitis, not specified as acute or chronic: Secondary | ICD-10-CM | POA: Diagnosis not present

## 2018-02-15 ENCOUNTER — Ambulatory Visit (INDEPENDENT_AMBULATORY_CARE_PROVIDER_SITE_OTHER): Payer: 59 | Admitting: Physician Assistant

## 2018-02-15 VITALS — BP 124/81 | HR 65 | Temp 98.1°F | Ht 66.0 in | Wt 233.0 lb

## 2018-02-15 DIAGNOSIS — Z6837 Body mass index (BMI) 37.0-37.9, adult: Secondary | ICD-10-CM

## 2018-02-15 DIAGNOSIS — Z9189 Other specified personal risk factors, not elsewhere classified: Secondary | ICD-10-CM | POA: Diagnosis not present

## 2018-02-15 DIAGNOSIS — E7849 Other hyperlipidemia: Secondary | ICD-10-CM

## 2018-02-15 DIAGNOSIS — E559 Vitamin D deficiency, unspecified: Secondary | ICD-10-CM

## 2018-02-15 MED ORDER — VITAMIN D (ERGOCALCIFEROL) 1.25 MG (50000 UNIT) PO CAPS: ORAL_CAPSULE | ORAL | 0 refills | Status: DC

## 2018-02-15 NOTE — Progress Notes (Signed)
Office: [phone]  /  Fax: [phone]   HPI:   Chief Complaint: OBESITY Susan Dorsey is here to discuss her progress with her obesity treatment plan. She is on the  keep a food journal with 1500 calories and 80 grams of protein  and is following her eating plan approximately 80 % of the time. She states she is not exercising. Susan Dorsey has been sick with URI and has stayed within her caloric and protein goals. She states she has not been meeting her daily protein goals.  Her weight is 233 lb (105.7 kg) today and has had a weight loss of 5 pounds over a period of 4 weeks since her last visit. She has lost 15 lbs since starting treatment with [place].  Hyperlipidemia Susan Dorsey has hyperlipidemia and has been trying to improve her cholesterol levels with intensive lifestyle modification including a low saturated fat diet, exercise and weight loss. Her LDL improved to 127, however still greater than 100. She declines any cholesterol medications today. She denies any chest pain, claudication or myalgias.  Vitamin D deficiency Susan Dorsey has a diagnosis of vitamin D deficiency. She is currently taking vit D and denies nausea, vomiting or muscle weakness.   Ref. Range 01/08/2018 08:18  Vitamin D, 25-Hydroxy Latest Ref Range: 30.0 - 100.0 ng/mL 44.8   At risk for cardiovascular disease Susan Dorsey is at a higher than average risk for cardiovascular disease due to obesity. She currently denies any chest pain.  ALLERGIES: Allergies  Allergen Reactions  . Aspirin Swelling    Lips only.  . Chocolate   . Gluten Meal     MEDICATIONS: Current Outpatient Medications on File Prior to Visit  Medication Sig Dispense Refill  . Biotin 1000 MCG CHEW Chew by mouth daily.    . cetirizine (ZYRTEC) 10 MG tablet Take 10 mg by mouth daily.     . DHA-EPA-Vitamin E (OMEGA-3 COMPLEX PO) Take by mouth daily.    . diphenhydrAMINE (BENADRYL) 25 MG tablet Take 25 mg by mouth at bedtime as needed.    Marland Kitchen FIBER ADULT GUMMIES 2 g CHEW Chew 2 tablets  by mouth 2 (two) times daily.    . fluticasone (FLONASE) 50 MCG/ACT nasal spray Place 2 sprays into the nose daily.     . LOW-OGESTREL 0.3-30 MG-MCG tablet Take 1 tablet by mouth daily.     . Magnesium 250 MG TABS Take 1 tablet by mouth daily.    . Melatonin 5 MG CAPS Take by mouth daily.    . Multiple Vitamin (MULTIVITAMIN WITH MINERALS) TABS Take 1 tablet by mouth daily.    Marland Kitchen omeprazole (PRILOSEC) 40 MG capsule Take 40 mg by mouth 2 (two) times daily.     . Plant Sterols and Stanols (CHOLEST OFF PO) Take 1 tablet 2 (two) times daily by mouth.    Marland Kitchen VITAMIN B COMPLEX-C CAPS Take 1 capsule by mouth daily.     No current facility-administered medications on file prior to visit.     PAST MEDICAL HISTORY: Past Medical History:  Diagnosis Date  . Allergy   . Anemia   . B12 deficiency   . Chronic kidney disease    hx of uti   . Gallbladder problem   . GERD (gastroesophageal reflux disease)   . Gluten intolerance   . Gluten intolerance   . Hyperlipidemia    borderline  . Hypertension    Resolved after LAGB  . Joint pain   . Keratoconus   . Morbid obesity (HCC)   .  Obesity   . Peeling of nails   . Swallowing difficulty   . Swelling    feet and legs  . TMJ (dislocation of temporomandibular joint)   . Vitamin D deficiency   . Wears glasses   . White coat syndrome with diagnosis of hypertension     PAST SURGICAL HISTORY: Past Surgical History:  Procedure Laterality Date  . CHOLECYSTECTOMY  1990's   Patient unsure of date  . LAPAROSCOPIC GASTRIC BANDING  01/23/2007  . PANNICULECTOMY  09/24/2012   Procedure: PANNICULECTOMY;  Surgeon: Valarie Merino, MD;  Location: [facility];  Service: General;  Laterality: N/A;  . TONSILECTOMY, ADENOIDECTOMY, BILATERAL MYRINGOTOMY AND TUBES  1972    SOCIAL HISTORY: Social History   Tobacco Use  . Smoking status: Never Smoker  . Smokeless tobacco: Never Used  Substance Use Topics  . Alcohol use: No  . Drug use: No    FAMILY  HISTORY: Family History  Problem Relation Age of Onset  . Hypertension Mother   . Arthritis Mother   . Diabetes Mother   . Hyperlipidemia Mother   . Heart disease Mother   . Depression Mother   . Anxiety disorder Mother   . Sleep apnea Mother   . Eating disorder Mother   . Obstructive Sleep Apnea Mother   . Obesity Mother   . Cancer Father        lliver, pancreas, stomach  . Liver disease Father   . Chronic fatigue Sister     ROS: Review of Systems  Constitutional: Positive for weight loss.  Cardiovascular: Negative for chest pain and claudication.  Gastrointestinal: Negative for nausea and vomiting.  Musculoskeletal: Negative for myalgias.       Negative for muscle weakness    PHYSICAL EXAM: Blood pressure 124/81, pulse 65, temperature 98.1 F (36.7 C), height 5\' 6"  (1.676 m), weight 233 lb (105.7 kg), SpO2 100 %. Body mass index is 37.61 kg/m. Physical Exam  Constitutional: She is oriented to person, place, and time. She appears well-developed and well-nourished.  HENT:  Head: Normocephalic.  Eyes: EOM are normal.  Neck: Normal range of motion.  Cardiovascular: Normal rate.  Pulmonary/Chest: Effort normal.  Musculoskeletal: Normal range of motion.  Neurological: She is alert and oriented to person, place, and time.  Skin: Skin is warm and dry.  Psychiatric: She has a normal mood and affect. Her behavior is normal.  Vitals reviewed.   RECENT LABS AND TESTS: BMET    Component Value Date/Time   NA 141 01/08/2018 0818   K 4.9 01/08/2018 0818   CL 105 01/08/2018 0818   CO2 21 01/08/2018 0818   GLUCOSE 77 01/08/2018 0818   GLUCOSE 85 09/26/2012 0414   BUN 14 01/08/2018 0818   CREATININE 0.84 01/08/2018 0818   CALCIUM 9.5 01/08/2018 0818   GFRNONAA 79 01/08/2018 0818   GFRAA 91 01/08/2018 0818   Lab Results  Component Value Date   HGBA1C 5.2 01/08/2018   HGBA1C 5.1 07/14/2017   Lab Results  Component Value Date   INSULIN 5.6 01/08/2018   INSULIN 6.0  07/14/2017   CBC    Component Value Date/Time   WBC 7.2 01/08/2018 0818   WBC 9.7 09/26/2012 0414   RBC 4.90 01/08/2018 0818   RBC 4.19 09/26/2012 0414   HGB 13.5 01/08/2018 0818   HCT 41.8 01/08/2018 0818   PLT 228 09/26/2012 0414   MCV 85 01/08/2018 0818   MCH 27.6 01/08/2018 0818   MCH 29.6 09/26/2012 0414  MCHC 32.3 01/08/2018 0818   MCHC 32.7 09/26/2012 0414   RDW 15.9 (H) 01/08/2018 0818   LYMPHSABS 2.2 01/08/2018 0818   EOSABS 0.1 01/08/2018 0818   BASOSABS 0.1 01/08/2018 0818   Iron/TIBC/Ferritin/ %Sat No results found for: IRON, TIBC, FERRITIN, IRONPCTSAT Lipid Panel     Component Value Date/Time   CHOL 192 01/08/2018 0818   TRIG 79 01/08/2018 0818   HDL 49 01/08/2018 0818   LDLCALC 127 (H) 01/08/2018 0818   Hepatic Function Panel     Component Value Date/Time   PROT 7.0 01/08/2018 0818   ALBUMIN 4.3 01/08/2018 0818   AST 19 01/08/2018 0818   ALT 18 01/08/2018 0818   ALKPHOS 83 01/08/2018 0818   BILITOT 0.2 01/08/2018 0818      Component Value Date/Time   TSH 2.500 01/08/2018 0818   TSH 2.560 07/14/2017 0935    Ref. Range 01/08/2018 08:18  Vitamin D, 25-Hydroxy Latest Ref Range: 30.0 - 100.0 ng/mL 44.8    ASSESSMENT AND PLAN: Other hyperlipidemia  Vitamin D deficiency - Plan: Vitamin D, Ergocalciferol, (DRISDOL) 50000 units CAPS capsule  At risk for heart disease  Class 2 severe obesity with serious comorbidity and body mass index (BMI) of 37.0 to 37.9 in adult, unspecified obesity type (HCC)  PLAN: Hyperlipidemia Susan Dorsey was informed of the American Heart Association Guidelines emphasizing intensive lifestyle modifications as the first line treatment for hyperlipidemia. We discussed many lifestyle modifications today in depth, and Susan Dorsey will continue to work on decreasing saturated fats such as fatty red meat, butter and many fried foods. She will also increase vegetables and lean protein in her diet and continue to work on exercise and weight loss  efforts.  Vitamin D Deficiency Susan Dorsey was informed that low vitamin D levels contributes to fatigue and are associated with obesity, breast, and colon cancer. She agrees to continue to take prescription Vit D @50 ,000 IU every week #4 with no refills and will follow up for routine testing of vitamin D, at least 2-3 times per year. She was informed of the risk of over-replacement of vitamin D and agrees to not increase her dose unless she discusses this with [place] first.  Cardiovascular risk counseling Susan Dorsey was given extended (15 minutes) coronary artery disease prevention counseling today. She is 57 y.o. female and has risk factors for heart disease including obesity. We discussed intensive lifestyle modifications today with an emphasis on specific weight loss instructions and strategies. Pt was also informed of the importance of increasing exercise and decreasing saturated fats to help prevent heart disease.  Obesity Susan Dorsey is currently in the action stage of change. As such, her goal is to continue with weight loss efforts She has agreed to keep a food journal with 1500 calories and 80 grams of protein  Susan Dorsey has been instructed to work up to a goal of 150 minutes of combined cardio and strengthening exercise per week for weight loss and overall health benefits. We discussed the following Behavioral Modification Strategies today: increasing lean protein intake and keeping a strict food journal.    Susan Dorsey has agreed to follow up with our clinic in 3 weeks. She was informed of the importance of frequent follow up visits to maximize her success with intensive lifestyle modifications for her multiple health conditions.   Today's visit was # 8 out of 22.  Starting weight: 248 lbs Starting date: 07/14/17 Today's weight : 233 lbs Today's date: 02/15/2018 Total lbs lost to date: 15 (Patients must lose 7 lbs  in the first 6 months to continue with counseling)   ASK: We discussed the diagnosis of obesity with Susan Dorsey  Susan Dorsey today and Susan Dorsey agreed to give Susan Dorsey permission to discuss obesity behavioral modification therapy today.  ASSESS: Susan Dorsey has the diagnosis of obesity and her BMI today is 37.61 Susan Dorsey is in the action stage of change   ADVISE: Susan Dorsey was educated on the multiple health risks of obesity as well as the benefit of weight loss to improve her health. She was advised of the need for long term treatment and the importance of lifestyle modifications.  AGREE: Multiple dietary modification options and treatment options were discussed and  Susan Dorsey agreed to the above obesity treatment plan.   Otis PeakI, Ashleigh Haynes, am acting as transcriptionist for Solectron CorporationSahar Osman, PA-C I, Illa LevelSahar Osman [facility], have reviewed this note and agree with its content

## 2018-02-21 DIAGNOSIS — R5383 Other fatigue: Secondary | ICD-10-CM | POA: Diagnosis not present

## 2018-03-12 ENCOUNTER — Ambulatory Visit (INDEPENDENT_AMBULATORY_CARE_PROVIDER_SITE_OTHER): Payer: 59 | Admitting: Physician Assistant

## 2018-03-12 VITALS — BP 147/90 | HR 62 | Temp 97.9°F | Ht 66.0 in | Wt 238.0 lb

## 2018-03-12 DIAGNOSIS — E559 Vitamin D deficiency, unspecified: Secondary | ICD-10-CM

## 2018-03-12 DIAGNOSIS — Z9189 Other specified personal risk factors, not elsewhere classified: Secondary | ICD-10-CM | POA: Diagnosis not present

## 2018-03-12 DIAGNOSIS — I10 Essential (primary) hypertension: Secondary | ICD-10-CM | POA: Diagnosis not present

## 2018-03-12 DIAGNOSIS — Z6838 Body mass index (BMI) 38.0-38.9, adult: Secondary | ICD-10-CM

## 2018-03-12 MED ORDER — VITAMIN D (ERGOCALCIFEROL) 1.25 MG (50000 UNIT) PO CAPS: ORAL_CAPSULE | ORAL | 0 refills | Status: DC

## 2018-03-12 NOTE — Progress Notes (Addendum)
Office: [phone]  /  Fax: [phone]   HPI:   Chief Complaint: OBESITY Susan Dorsey is here to discuss her progress with her obesity treatment plan. She is on the keep a food journal with 1500 calories and 80 grams of protein daily and is following her eating plan approximately 50 % of the time. She states she is exercising 0 minutes 0 times per week. Susan Dorsey has been sick and has not been keeping a strict food journal. She states she is better and is motivated to get back on track. She requests a longer follow up time sue to work schedule conflict.  Her weight is 238 lb (108 kg) today and has gained 5 pounds since her last visit. She has lost 10 lbs since starting treatment with Susan Dorsey.  Vitamin D Deficiency Susan Dorsey has a diagnosis of vitamin D deficiency. She is currently taking prescription Vit D and denies nausea, vomiting or muscle weakness.  Hypertension Susan Dorsey is a 57 y.o. female with history of hypertension. She is not on any medications. Susan Dorsey continues to decline blood pressure medication and states her blood pressure at home is always stable. She denies chest pain or shortness of breath. She is working weight loss to help control her blood pressure with the goal of decreasing her risk of heart attack and stroke. Kare's blood pressure is not currently controlled.  At risk for cardiovascular disease Susan Dorsey is at a higher than average risk for cardiovascular disease due to obesity and hypertension. She currently denies any chest pain.  ALLERGIES: Allergies  Allergen Reactions  . Aspirin Swelling    Lips only.  . Chocolate   . Gluten Meal     MEDICATIONS: Current Outpatient Medications on File Prior to Visit  Medication Sig Dispense Refill  . Biotin 1000 MCG CHEW Chew by mouth daily.    . cetirizine (ZYRTEC) 10 MG tablet Take 10 mg by mouth daily.     . DHA-EPA-Vitamin E (OMEGA-3 COMPLEX PO) Take by mouth daily.    . diphenhydrAMINE (BENADRYL) 25 MG tablet Take 25 mg by mouth at  bedtime as needed.    Marland Kitchen. FIBER ADULT GUMMIES 2 g CHEW Chew 2 tablets by mouth 2 (two) times daily.    . fluticasone (FLONASE) 50 MCG/ACT nasal spray Place 2 sprays into the nose daily.     . LOW-OGESTREL 0.3-30 MG-MCG tablet Take 1 tablet by mouth daily.     . Magnesium 250 MG TABS Take 1 tablet by mouth daily.    . Melatonin 5 MG CAPS Take by mouth daily.    . Multiple Vitamin (MULTIVITAMIN WITH MINERALS) TABS Take 1 tablet by mouth daily.    Marland Kitchen. omeprazole (PRILOSEC) 40 MG capsule Take 40 mg by mouth 2 (two) times daily.     . Plant Sterols and Stanols (CHOLEST OFF PO) Take 1 tablet 2 (two) times daily by mouth.    Marland Kitchen. VITAMIN B COMPLEX-C CAPS Take 1 capsule by mouth daily.     No current facility-administered medications on file prior to visit.     PAST MEDICAL HISTORY: Past Medical History:  Diagnosis Date  . Allergy   . Anemia   . B12 deficiency   . Chronic kidney disease    hx of uti   . Gallbladder problem   . GERD (gastroesophageal reflux disease)   . Gluten intolerance   . Gluten intolerance   . Hyperlipidemia    borderline  . Hypertension    Resolved after LAGB  .  Joint pain   . Keratoconus   . Morbid obesity (HCC)   . Obesity   . Peeling of nails   . Swallowing difficulty   . Swelling    feet and legs  . TMJ (dislocation of temporomandibular joint)   . Vitamin D deficiency   . Wears glasses   . White coat syndrome with diagnosis of hypertension     PAST SURGICAL HISTORY: Past Surgical History:  Procedure Laterality Date  . CHOLECYSTECTOMY  1990's   Patient unsure of date  . LAPAROSCOPIC GASTRIC BANDING  01/23/2007  . PANNICULECTOMY  09/24/2012   Procedure: PANNICULECTOMY;  Surgeon: Valarie Merino, MD;  Location: [facility];  Service: General;  Laterality: N/A;  . TONSILECTOMY, ADENOIDECTOMY, BILATERAL MYRINGOTOMY AND TUBES  1972    SOCIAL HISTORY: Social History   Tobacco Use  . Smoking status: Never Smoker  . Smokeless tobacco: Never Used  Substance Use  Topics  . Alcohol use: No  . Drug use: No    FAMILY HISTORY: Family History  Problem Relation Age of Onset  . Hypertension Mother   . Arthritis Mother   . Diabetes Mother   . Hyperlipidemia Mother   . Heart disease Mother   . Depression Mother   . Anxiety disorder Mother   . Sleep apnea Mother   . Eating disorder Mother   . Obstructive Sleep Apnea Mother   . Obesity Mother   . Cancer Father        lliver, pancreas, stomach  . Liver disease Father   . Chronic fatigue Sister     ROS: Review of Systems  Constitutional: Negative for weight loss.  Respiratory: Negative for shortness of breath.   Cardiovascular: Negative for chest pain.  Gastrointestinal: Negative for nausea and vomiting.  Musculoskeletal:       Negative muscle weakness    PHYSICAL EXAM: Blood pressure (!) 147/90, pulse 62, temperature 97.9 F (36.6 C), temperature source Oral, height 5\' 6"  (1.676 m), weight 238 lb (108 kg), SpO2 100 %. Body mass index is 38.41 kg/m. Physical Exam  Constitutional: She is oriented to person, place, and time. She appears well-developed and well-nourished.  Cardiovascular: Normal rate.  Pulmonary/Chest: Effort normal.  Musculoskeletal: Normal range of motion.  Neurological: She is oriented to person, place, and time.  Skin: Skin is warm and dry.  Psychiatric: She has a normal mood and affect. Her behavior is normal.  Vitals reviewed.   RECENT LABS AND TESTS: BMET    Component Value Date/Time   NA 141 01/08/2018 0818   K 4.9 01/08/2018 0818   CL 105 01/08/2018 0818   CO2 21 01/08/2018 0818   GLUCOSE 77 01/08/2018 0818   GLUCOSE 85 09/26/2012 0414   BUN 14 01/08/2018 0818   CREATININE 0.84 01/08/2018 0818   CALCIUM 9.5 01/08/2018 0818   GFRNONAA 79 01/08/2018 0818   GFRAA 91 01/08/2018 0818   Lab Results  Component Value Date   HGBA1C 5.2 01/08/2018   HGBA1C 5.1 07/14/2017   Lab Results  Component Value Date   INSULIN 5.6 01/08/2018   INSULIN 6.0  07/14/2017   CBC    Component Value Date/Time   WBC 7.2 01/08/2018 0818   WBC 9.7 09/26/2012 0414   RBC 4.90 01/08/2018 0818   RBC 4.19 09/26/2012 0414   HGB 13.5 01/08/2018 0818   HCT 41.8 01/08/2018 0818   PLT 228 09/26/2012 0414   MCV 85 01/08/2018 0818   MCH 27.6 01/08/2018 0818   MCH 29.6  09/26/2012 0414   MCHC 32.3 01/08/2018 0818   MCHC 32.7 09/26/2012 0414   RDW 15.9 (H) 01/08/2018 0818   LYMPHSABS 2.2 01/08/2018 0818   EOSABS 0.1 01/08/2018 0818   BASOSABS 0.1 01/08/2018 0818   Iron/TIBC/Ferritin/ %Sat No results found for: IRON, TIBC, FERRITIN, IRONPCTSAT Lipid Panel     Component Value Date/Time   CHOL 192 01/08/2018 0818   TRIG 79 01/08/2018 0818   HDL 49 01/08/2018 0818   LDLCALC 127 (H) 01/08/2018 0818   Hepatic Function Panel     Component Value Date/Time   PROT 7.0 01/08/2018 0818   ALBUMIN 4.3 01/08/2018 0818   AST 19 01/08/2018 0818   ALT 18 01/08/2018 0818   ALKPHOS 83 01/08/2018 0818   BILITOT 0.2 01/08/2018 0818      Component Value Date/Time   TSH 2.500 01/08/2018 0818   TSH 2.560 07/14/2017 0935  Results for MANAIA, SAMAD (MRN [id number]) as of 03/12/2018 11:33  Ref. Range 01/08/2018 08:18  Vitamin D, 25-Hydroxy Latest Ref Range: 30.0 - 100.0 ng/mL 44.8    ASSESSMENT AND PLAN: Vitamin D deficiency - Plan: Vitamin D, Ergocalciferol, (DRISDOL) 50000 units CAPS capsule  Essential hypertension  At risk for heart disease  Class 2 severe obesity with serious comorbidity and body mass index (BMI) of 38.0 to 38.9 in adult, unspecified obesity type (HCC)  PLAN:  Vitamin D Deficiency Brenisha was informed that low vitamin D levels contributes to fatigue and are associated with obesity, breast, and colon cancer. Susan Dorsey agrees to continue taking prescription Vit D @50 ,000 IU every 14 days #2 and we will refill for 1 month. She will follow up for routine testing of vitamin D, at least 2-3 times per year. She was informed of the risk of over-replacement  of vitamin D and agrees to not increase her dose unless she discusses this with [place] first. Susan Dorsey agrees to follow up with our clinic in 3 weeks.  Hypertension We discussed sodium restriction, working on healthy weight loss, and a regular exercise program as the means to achieve improved blood pressure control. Susan Dorsey agreed with this plan and agreed to follow up as directed. We will continue to monitor her blood pressure as well as her progress with the above lifestyle modifications. She will continue diet and exercise and she will watch for signs of hypotension as she continues her lifestyle modifications. Susan Dorsey agrees to follow up with our clinic in 3 weeks.  Cardiovascular risk counselling Susan Dorsey was given extended (15 minutes) coronary artery disease prevention counseling today. She is 57 y.o. female and has risk factors for heart disease including obesity and hypertension. We discussed intensive lifestyle modifications today with an emphasis on specific weight loss instructions and strategies. Pt was also informed of the importance of increasing exercise and decreasing saturated fats to help prevent heart disease.  Obesity Susan Dorsey is currently in the action stage of change. As such, her goal is to continue with weight loss efforts She has agreed to change to follow the Pescatarian eating plan Susan Dorsey has been instructed to work up to a goal of 150 minutes of combined cardio and strengthening exercise per week for weight loss and overall health benefits. We discussed the following Behavioral Modification Strategies today: increasing lean protein intake and work on meal planning and easy cooking plans   Susan Dorsey has agreed to follow up with our clinic in 3 weeks. She was informed of the importance of frequent follow up visits to maximize her success with intensive lifestyle  modifications for her multiple health conditions.   OBESITY BEHAVIORAL INTERVENTION VISIT  Today's visit was # 9 out of 22.  Starting weight:  248 lbs Starting date: 07/14/17 Today's weight : 238 lbs Today's date: 03/12/2018 Total lbs lost to date: 10 (Patients must lose 7 lbs in the first 6 months to continue with counseling)   ASK: We discussed the diagnosis of obesity with Susan Dorsey today and Susan Dorsey agreed to give [place] permission to discuss obesity behavioral modification therapy today.  ASSESS: Susan Dorsey has the diagnosis of obesity and her BMI today is 38.43 Susan Dorsey is in the action stage of change   ADVISE: Susan Dorsey was educated on the multiple health risks of obesity as well as the benefit of weight loss to improve her health. She was advised of the need for long term treatment and the importance of lifestyle modifications.  AGREE: Multiple dietary modification options and treatment options were discussed and  Susan Dorsey agreed to the above obesity treatment plan.   Trude Mcburney, am acting as transcriptionist for Illa Level, PA-C I, Illa Level [facility], have reviewed this note and agree with its content

## 2018-03-14 ENCOUNTER — Telehealth (INDEPENDENT_AMBULATORY_CARE_PROVIDER_SITE_OTHER): Payer: Self-pay | Admitting: Physician Assistant

## 2018-03-14 ENCOUNTER — Encounter (INDEPENDENT_AMBULATORY_CARE_PROVIDER_SITE_OTHER): Payer: Self-pay

## 2018-03-14 NOTE — Telephone Encounter (Signed)
Vitamin d not called in.  Pt is wondering why.   [facility] [address] / [address]

## 2018-03-14 NOTE — Telephone Encounter (Signed)
Sent the pt a mychart message. Tyrek Lawhorn, CMA

## 2018-04-02 ENCOUNTER — Ambulatory Visit (INDEPENDENT_AMBULATORY_CARE_PROVIDER_SITE_OTHER): Payer: 59 | Admitting: Physician Assistant

## 2018-04-02 VITALS — BP 123/78 | HR 66 | Temp 98.1°F | Ht 66.0 in | Wt 234.0 lb

## 2018-04-02 DIAGNOSIS — E559 Vitamin D deficiency, unspecified: Secondary | ICD-10-CM

## 2018-04-02 DIAGNOSIS — I10 Essential (primary) hypertension: Secondary | ICD-10-CM | POA: Diagnosis not present

## 2018-04-02 DIAGNOSIS — Z6837 Body mass index (BMI) 37.0-37.9, adult: Secondary | ICD-10-CM | POA: Diagnosis not present

## 2018-04-02 DIAGNOSIS — Z9189 Other specified personal risk factors, not elsewhere classified: Secondary | ICD-10-CM

## 2018-04-02 MED ORDER — VITAMIN D (ERGOCALCIFEROL) 1.25 MG (50000 UNIT) PO CAPS: ORAL_CAPSULE | ORAL | 0 refills | Status: DC

## 2018-04-02 NOTE — Progress Notes (Signed)
Office: [phone]  /  Fax: [phone]   HPI:   Chief Complaint: OBESITY Susan Dorsey is here to discuss her progress with her obesity treatment plan. She is on the keep a food journal with 1500 calories and 80 grams of protein daily and is following her eating plan approximately 75-80 % of the time. She states she is exercising 0 minutes 0 times per week. Susan Dorsey continues to do well with weight loss. She feels better after recent illness and is back to keeping a strict food journal. She states she exceeds her calorie goals on the weekends.  Her weight is 234 lb (106.1 kg) today and has had a weight loss of 4 pounds over a period of 3 weeks since her last visit. She has lost 14 lbs since starting treatment with us.  Vitamin D Deficiency Susan Dorsey has a diagnosis of vitamin D deficiency. She is currently taking prescription Vit D and denies nausea, vomiting or muscle weakness.  At risk for osteopenia and osteoporosis Susan Dorsey is at higher risk of osteopenia and osteoporosis due to vitamin D deficiency.    Hypertension Susan Jarome MatinJ Dorsey is a 57 y.o. female with hypertension. [name]'s blood pressure is stable and she denies chest pain or shortness of breath. She is back to normal and states blood pressure at home is stable. She is not on medications. She is working weight loss to help control her blood pressure with the goal of decreasing her risk of heart attack and stroke. Susan Dorsey's blood pressure is currently controlled.  ALLERGIES: Allergies  Allergen Reactions  . Aspirin Swelling    Lips only.  . Chocolate   . Gluten Meal     MEDICATIONS: Current Outpatient Medications on File Prior to Visit  Medication Sig Dispense Refill  . Biotin 1000 MCG CHEW Chew by mouth daily.    . Black Elderberry,Berry-Flower, 575 MG CAPS Take 1 capsule by mouth daily.    . cetirizine (ZYRTEC) 10 MG tablet Take 10 mg by mouth daily.     . DHA-EPA-Vitamin E (OMEGA-3 COMPLEX PO) Take by mouth daily.    . diphenhydrAMINE (BENADRYL)  25 MG tablet Take 25 mg by mouth at bedtime as needed.    Marland Kitchen. FIBER ADULT GUMMIES 2 g CHEW Chew 2 tablets by mouth 2 (two) times daily.    . fluticasone (FLONASE) 50 MCG/ACT nasal spray Place 2 sprays into the nose daily.     . LOW-OGESTREL 0.3-30 MG-MCG tablet Take 1 tablet by mouth daily.     . Magnesium 250 MG TABS Take 1 tablet by mouth daily.    . Melatonin 5 MG CAPS Take by mouth daily.    . Multiple Vitamin (MULTIVITAMIN WITH MINERALS) TABS Take 1 tablet by mouth daily.    . naproxen sodium (ALEVE) 220 MG tablet Take 220 mg by mouth 2 (two) times daily as needed.    Marland Kitchen. omeprazole (PRILOSEC) 40 MG capsule Take 40 mg by mouth 2 (two) times daily.     . Plant Sterols and Stanols (CHOLEST OFF PO) Take 1 tablet 2 (two) times daily by mouth.    Marland Kitchen. VITAMIN B COMPLEX-C CAPS Take 1 capsule by mouth daily.     No current facility-administered medications on file prior to visit.     PAST MEDICAL HISTORY: Past Medical History:  Diagnosis Date  . Allergy   . Anemia   . B12 deficiency   . Chronic kidney disease    hx of uti   . Gallbladder problem   .  GERD (gastroesophageal reflux disease)   . Gluten intolerance   . Gluten intolerance   . Hyperlipidemia    borderline  . Hypertension    Resolved after LAGB  . Joint pain   . Keratoconus   . Morbid obesity (HCC)   . Obesity   . Peeling of nails   . Swallowing difficulty   . Swelling    feet and legs  . TMJ (dislocation of temporomandibular joint)   . Vitamin D deficiency   . Wears glasses   . White coat syndrome with diagnosis of hypertension     PAST SURGICAL HISTORY: Past Surgical History:  Procedure Laterality Date  . CHOLECYSTECTOMY  1990's   Patient unsure of date  . LAPAROSCOPIC GASTRIC BANDING  01/23/2007  . PANNICULECTOMY  09/24/2012   Procedure: PANNICULECTOMY;  Surgeon: Valarie Merino, MD;  Location: [facility];  Service: General;  Laterality: N/A;  . TONSILECTOMY, ADENOIDECTOMY, BILATERAL MYRINGOTOMY AND TUBES  1972     SOCIAL HISTORY: Social History   Tobacco Use  . Smoking status: Never Smoker  . Smokeless tobacco: Never Used  Substance Use Topics  . Alcohol use: No  . Drug use: No    FAMILY HISTORY: Family History  Problem Relation Age of Onset  . Hypertension Mother   . Arthritis Mother   . Diabetes Mother   . Hyperlipidemia Mother   . Heart disease Mother   . Depression Mother   . Anxiety disorder Mother   . Sleep apnea Mother   . Eating disorder Mother   . Obstructive Sleep Apnea Mother   . Obesity Mother   . Cancer Father        lliver, pancreas, stomach  . Liver disease Father   . Chronic fatigue Sister     ROS: Review of Systems  Constitutional: Positive for weight loss.  Respiratory: Negative for shortness of breath.   Cardiovascular: Negative for chest pain.  Gastrointestinal: Negative for nausea and vomiting.  Musculoskeletal:       Negative muscle weakness    PHYSICAL EXAM: Blood pressure 123/78, pulse 66, temperature 98.1 F (36.7 C), temperature source Oral, height 5\' 6"  (1.676 m), weight 234 lb (106.1 kg), SpO2 98 %. Body mass index is 37.77 kg/m. Physical Exam  Constitutional: She is oriented to person, place, and time. She appears well-developed and well-nourished.  Cardiovascular: Normal rate.  Pulmonary/Chest: Effort normal.  Musculoskeletal: Normal range of motion.  Neurological: She is oriented to person, place, and time.  Skin: Skin is warm and dry.  Psychiatric: She has a normal mood and affect. Her behavior is normal.  Vitals reviewed.   RECENT LABS AND TESTS: BMET    Component Value Date/Time   NA 141 01/08/2018 0818   K 4.9 01/08/2018 0818   CL 105 01/08/2018 0818   CO2 21 01/08/2018 0818   GLUCOSE 77 01/08/2018 0818   GLUCOSE 85 09/26/2012 0414   BUN 14 01/08/2018 0818   CREATININE 0.84 01/08/2018 0818   CALCIUM 9.5 01/08/2018 0818   GFRNONAA 79 01/08/2018 0818   GFRAA 91 01/08/2018 0818   Lab Results  Component Value Date    HGBA1C 5.2 01/08/2018   HGBA1C 5.1 07/14/2017   Lab Results  Component Value Date   INSULIN 5.6 01/08/2018   INSULIN 6.0 07/14/2017   CBC    Component Value Date/Time   WBC 7.2 01/08/2018 0818   WBC 9.7 09/26/2012 0414   RBC 4.90 01/08/2018 0818   RBC 4.19 09/26/2012 0414  HGB 13.5 01/08/2018 0818   HCT 41.8 01/08/2018 0818   PLT 228 09/26/2012 0414   MCV 85 01/08/2018 0818   MCH 27.6 01/08/2018 0818   MCH 29.6 09/26/2012 0414   MCHC 32.3 01/08/2018 0818   MCHC 32.7 09/26/2012 0414   RDW 15.9 (H) 01/08/2018 0818   LYMPHSABS 2.2 01/08/2018 0818   EOSABS 0.1 01/08/2018 0818   BASOSABS 0.1 01/08/2018 0818   Iron/TIBC/Ferritin/ %Sat No results found for: IRON, TIBC, FERRITIN, IRONPCTSAT Lipid Panel     Component Value Date/Time   CHOL 192 01/08/2018 0818   TRIG 79 01/08/2018 0818   HDL 49 01/08/2018 0818   LDLCALC 127 (H) 01/08/2018 0818   Hepatic Function Panel     Component Value Date/Time   PROT 7.0 01/08/2018 0818   ALBUMIN 4.3 01/08/2018 0818   AST 19 01/08/2018 0818   ALT 18 01/08/2018 0818   ALKPHOS 83 01/08/2018 0818   BILITOT 0.2 01/08/2018 0818      Component Value Date/Time   TSH 2.500 01/08/2018 0818   TSH 2.560 07/14/2017 0935  Results for Miara, Emminger Tametra J (MRN [id number]) as of 04/02/2018 09:29  Ref. Range 01/08/2018 08:18  Vitamin D, 25-Hydroxy Latest Ref Range: 30.0 - 100.0 ng/mL 44.8    ASSESSMENT AND PLAN: Vitamin D deficiency - Plan: Vitamin D, Ergocalciferol, (DRISDOL) 50000 units CAPS capsule  Essential hypertension  At risk for osteoporosis  Class 2 severe obesity with serious comorbidity and body mass index (BMI) of 37.0 to 37.9 in adult, unspecified obesity type (HCC)  PLAN:  Vitamin D Deficiency Bevin was informed that low vitamin D levels contributes to fatigue and are associated with obesity, breast, and colon cancer. Tamar agrees to continue taking prescription Vit D @50 ,000 IU every week #4 and we will refill for 1 month. She will  follow up for routine testing of vitamin D, at least 2-3 times per year. She was informed of the risk of over-replacement of vitamin D and agrees to not increase her dose unless she discusses this with [place] first. Janitza agrees to follow up with our clinic in 4 weeks.  At risk for osteopenia and osteoporosis Mccartney is at risk for osteopenia and osteoporsis due to her vitamin D deficiency. She was encouraged to take her vitamin D and follow her higher calcium diet and increase strengthening exercise to help strengthen her bones and decrease her risk of osteopenia and osteoporosis.  Hypertension We discussed sodium restriction, working on healthy weight loss, and a regular exercise program as the means to achieve improved blood pressure control. Gissel agreed with this plan and agreed to follow up as directed. We will continue to monitor her blood pressure as well as her progress with the above lifestyle modifications. She will continue diet, exercise, and will watch for signs of hypotension as she continues her lifestyle modifications. Clary agrees to follow up with our clinic in 4 weeks.  Obesity Younique is currently in the action stage of change. As such, her goal is to continue with weight loss efforts She has agreed to keep a food journal with 1500 calories and 80 grams of protein daily Countess has been instructed to work up to a goal of 150 minutes of combined cardio and strengthening exercise per week for weight loss and overall health benefits. We discussed the following Behavioral Modification Strategies today: increasing lean protein intake and planning for success   Skarlet has agreed to follow up with our clinic in 4 weeks. She was informed of  the importance of frequent follow up visits to maximize her success with intensive lifestyle modifications for her multiple health conditions.   OBESITY BEHAVIORAL INTERVENTION VISIT  Today's visit was # 10 out of 22.  Starting weight: 248 lbs Starting date:  07/14/17 Today's weight : 234 lbs  Today's date: 04/02/2018 Total lbs lost to date: 14 (Patients must lose 7 lbs in the first 6 months to continue with counseling)   ASK: We discussed the diagnosis of obesity with Malai Jarome Matin today and Yara agreed to give [place] permission to discuss obesity behavioral modification therapy today.  ASSESS: Yeraldine has the diagnosis of obesity and her BMI today is 37.79 Megham is in the action stage of change   ADVISE: Mana was educated on the multiple health risks of obesity as well as the benefit of weight loss to improve her health. She was advised of the need for long term treatment and the importance of lifestyle modifications.  AGREE: Multiple dietary modification options and treatment options were discussed and  Kirandeep agreed to the above obesity treatment plan.   Trude Mcburney, am acting as transcriptionist for Illa Level, PA-C I, Illa Level [facility], have reviewed this note and agree with its content

## 2018-05-01 ENCOUNTER — Ambulatory Visit (INDEPENDENT_AMBULATORY_CARE_PROVIDER_SITE_OTHER): Payer: 59 | Admitting: Physician Assistant

## 2018-05-01 VITALS — BP 139/85 | HR 63 | Temp 98.2°F | Ht 66.0 in | Wt 237.0 lb

## 2018-05-01 DIAGNOSIS — I10 Essential (primary) hypertension: Secondary | ICD-10-CM

## 2018-05-01 DIAGNOSIS — Z6838 Body mass index (BMI) 38.0-38.9, adult: Secondary | ICD-10-CM | POA: Diagnosis not present

## 2018-05-01 DIAGNOSIS — Z9189 Other specified personal risk factors, not elsewhere classified: Secondary | ICD-10-CM

## 2018-05-01 DIAGNOSIS — E559 Vitamin D deficiency, unspecified: Secondary | ICD-10-CM | POA: Diagnosis not present

## 2018-05-01 MED ORDER — VITAMIN D (ERGOCALCIFEROL) 1.25 MG (50000 UNIT) PO CAPS: ORAL_CAPSULE | ORAL | 0 refills | Status: DC

## 2018-05-01 NOTE — Progress Notes (Signed)
Office: [phone]  /  Fax: [phone]   HPI:   Chief Complaint: OBESITY Susan Dorsey is here to discuss her progress with her obesity treatment plan. She is on the keep a food journal with 1500 calories and 80 grams of protein daily and is following her eating plan approximately 75 % of the time. She states she is exercising 0 minutes 0 times per week. Susan Dorsey has been out of town and has not been mindful of her eating. She is motivated to get back on track and continue with weight loss. Her weight is 237 lb (107.5 kg) today and has had a weight gain of 3 pounds over a period of 4 weeks since her last visit. She has lost 11 lbs since starting treatment with [place].  Vitamin D deficiency Susan Dorsey has a diagnosis of vitamin D deficiency. She is currently taking vit D and denies nausea, vomiting or muscle weakness.  At risk for osteopenia and osteoporosis Susan Dorsey is at higher risk of osteopenia and osteoporosis due to vitamin D deficiency.   Hypertension Susan Dorsey is a 57 y.o. female with hypertension. Susan Dorsey denies chest pain or shortness of breath on exertion. She is working weight loss to help control her blood pressure with the goal of decreasing her risk of heart attack and stroke. Susan Dorsey blood pressure is stable.  ALLERGIES: Allergies  Allergen Reactions  . Aspirin Swelling    Lips only.  . Chocolate   . Gluten Meal     MEDICATIONS: Current Outpatient Medications on File Prior to Visit  Medication Sig Dispense Refill  . Biotin 1000 MCG CHEW Chew by mouth daily.    . Black Elderberry,Berry-Flower, 575 MG CAPS Take 1 capsule by mouth daily.    . cetirizine (ZYRTEC) 10 MG tablet Take 10 mg by mouth daily.     . DHA-EPA-Vitamin E (OMEGA-3 COMPLEX PO) Take by mouth daily.    . diphenhydrAMINE (BENADRYL) 25 MG tablet Take 25 mg by mouth at bedtime as needed.    Marland Kitchen FIBER ADULT GUMMIES 2 g CHEW Chew 2 tablets by mouth 2 (two) times daily.    . fluticasone (FLONASE) 50 MCG/ACT nasal spray  Place 2 sprays into the nose daily.     . LOW-OGESTREL 0.3-30 MG-MCG tablet Take 1 tablet by mouth daily.     . Magnesium 250 MG TABS Take 1 tablet by mouth daily.    . Melatonin 5 MG CAPS Take by mouth daily.    . Multiple Vitamin (MULTIVITAMIN WITH MINERALS) TABS Take 1 tablet by mouth daily.    . naproxen sodium (ALEVE) 220 MG tablet Take 220 mg by mouth 2 (two) times daily as needed.    Marland Kitchen omeprazole (PRILOSEC) 40 MG capsule Take 40 mg by mouth 2 (two) times daily.     . Plant Sterols and Stanols (CHOLEST OFF PO) Take 1 tablet 2 (two) times daily by mouth.    Marland Kitchen VITAMIN B COMPLEX-C CAPS Take 1 capsule by mouth daily.     No current facility-administered medications on file prior to visit.     PAST MEDICAL HISTORY: Past Medical History:  Diagnosis Date  . Allergy   . Anemia   . B12 deficiency   . Chronic kidney disease    hx of uti   . Gallbladder problem   . GERD (gastroesophageal reflux disease)   . Gluten intolerance   . Gluten intolerance   . Hyperlipidemia    borderline  . Hypertension    Resolved  after LAGB  . Joint pain   . Keratoconus   . Morbid obesity (HCC)   . Obesity   . Peeling of nails   . Swallowing difficulty   . Swelling    feet and legs  . TMJ (dislocation of temporomandibular joint)   . Vitamin D deficiency   . Wears glasses   . White coat syndrome with diagnosis of hypertension     PAST SURGICAL HISTORY: Past Surgical History:  Procedure Laterality Date  . CHOLECYSTECTOMY  1990's   Patient unsure of date  . LAPAROSCOPIC GASTRIC BANDING  01/23/2007  . PANNICULECTOMY  09/24/2012   Procedure: PANNICULECTOMY;  Surgeon: Valarie Merino, MD;  Location: [facility];  Service: General;  Laterality: N/A;  . TONSILECTOMY, ADENOIDECTOMY, BILATERAL MYRINGOTOMY AND TUBES  1972    SOCIAL HISTORY: Social History   Tobacco Use  . Smoking status: Never Smoker  . Smokeless tobacco: Never Used  Substance Use Topics  . Alcohol use: No  . Drug use: No     FAMILY HISTORY: Family History  Problem Relation Age of Onset  . Hypertension Mother   . Arthritis Mother   . Diabetes Mother   . Hyperlipidemia Mother   . Heart disease Mother   . Depression Mother   . Anxiety disorder Mother   . Sleep apnea Mother   . Eating disorder Mother   . Obstructive Sleep Apnea Mother   . Obesity Mother   . Cancer Father        lliver, pancreas, stomach  . Liver disease Father   . Chronic fatigue Sister     ROS: Review of Systems  Constitutional: Negative for weight loss.  Respiratory: Negative for shortness of breath (on exertion).   Cardiovascular: Negative for chest pain.  Gastrointestinal: Negative for nausea and vomiting.  Musculoskeletal:       Negative for muscle weakness    PHYSICAL EXAM: Blood pressure 139/85, pulse 63, temperature 98.2 F (36.8 C), temperature source Oral, height  (1.676 m), weight 237 lb (107.5 kg), SpO2 100 %. Body mass index is 38.25 kg/m. Physical Exam  Constitutional: She is oriented to person, place, and time. She appears well-developed and well-nourished.  Cardiovascular: Normal rate.  Pulmonary/Chest: Effort normal.  Musculoskeletal: Normal range of motion.  Neurological: She is oriented to person, place, and time.  Skin: Skin is warm and dry.  Psychiatric: She has a normal mood and affect. Her behavior is normal.  Vitals reviewed.   RECENT LABS AND TESTS: BMET    Component Value Date/Time   NA 141 01/08/2018 0818   K 4.9 01/08/2018 0818   CL 105 01/08/2018 0818   CO2 21 01/08/2018 0818   GLUCOSE 77 01/08/2018 0818   GLUCOSE 85 09/26/2012 0414   BUN 14 01/08/2018 0818   CREATININE 0.84 01/08/2018 0818   CALCIUM 9.5 01/08/2018 0818   GFRNONAA 79 01/08/2018 0818   GFRAA 91 01/08/2018 0818   Lab Results  Component Value Date   HGBA1C 5.2 01/08/2018   HGBA1C 5.1 07/14/2017   Lab Results  Component Value Date   INSULIN 5.6 01/08/2018   INSULIN 6.0 07/14/2017   CBC    Component  Value Date/Time   WBC 7.2 01/08/2018 0818   WBC 9.7 09/26/2012 0414   RBC 4.90 01/08/2018 0818   RBC 4.19 09/26/2012 0414   HGB 13.5 01/08/2018 0818   HCT 41.8 01/08/2018 0818   PLT 228 09/26/2012 0414   MCV 85 01/08/2018 0818   MCH 27.6  01/08/2018 0818   MCH 29.6 09/26/2012 0414   MCHC 32.3 01/08/2018 0818   MCHC 32.7 09/26/2012 0414   RDW 15.9 (H) 01/08/2018 0818   LYMPHSABS 2.2 01/08/2018 0818   EOSABS 0.1 01/08/2018 0818   BASOSABS 0.1 01/08/2018 0818   Iron/TIBC/Ferritin/ %Sat No results found for: IRON, TIBC, FERRITIN, IRONPCTSAT Lipid Panel     Component Value Date/Time   CHOL 192 01/08/2018 0818   TRIG 79 01/08/2018 0818   HDL 49 01/08/2018 0818   LDLCALC 127 (H) 01/08/2018 0818   Hepatic Function Panel     Component Value Date/Time   PROT 7.0 01/08/2018 0818   ALBUMIN 4.3 01/08/2018 0818   AST 19 01/08/2018 0818   ALT 18 01/08/2018 0818   ALKPHOS 83 01/08/2018 0818   BILITOT 0.2 01/08/2018 0818      Component Value Date/Time   TSH 2.500 01/08/2018 0818   TSH 2.560 07/14/2017 0935   Results for Susan Dorsey, Susan Dorsey (MRN [id number]) as of 05/01/2018 15:32  Ref. Range 01/08/2018 08:18  Vitamin D, 25-Hydroxy Latest Ref Range: 30.0 - 100.0 ng/mL 44.8   ASSESSMENT AND PLAN: Vitamin D deficiency - Plan: Vitamin D, Ergocalciferol, (DRISDOL) 50000 units CAPS capsule  Essential hypertension  At risk for osteoporosis  Class 2 severe obesity with serious comorbidity and body mass index (BMI) of 38.0 to 38.9 in adult, unspecified obesity type (HCC)  PLAN:  Vitamin D Deficiency Susan Dorsey was informed that low vitamin D levels contributes to fatigue and are associated with obesity, breast, and colon cancer. She agrees to continue to take prescription Vit D ,000 IU every week #4 with no refills and will follow up for routine testing of vitamin D, at least 2-3 times per year. She was informed of the risk of over-replacement of vitamin D and agrees to not increase her dose  unless she discusses this with [place] first. Susan Dorsey agrees to follow up with our clinic in 2 weeks.  At risk for osteopenia and osteoporosis Susan Dorsey is at risk for osteopenia and osteoporosis due to her vitamin D deficiency. She was encouraged to take her vitamin D and follow her higher calcium diet and increase strengthening exercise to help strengthen her bones and decrease her risk of osteopenia and osteoporosis.  Hypertension We discussed sodium restriction, working on healthy weight loss, and a regular exercise program as the means to achieve improved blood pressure control. Susan Dorsey agreed with this plan and agreed to follow up as directed. We will continue to monitor her blood pressure as well as her progress with the above lifestyle modifications. She will continue her medications as prescribed and will watch for signs of hypotension as she continues her lifestyle modifications.  Obesity Susan Dorsey is currently in the action stage of change. As such, her goal is to continue with weight loss efforts She has agreed to keep a food journal with 1500 calories and 80 grams of protein daily Susan Dorsey has been instructed to work up to a goal of 150 minutes of combined cardio and strengthening exercise per week for weight loss and overall health benefits. We discussed the following Behavioral Modification Strategies today: keeping healthy foods in the home, keep a strict food journal, increasing lean protein intake and decrease junk food  Kaede has agreed to follow up with our clinic in 2 weeks. She was informed of the importance of frequent follow up visits to maximize her success with intensive lifestyle modifications for her multiple health conditions.   OBESITY BEHAVIORAL INTERVENTION VISIT  Today's  visit was # 11 out of 22.  Starting weight: 248 lbs Starting date: 07/14/17 Today's weight : 237 lbs Today's date: 05/01/2018 Total lbs lost to date: 11 (Patients must lose 7 lbs in the first 6 months to continue with  counseling)   ASK: We discussed the diagnosis of obesity with Talajah Jarome Matin today and Natosha agreed to give [place] permission to discuss obesity behavioral modification therapy today.  ASSESS: Muranda has the diagnosis of obesity and her BMI today is 38.27 Forrest is in the action stage of change   ADVISE: Adalin was educated on the multiple health risks of obesity as well as the benefit of weight loss to improve her health. She was advised of the need for long term treatment and the importance of lifestyle modifications.  AGREE: Multiple dietary modification options and treatment options were discussed and  Dalary agreed to the above obesity treatment plan.   Cristi Loron, am acting as transcriptionist for [employer], PA-C I, [employer] [facility], have reviewed this note and agree with its content

## 2018-05-17 ENCOUNTER — Ambulatory Visit (INDEPENDENT_AMBULATORY_CARE_PROVIDER_SITE_OTHER): Payer: 59 | Admitting: Physician Assistant

## 2018-05-17 VITALS — BP 126/80 | HR 60 | Temp 98.1°F | Ht 66.0 in | Wt 235.0 lb

## 2018-05-17 DIAGNOSIS — Z6838 Body mass index (BMI) 38.0-38.9, adult: Secondary | ICD-10-CM

## 2018-05-17 DIAGNOSIS — Z9189 Other specified personal risk factors, not elsewhere classified: Secondary | ICD-10-CM | POA: Diagnosis not present

## 2018-05-17 DIAGNOSIS — I10 Essential (primary) hypertension: Secondary | ICD-10-CM | POA: Diagnosis not present

## 2018-05-17 DIAGNOSIS — E559 Vitamin D deficiency, unspecified: Secondary | ICD-10-CM | POA: Diagnosis not present

## 2018-05-17 MED ORDER — VITAMIN D (ERGOCALCIFEROL) 1.25 MG (50000 UNIT) PO CAPS: ORAL_CAPSULE | ORAL | 0 refills | Status: DC

## 2018-05-17 NOTE — Progress Notes (Signed)
Office: [phone]  /  Fax: [phone]   HPI:   Chief Complaint: OBESITY Susan Dorsey is here to discuss her progress with her obesity treatment plan. She is on the keep a food journal with 1500 calories and 80 grams of protein daily and is following her eating plan approximately 75 % of the time. She states she is exercising 0 minutes 0 times per week. Susan Dorsey continues to do well with weight loss. She has been more mindful of her eating over the past week. She would like some meal planning ideas. Her weight is 235 lb (106.6 kg) today and has had a weight loss of 2 pounds over a period of 2 weeks since her last visit. She has lost 13 lbs since starting treatment with [place].  Vitamin D deficiency Susan Dorsey has a diagnosis of vitamin D deficiency. She is currently taking vit D and denies nausea, vomiting or muscle weakness.  At risk for osteopenia and osteoporosis Susan Dorsey is at higher risk of osteopenia and osteoporosis due to vitamin D deficiency.   Hypertension Susan Dorsey Susan Dorsey Susan Dorsey is a 57 y.o. female with hypertension.  Susan Dorsey Susan Dorsey Susan Dorsey denies chest pain or shortness of breath on exertion. She is working weight loss to help control her blood pressure with the goal of decreasing her risk of heart attack and stroke. Amys blood pressure is stable.  ALLERGIES: Allergies  Allergen Reactions  . Aspirin Swelling    Lips only.  . Chocolate   . Gluten Meal     MEDICATIONS: Current Outpatient Medications on File Prior to Visit  Medication Sig Dispense Refill  . Biotin 1000 MCG CHEW Chew by mouth daily.    . Black Elderberry,Berry-Flower, 575 MG CAPS Take 1 capsule by mouth daily.    . cetirizine (ZYRTEC) 10 MG tablet Take 10 mg by mouth daily.     . DHA-EPA-Vitamin E (OMEGA-3 COMPLEX PO) Take by mouth daily.    . diphenhydrAMINE (BENADRYL) 25 MG tablet Take 25 mg by mouth at bedtime as needed.    Susan Dorsey Kitchen FIBER ADULT GUMMIES 2 g CHEW Chew 2 tablets by mouth 2 (two) times daily.    . fluticasone (FLONASE) 50 MCG/ACT  nasal spray Place 2 sprays into the nose daily.     . LOW-OGESTREL 0.3-30 MG-MCG tablet Take 1 tablet by mouth daily.     . Magnesium 250 MG TABS Take 1 tablet by mouth daily.    . Melatonin 5 MG CAPS Take by mouth daily.    . Multiple Vitamin (MULTIVITAMIN WITH MINERALS) TABS Take 1 tablet by mouth daily.    . naproxen sodium (ALEVE) 220 MG tablet Take 220 mg by mouth 2 (two) times daily as needed.    Susan Dorsey Kitchen omeprazole (PRILOSEC) 40 MG capsule Take 40 mg by mouth 2 (two) times daily.     . Plant Sterols and Stanols (CHOLEST OFF PO) Take 1 tablet 2 (two) times daily by mouth.    Susan Dorsey Kitchen VITAMIN B COMPLEX-C CAPS Take 1 capsule by mouth daily.     No current facility-administered medications on file prior to visit.     PAST MEDICAL HISTORY: Past Medical History:  Diagnosis Date  . Allergy   . Anemia   . B12 deficiency   . Chronic kidney disease    hx of uti   . Gallbladder problem   . GERD (gastroesophageal reflux disease)   . Gluten intolerance   . Gluten intolerance   . Hyperlipidemia    borderline  . Hypertension  Resolved after LAGB  . Joint pain   . Keratoconus   . Morbid obesity (HCC)   . Obesity   . Peeling of nails   . Swallowing difficulty   . Swelling    feet and legs  . TMJ (dislocation of temporomandibular joint)   . Vitamin D deficiency   . Wears glasses   . White coat syndrome with diagnosis of hypertension     PAST SURGICAL HISTORY: Past Surgical History:  Procedure Laterality Date  . CHOLECYSTECTOMY  1990's   Patient unsure of date  . LAPAROSCOPIC GASTRIC BANDING  01/23/2007  . PANNICULECTOMY  09/24/2012   Procedure: PANNICULECTOMY;  Surgeon: Valarie Merino, MD;  Location: [facility];  Service: General;  Laterality: N/A;  . TONSILECTOMY, ADENOIDECTOMY, BILATERAL MYRINGOTOMY AND TUBES  1972    SOCIAL HISTORY: Social History   Tobacco Use  . Smoking status: Never Smoker  . Smokeless tobacco: Never Used  Substance Use Topics  . Alcohol use: No  . Drug use:  No    FAMILY HISTORY: Family History  Problem Relation Age of Onset  . Hypertension Mother   . Arthritis Mother   . Diabetes Mother   . Hyperlipidemia Mother   . Heart disease Mother   . Depression Mother   . Anxiety disorder Mother   . Sleep apnea Mother   . Eating disorder Mother   . Obstructive Sleep Apnea Mother   . Obesity Mother   . Cancer Father        lliver, pancreas, stomach  . Liver disease Father   . Chronic fatigue Sister     ROS: Review of Systems  Constitutional: Positive for weight loss.  Respiratory: Negative for shortness of breath (on exertion).   Cardiovascular: Negative for chest pain.  Gastrointestinal: Negative for nausea and vomiting.  Musculoskeletal:       Negative for muscle weakness    PHYSICAL EXAM: Blood pressure 126/80, pulse 60, temperature 98.1 F (36.7 C), temperature source Oral, height  (1.676 m), weight 235 lb (106.6 kg), SpO2 100 %. Body mass index is 37.93 kg/m. Physical Exam  Constitutional: She is oriented to person, place, and time. She appears well-developed and well-nourished.  Cardiovascular: Normal rate.  Pulmonary/Chest: Effort normal.  Musculoskeletal: Normal range of motion.  Neurological: She is oriented to person, place, and time.  Skin: Skin is warm and dry.  Psychiatric: She has a normal mood and affect. Her behavior is normal.  Vitals reviewed.   RECENT LABS AND TESTS: BMET    Component Value Date/Time   NA 141 01/08/2018 0818   K 4.9 01/08/2018 0818   CL 105 01/08/2018 0818   CO2 21 01/08/2018 0818   GLUCOSE 77 01/08/2018 0818   GLUCOSE 85 09/26/2012 0414   BUN 14 01/08/2018 0818   CREATININE 0.84 01/08/2018 0818   CALCIUM 9.5 01/08/2018 0818   GFRNONAA 79 01/08/2018 0818   GFRAA 91 01/08/2018 0818   Lab Results  Component Value Date   HGBA1C 5.2 01/08/2018   HGBA1C 5.1 07/14/2017   Lab Results  Component Value Date   INSULIN 5.6 01/08/2018   INSULIN 6.0 07/14/2017   CBC      Component Value Date/Time   WBC 7.2 01/08/2018 0818   WBC 9.7 09/26/2012 0414   RBC 4.90 01/08/2018 0818   RBC 4.19 09/26/2012 0414   HGB 13.5 01/08/2018 0818   HCT 41.8 01/08/2018 0818   PLT 228 09/26/2012 0414   MCV 85 01/08/2018 0818  MCH 27.6 01/08/2018 0818   MCH 29.6 09/26/2012 0414   MCHC 32.3 01/08/2018 0818   MCHC 32.7 09/26/2012 0414   RDW 15.9 (H) 01/08/2018 0818   LYMPHSABS 2.2 01/08/2018 0818   EOSABS 0.1 01/08/2018 0818   BASOSABS 0.1 01/08/2018 0818   Iron/TIBC/Ferritin/ %Sat No results found for: IRON, TIBC, FERRITIN, IRONPCTSAT Lipid Panel     Component Value Date/Time   CHOL 192 01/08/2018 0818   TRIG 79 01/08/2018 0818   HDL 49 01/08/2018 0818   LDLCALC 127 (H) 01/08/2018 0818   Hepatic Function Panel     Component Value Date/Time   PROT 7.0 01/08/2018 0818   ALBUMIN 4.3 01/08/2018 0818   AST 19 01/08/2018 0818   ALT 18 01/08/2018 0818   ALKPHOS 83 01/08/2018 0818   BILITOT 0.2 01/08/2018 0818      Component Value Date/Time   TSH 2.500 01/08/2018 0818   TSH 2.560 07/14/2017 0935   Results for Susan Dorsey, Susan Dorsey Novice J (MRN [id number]) as of 05/17/2018 10:46  Ref. Range 01/08/2018 08:18  Vitamin D, 25-Hydroxy Latest Ref Range: 30.0 - 100.0 ng/mL 44.8   ASSESSMENT AND PLAN: Vitamin D deficiency - Plan: Vitamin D, Ergocalciferol, (DRISDOL) 50000 units CAPS capsule  Essential hypertension  At risk for osteoporosis  Class 2 severe obesity with serious comorbidity and body mass index (BMI) of 38.0 to 38.9 in adult, unspecified obesity type (HCC)  PLAN:  Vitamin D Deficiency Susan Dorsey was informed that low vitamin D levels contributes to fatigue and are associated with obesity, breast, and colon cancer. She agrees to continue to take prescription Vit D ,000 IU every week #4 with no refills and will follow up for routine testing of vitamin D, at least 2-3 times per year. She was informed of the risk of over-replacement of vitamin D and agrees to not increase  her dose unless she discusses this with [place] first. Susan Dorsey agrees to follow up as directed.  At risk for osteopenia and osteoporosis Susan Dorsey is at risk for osteopenia and osteoporosis due to her vitamin D deficiency. She was encouraged to take her vitamin D and follow her higher calcium diet and increase strengthening exercise to help strengthen her bones and decrease her risk of osteopenia and osteoporosis.  Hypertension We discussed sodium restriction, working on healthy weight loss, and a regular exercise program as the means to achieve improved blood pressure control. Susan Dorsey agreed with this plan and agreed to follow up as directed. We will continue to monitor her blood pressure as well as her progress with the above lifestyle modifications. She will continue her medications as prescribed and will watch for signs of hypotension as she continues her lifestyle modifications.  Obesity Susan Dorsey is currently in the action stage of change. As such, her goal is to continue with weight loss efforts She has agreed to follow a lower carbohydrate, vegetable and lean protein rich diet plan Susan Dorsey has been instructed to work up to a goal of 150 minutes of combined cardio and strengthening exercise per week for weight loss and overall health benefits. We discussed the following Behavioral Modification Strategies today: increasing lean protein intake and decreasing simple carbohydrates   Susan Dorsey has agreed to follow up with our clinic in 3 weeks. She was informed of the importance of frequent follow up visits to maximize her success with intensive lifestyle modifications for her multiple health conditions.   OBESITY BEHAVIORAL INTERVENTION VISIT  Today's visit was # 12 out of 22.  Starting weight: 248 lbs Starting date:  07/14/17 Today's weight : 235 lbs Today's date: 05/17/2018 Total lbs lost to date: 13 (Patients must lose 7 lbs in the first 6 months to continue with counseling)   ASK: We discussed the diagnosis of  obesity with Susan Dorsey Susan Dorsey Matin today and Noah agreed to give [place] permission to discuss obesity behavioral modification therapy today.  ASSESS: Susan Dorsey has the diagnosis of obesity and her BMI today is 37.95 Susan Dorsey is in the action stage of change   ADVISE: Susan Dorsey was educated on the multiple health risks of obesity as well as the benefit of weight loss to improve her health. She was advised of the need for long term treatment and the importance of lifestyle modifications.  AGREE: Multiple dietary modification options and treatment options were discussed and  Susan Dorsey agreed to the above obesity treatment plan.   Cristi Loron, am acting as transcriptionist for [employer], PA-C I, [employer] [facility], have reviewed this note and agree with its content

## 2018-05-29 ENCOUNTER — Encounter (INDEPENDENT_AMBULATORY_CARE_PROVIDER_SITE_OTHER): Payer: Self-pay

## 2018-05-29 ENCOUNTER — Telehealth (INDEPENDENT_AMBULATORY_CARE_PROVIDER_SITE_OTHER): Payer: Self-pay | Admitting: Physician Assistant

## 2018-05-29 NOTE — Telephone Encounter (Signed)
Sent the patient a my chart message. April, CMA

## 2018-05-29 NOTE — Telephone Encounter (Signed)
Patient called, stated that she saw Freddi CheSahar a couple weeks ago and had requested a refill on her Vitamin D, but it has not been called in.  She uses [facility] at [address].  [phone]

## 2018-06-06 DIAGNOSIS — Z4651 Encounter for fitting and adjustment of gastric lap band: Secondary | ICD-10-CM | POA: Diagnosis not present

## 2018-06-07 ENCOUNTER — Ambulatory Visit (INDEPENDENT_AMBULATORY_CARE_PROVIDER_SITE_OTHER): Payer: 59 | Admitting: Physician Assistant

## 2018-06-07 VITALS — BP 137/86 | HR 71 | Temp 98.1°F | Ht 68.0 in | Wt 230.0 lb

## 2018-06-07 DIAGNOSIS — Z9189 Other specified personal risk factors, not elsewhere classified: Secondary | ICD-10-CM

## 2018-06-07 DIAGNOSIS — Z9889 Other specified postprocedural states: Secondary | ICD-10-CM

## 2018-06-07 DIAGNOSIS — Z6837 Body mass index (BMI) 37.0-37.9, adult: Secondary | ICD-10-CM | POA: Diagnosis not present

## 2018-06-07 DIAGNOSIS — E559 Vitamin D deficiency, unspecified: Secondary | ICD-10-CM

## 2018-06-07 MED ORDER — VITAMIN D (ERGOCALCIFEROL) 1.25 MG (50000 UNIT) PO CAPS: ORAL_CAPSULE | ORAL | 0 refills | Status: DC

## 2018-06-07 NOTE — Progress Notes (Signed)
Office: [phone]  /  Fax: [phone]   HPI:   Chief Complaint: OBESITY Susan Dorsey is here to discuss her progress with her obesity treatment plan. She is on the lower carbohydrate, vegetable and lean protein rich diet plan and is following her eating plan approximately 98 % of the time. She states she is exercising 0 minutes 0 times per week. Susan Dorsey enjoyed the low carb meal plan. She would like to incorporate more variety into her meals. Her weight is 230 lb (104.3 kg) today and has had a weight loss of 5 pounds over a period of 3 weeks since her last visit. She has lost 18 lbs since starting treatment with [place].  Vitamin D deficiency Susan Dorsey has a diagnosis of vitamin D deficiency. She is currently taking vit D and denies nausea, vomiting or muscle weakness.  At risk for osteopenia and osteoporosis Susan Dorsey is at higher risk of osteopenia and osteoporosis due to vitamin D deficiency.   Status Post Bariatric Surgery Susan Dorsey relates that she had recent adjustment of her lap band.  ALLERGIES: Allergies  Allergen Reactions  . Aspirin Swelling    Lips only.  . Chocolate   . Gluten Meal     MEDICATIONS: Current Outpatient Medications on File Prior to Visit  Medication Sig Dispense Refill  . Biotin 1000 MCG CHEW Chew by mouth daily.    . Black Elderberry,Berry-Flower, 575 MG CAPS Take 1 capsule by mouth daily.    . cetirizine (ZYRTEC) 10 MG tablet Take 10 mg by mouth daily.     . DHA-EPA-Vitamin E (OMEGA-3 COMPLEX PO) Take by mouth daily.    . diphenhydrAMINE (BENADRYL) 25 MG tablet Take 25 mg by mouth at bedtime as needed.    Marland Kitchen FIBER ADULT GUMMIES 2 g CHEW Chew 2 tablets by mouth 2 (two) times daily.    . fluticasone (FLONASE) 50 MCG/ACT nasal spray Place 2 sprays into the nose daily.     . LOW-OGESTREL 0.3-30 MG-MCG tablet Take 1 tablet by mouth daily.     . Magnesium 250 MG TABS Take 1 tablet by mouth daily.    . Melatonin 5 MG CAPS Take by mouth daily.    . Multiple Vitamin (MULTIVITAMIN  WITH MINERALS) TABS Take 1 tablet by mouth daily.    . naproxen sodium (ALEVE) 220 MG tablet Take 220 mg by mouth 2 (two) times daily as needed.    Marland Kitchen omeprazole (PRILOSEC) 40 MG capsule Take 40 mg by mouth 2 (two) times daily.     . Plant Sterols and Stanols (CHOLEST OFF PO) Take 1 tablet 2 (two) times daily by mouth.    Marland Kitchen VITAMIN B COMPLEX-C CAPS Take 1 capsule by mouth daily.     No current facility-administered medications on file prior to visit.     PAST MEDICAL HISTORY: Past Medical History:  Diagnosis Date  . Allergy   . Anemia   . B12 deficiency   . Chronic kidney disease    hx of uti   . Gallbladder problem   . GERD (gastroesophageal reflux disease)   . Gluten intolerance   . Gluten intolerance   . Hyperlipidemia    borderline  . Hypertension    Resolved after LAGB  . Joint pain   . Keratoconus   . Morbid obesity (HCC)   . Obesity   . Peeling of nails   . Swallowing difficulty   . Swelling    feet and legs  . TMJ (dislocation of temporomandibular joint)   .  Vitamin D deficiency   . Wears glasses   . White coat syndrome with diagnosis of hypertension     PAST SURGICAL HISTORY: Past Surgical History:  Procedure Laterality Date  . CHOLECYSTECTOMY  1990's   Patient unsure of date  . LAPAROSCOPIC GASTRIC BANDING  01/23/2007  . PANNICULECTOMY  09/24/2012   Procedure: PANNICULECTOMY;  Surgeon: Valarie Merino, MD;  Location: [facility];  Service: General;  Laterality: N/A;  . TONSILECTOMY, ADENOIDECTOMY, BILATERAL MYRINGOTOMY AND TUBES  1972    SOCIAL HISTORY: Social History   Tobacco Use  . Smoking status: Never Smoker  . Smokeless tobacco: Never Used  Substance Use Topics  . Alcohol use: No  . Drug use: No    FAMILY HISTORY: Family History  Problem Relation Age of Onset  . Hypertension Mother   . Arthritis Mother   . Diabetes Mother   . Hyperlipidemia Mother   . Heart disease Mother   . Depression Mother   . Anxiety disorder Mother   . Sleep apnea  Mother   . Eating disorder Mother   . Obstructive Sleep Apnea Mother   . Obesity Mother   . Cancer Father        lliver, pancreas, stomach  . Liver disease Father   . Chronic fatigue Sister     ROS: Review of Systems  Constitutional: Positive for weight loss.  Gastrointestinal: Negative for nausea and vomiting.  Musculoskeletal:       Negative for muscle weakness    PHYSICAL EXAM: Blood pressure 137/86, pulse 71, temperature 98.1 F (36.7 C), temperature source Oral, height 5\' 8"  (1.727 m), weight 230 lb (104.3 kg), SpO2 98 %. Body mass index is 34.97 kg/m. Physical Exam  Constitutional: She is oriented to person, place, and time. She appears well-developed and well-nourished.  Cardiovascular: Normal rate.  Pulmonary/Chest: Effort normal.  Musculoskeletal: Normal range of motion.  Neurological: She is oriented to person, place, and time.  Skin: Skin is warm and dry.  Psychiatric: She has a normal mood and affect. Her behavior is normal.  Vitals reviewed.   RECENT LABS AND TESTS: BMET    Component Value Date/Time   NA 141 01/08/2018 0818   K 4.9 01/08/2018 0818   CL 105 01/08/2018 0818   CO2 21 01/08/2018 0818   GLUCOSE 77 01/08/2018 0818   GLUCOSE 85 09/26/2012 0414   BUN 14 01/08/2018 0818   CREATININE 0.84 01/08/2018 0818   CALCIUM 9.5 01/08/2018 0818   GFRNONAA 79 01/08/2018 0818   GFRAA 91 01/08/2018 0818   Lab Results  Component Value Date   HGBA1C 5.2 01/08/2018   HGBA1C 5.1 07/14/2017   Lab Results  Component Value Date   INSULIN 5.6 01/08/2018   INSULIN 6.0 07/14/2017   CBC    Component Value Date/Time   WBC 7.2 01/08/2018 0818   WBC 9.7 09/26/2012 0414   RBC 4.90 01/08/2018 0818   RBC 4.19 09/26/2012 0414   HGB 13.5 01/08/2018 0818   HCT 41.8 01/08/2018 0818   PLT 228 09/26/2012 0414   MCV 85 01/08/2018 0818   MCH 27.6 01/08/2018 0818   MCH 29.6 09/26/2012 0414   MCHC 32.3 01/08/2018 0818   MCHC 32.7 09/26/2012 0414   RDW 15.9 (H)  01/08/2018 0818   LYMPHSABS 2.2 01/08/2018 0818   EOSABS 0.1 01/08/2018 0818   BASOSABS 0.1 01/08/2018 0818   Iron/TIBC/Ferritin/ %Sat No results found for: IRON, TIBC, FERRITIN, IRONPCTSAT Lipid Panel     Component Value Date/Time  CHOL 192 01/08/2018 0818   TRIG 79 01/08/2018 0818   HDL 49 01/08/2018 0818   LDLCALC 127 (H) 01/08/2018 0818   Hepatic Function Panel     Component Value Date/Time   PROT 7.0 01/08/2018 0818   ALBUMIN 4.3 01/08/2018 0818   AST 19 01/08/2018 0818   ALT 18 01/08/2018 0818   ALKPHOS 83 01/08/2018 0818   BILITOT 0.2 01/08/2018 0818      Component Value Date/Time   TSH 2.500 01/08/2018 0818   TSH 2.560 07/14/2017 0935   Results for Seven, [place] Patsye J (MRN [id number]) as of 06/07/2018 13:19  Ref. Range 01/08/2018 08:18  Vitamin D, 25-Hydroxy Latest Ref Range: 30.0 - 100.0 ng/mL 44.8   ASSESSMENT AND PLAN: Vitamin D deficiency - Plan: Vitamin D, Ergocalciferol, (DRISDOL) 50000 units CAPS capsule  Status post gastric surgery  At risk for osteoporosis  Class 2 severe obesity with serious comorbidity and body mass index (BMI) of 37.0 to 37.9 in adult, unspecified obesity type (HCC)  PLAN:  Vitamin D Deficiency Ezma was informed that low vitamin D levels contributes to fatigue and are associated with obesity, breast, and colon cancer. She agrees to continue to take prescription Vit D @50 ,000 IU every week #4 with no refills and will follow up for routine testing of vitamin D, at least 2-3 times per year. She was informed of the risk of over-replacement of vitamin D and agrees to not increase her dose unless she discusses this with [place] first. Talyia agrees to follow up as directed.  At risk for osteopenia and osteoporosis Mirage is at risk for osteopenia and osteoporosis due to her vitamin D deficiency. She was encouraged to take her vitamin D and follow her higher calcium diet and increase strengthening exercise to help strengthen her bones and decrease her risk  of osteopenia and osteoporosis.  Status Post Bariatric Surgery Aryaa will continue to follow up with her surgeon as suggested. Ameilia agrees to follow up with our clinic in 4 weeks.  Obesity Dayzha is currently in the action stage of change. As such, her goal is to continue with weight loss efforts She has agreed to keep a food journal with 1500 calories and 95 grams of protein daily Irene has been instructed to work up to a goal of 150 minutes of combined cardio and strengthening exercise per week for weight loss and overall health benefits. We discussed the following Behavioral Modification Strategies today: increasing lean protein intake and keep a strict food journal  Andrey has agreed to follow up with our clinic in 4 weeks. She was informed of the importance of frequent follow up visits to maximize her success with intensive lifestyle modifications for her multiple health conditions.   OBESITY BEHAVIORAL INTERVENTION VISIT  Today's visit was # 13 out of 22.  Starting weight: 248 lbs Starting date: 07/14/17 Today's weight : 230 lbs Today's date: 06/07/2018 Total lbs lost to date: 31 (Patients must lose 7 lbs in the first 6 months to continue with counseling)   ASK: We discussed the diagnosis of obesity with Hallelujah Jarome Matin today and Ayslin agreed to give [place] permission to discuss obesity behavioral modification therapy today.  ASSESS: Monzerrat has the diagnosis of obesity and her BMI today is 34.98 Jelani is in the action stage of change   ADVISE: Keirra was educated on the multiple health risks of obesity as well as the benefit of weight loss to improve her health. She was advised of the need for long term  treatment and the importance of lifestyle modifications.  AGREE: Multiple dietary modification options and treatment options were discussed and  Ekaterini agreed to the above obesity treatment plan.   Cristi LoronI, Joanne Murray, am acting as transcriptionist for Solectron CorporationSahar Mita Vallo, PA-C I, Illa LevelSahar Axelle Szwed [facility], have reviewed  this note and agree with its content

## 2018-07-05 ENCOUNTER — Ambulatory Visit (INDEPENDENT_AMBULATORY_CARE_PROVIDER_SITE_OTHER): Payer: 59 | Admitting: Physician Assistant

## 2018-07-16 DIAGNOSIS — J3081 Allergic rhinitis due to animal (cat) (dog) hair and dander: Secondary | ICD-10-CM | POA: Diagnosis not present

## 2018-07-16 DIAGNOSIS — J301 Allergic rhinitis due to pollen: Secondary | ICD-10-CM | POA: Diagnosis not present

## 2018-07-16 DIAGNOSIS — J3089 Other allergic rhinitis: Secondary | ICD-10-CM | POA: Diagnosis not present

## 2018-07-18 DIAGNOSIS — Z9889 Other specified postprocedural states: Secondary | ICD-10-CM | POA: Diagnosis not present

## 2018-07-18 DIAGNOSIS — Z4651 Encounter for fitting and adjustment of gastric lap band: Secondary | ICD-10-CM | POA: Diagnosis not present

## 2018-07-19 ENCOUNTER — Ambulatory Visit (INDEPENDENT_AMBULATORY_CARE_PROVIDER_SITE_OTHER): Payer: 59 | Admitting: Family Medicine

## 2018-07-19 VITALS — BP 143/88 | HR 56 | Temp 98.0°F | Ht 68.0 in | Wt 236.0 lb

## 2018-07-19 DIAGNOSIS — E559 Vitamin D deficiency, unspecified: Secondary | ICD-10-CM | POA: Diagnosis not present

## 2018-07-19 DIAGNOSIS — Z9189 Other specified personal risk factors, not elsewhere classified: Secondary | ICD-10-CM | POA: Diagnosis not present

## 2018-07-19 DIAGNOSIS — Z6835 Body mass index (BMI) 35.0-35.9, adult: Secondary | ICD-10-CM | POA: Diagnosis not present

## 2018-07-19 MED ORDER — VITAMIN D (ERGOCALCIFEROL) 1.25 MG (50000 UNIT) PO CAPS: ORAL_CAPSULE | ORAL | 0 refills | Status: DC

## 2018-07-19 NOTE — Progress Notes (Signed)
Office: [phone]  /  Fax: [phone]   HPI:   Chief Complaint: OBESITY Susan Dorsey is here to discuss her progress with her obesity treatment plan. She is on the keep a food journal with 1500 calories and 95 grams of protein daily and is following her eating plan approximately 50 % of the time. She states she is exercising 0 minutes 0 times per week. Susan Dorsey is retaining fluid today. She had increased esophageal dilation earlier this week and had fluid removed from her lap-band. The fluid is replaced now and she has 8cc in a 10cc band with significant increased restriction. Her weight is 236 lb (107 kg) today and has had a weight gain of 6 pounds over a period of 6 weeks since her last visit. She has lost 12 lbs since starting treatment with [place].  Vitamin D deficiency Susan Dorsey has a diagnosis of vitamin D deficiency. She is stable on vit D and her last level was at goal. She denies nausea, vomiting or muscle weakness.  At risk for osteopenia and osteoporosis Susan Dorsey is at higher risk of osteopenia and osteoporosis due to vitamin D deficiency.   ALLERGIES: Allergies  Allergen Reactions  . Aspirin Swelling    Lips only.  . Chocolate   . Gluten Meal     MEDICATIONS: Current Outpatient Medications on File Prior to Visit  Medication Sig Dispense Refill  . Biotin 1000 MCG CHEW Chew by mouth daily.    . cetirizine (ZYRTEC) 10 MG tablet Take 10 mg by mouth daily.     . DHA-EPA-Vitamin E (OMEGA-3 COMPLEX PO) Take by mouth daily.    . diphenhydrAMINE (BENADRYL) 25 MG tablet Take 25 mg by mouth at bedtime as needed.    Marland Kitchen FIBER ADULT GUMMIES 2 g CHEW Chew 2 tablets by mouth 2 (two) times daily.    . fluticasone (FLONASE) 50 MCG/ACT nasal spray Place 2 sprays into the nose daily.     . LOW-OGESTREL 0.3-30 MG-MCG tablet Take 1 tablet by mouth daily.     . Magnesium 250 MG TABS Take 1 tablet by mouth daily.    . Melatonin 5 MG CAPS Take by mouth daily.    . montelukast (SINGULAIR) 10 MG tablet Take 10 mg  by mouth at bedtime.    . Multiple Vitamin (MULTIVITAMIN WITH MINERALS) TABS Take 1 tablet by mouth daily.    Marland Kitchen omeprazole (PRILOSEC) 40 MG capsule Take 40 mg by mouth 2 (two) times daily.     . Plant Sterols and Stanols (CHOLEST OFF PO) Take 1 tablet 2 (two) times daily by mouth.     No current facility-administered medications on file prior to visit.     PAST MEDICAL HISTORY: Past Medical History:  Diagnosis Date  . Allergy   . Anemia   . B12 deficiency   . Chronic kidney disease    hx of uti   . Gallbladder problem   . GERD (gastroesophageal reflux disease)   . Gluten intolerance   . Gluten intolerance   . Hyperlipidemia    borderline  . Hypertension    Resolved after LAGB  . Joint pain   . Keratoconus   . Morbid obesity (HCC)   . Obesity   . Peeling of nails   . Swallowing difficulty   . Swelling    feet and legs  . TMJ (dislocation of temporomandibular joint)   . Vitamin D deficiency   . Wears glasses   . White coat syndrome with diagnosis of hypertension  PAST SURGICAL HISTORY: Past Surgical History:  Procedure Laterality Date  . CHOLECYSTECTOMY  1990's   Patient unsure of date  . LAPAROSCOPIC GASTRIC BANDING  01/23/2007  . PANNICULECTOMY  09/24/2012   Procedure: PANNICULECTOMY;  Surgeon: Valarie Merino, MD;  Location: [facility];  Service: General;  Laterality: N/A;  . TONSILECTOMY, ADENOIDECTOMY, BILATERAL MYRINGOTOMY AND TUBES  1972    SOCIAL HISTORY: Social History   Tobacco Use  . Smoking status: Never Smoker  . Smokeless tobacco: Never Used  Substance Use Topics  . Alcohol use: No  . Drug use: No    FAMILY HISTORY: Family History  Problem Relation Age of Onset  . Hypertension Mother   . Arthritis Mother   . Diabetes Mother   . Hyperlipidemia Mother   . Heart disease Mother   . Depression Mother   . Anxiety disorder Mother   . Sleep apnea Mother   . Eating disorder Mother   . Obstructive Sleep Apnea Mother   . Obesity Mother   .  Cancer Father        lliver, pancreas, stomach  . Liver disease Father   . Chronic fatigue Sister     ROS: Review of Systems  Constitutional: Negative for weight loss.  Gastrointestinal: Negative for nausea and vomiting.  Musculoskeletal:       Negative for muscle weakness    PHYSICAL EXAM: Blood pressure (!) 143/88, pulse (!) 56, temperature 98 F (36.7 C), temperature source Oral, height 5\' 8"  (1.727 m), weight 236 lb (107 kg), SpO2 99 %. Body mass index is 35.88 kg/m. Physical Exam  Constitutional: She is oriented to person, place, and time. She appears well-developed and well-nourished.  Cardiovascular: Normal rate.  Pulmonary/Chest: Effort normal.  Musculoskeletal: Normal range of motion.  Neurological: She is oriented to person, place, and time.  Skin: Skin is warm and dry.  Psychiatric: She has a normal mood and affect. Her behavior is normal.  Vitals reviewed.   RECENT LABS AND TESTS: BMET    Component Value Date/Time   NA 141 01/08/2018 0818   K 4.9 01/08/2018 0818   CL 105 01/08/2018 0818   CO2 21 01/08/2018 0818   GLUCOSE 77 01/08/2018 0818   GLUCOSE 85 09/26/2012 0414   BUN 14 01/08/2018 0818   CREATININE 0.84 01/08/2018 0818   CALCIUM 9.5 01/08/2018 0818   GFRNONAA 79 01/08/2018 0818   GFRAA 91 01/08/2018 0818   Lab Results  Component Value Date   HGBA1C 5.2 01/08/2018   HGBA1C 5.1 07/14/2017   Lab Results  Component Value Date   INSULIN 5.6 01/08/2018   INSULIN 6.0 07/14/2017   CBC    Component Value Date/Time   WBC 7.2 01/08/2018 0818   WBC 9.7 09/26/2012 0414   RBC 4.90 01/08/2018 0818   RBC 4.19 09/26/2012 0414   HGB 13.5 01/08/2018 0818   HCT 41.8 01/08/2018 0818   PLT 228 09/26/2012 0414   MCV 85 01/08/2018 0818   MCH 27.6 01/08/2018 0818   MCH 29.6 09/26/2012 0414   MCHC 32.3 01/08/2018 0818   MCHC 32.7 09/26/2012 0414   RDW 15.9 (H) 01/08/2018 0818   LYMPHSABS 2.2 01/08/2018 0818   EOSABS 0.1 01/08/2018 0818   BASOSABS 0.1  01/08/2018 0818   Iron/TIBC/Ferritin/ %Sat No results found for: IRON, TIBC, FERRITIN, IRONPCTSAT Lipid Panel     Component Value Date/Time   CHOL 192 01/08/2018 0818   TRIG 79 01/08/2018 0818   HDL 49 01/08/2018 0818   LDLCALC  127 (H) 01/08/2018 0818   Hepatic Function Panel     Component Value Date/Time   PROT 7.0 01/08/2018 0818   ALBUMIN 4.3 01/08/2018 0818   AST 19 01/08/2018 0818   ALT 18 01/08/2018 0818   ALKPHOS 83 01/08/2018 0818   BILITOT 0.2 01/08/2018 0818      Component Value Date/Time   TSH 2.500 01/08/2018 0818   TSH 2.560 07/14/2017 0935   Results for Norberto SorensonWASHBURN, Mele J (MRN [id number]) as of 07/19/2018 13:22  Ref. Range 01/08/2018 08:18  Vitamin D, 25-Hydroxy Latest Ref Range: 30.0 - 100.0 ng/mL 44.8   ASSESSMENT AND PLAN: Vitamin D deficiency - Plan: Vitamin D, Ergocalciferol, (DRISDOL) 50000 units CAPS capsule  At risk for osteoporosis  Class 2 severe obesity with serious comorbidity and body mass index (BMI) of 35.0 to 35.9 in adult, unspecified obesity type (HCC)  PLAN:  Vitamin D Deficiency Susan Dorsey was informed that low vitamin D levels contributes to fatigue and are associated with obesity, breast, and colon cancer. She agrees to continue to take prescription Vit D @50 ,000 IU every 14 days #2 with no refills and will follow up for routine testing of vitamin D, at least 2-3 times per year. She was informed of the risk of over-replacement of vitamin D and agrees to not increase her dose unless she discusses this with us first. Andreana agrees to follow up as directed.  At risk for osteopenia and osteoporosis Susan Dorsey is at risk for osteopenia and osteoporosis due to her vitamin D deficiency. She was encouraged to take her vitamin D and follow her higher calcium diet and increase strengthening exercise to help strengthen her bones and decrease her risk of osteopenia and osteoporosis.  Obesity Susan Dorsey is currently in the action stage of change. As such, her goal is to  continue with weight loss efforts She has agreed to keep a food journal with 1500 calories and 90+ grams of protein daily Susan Dorsey has been instructed to work up to a goal of 150 minutes of combined cardio and strengthening exercise per week for weight loss and overall health benefits. We discussed the following Behavioral Modification Strategies today: increasing lean protein intake, increase H2O intake, better snacking choices and planning for success Soft high protein foods were discussed today.  Susan Dorsey has agreed to follow up with our clinic in 4 weeks. She was informed of the importance of frequent follow up visits to maximize her success with intensive lifestyle modifications for her multiple health conditions.   OBESITY BEHAVIORAL INTERVENTION VISIT  Today's visit was # 14 out of 22.  Starting weight: 248 lbs Starting date: 07/14/17 Today's weight : 236 lbs  Today's date: 07/19/2018 Total lbs lost to date: 12    ASK: We discussed the diagnosis of obesity with Susan Dorsey today and Susan Dorsey agreed to give us permission to discuss obesity behavioral modification therapy today.  ASSESS: Anam has the diagnosis of obesity and her BMI today is 35.89 Brandalynn is in the action stage of change   ADVISE: Susan Dorsey was educated on the multiple health risks of obesity as well as the benefit of weight loss to improve her health. She was advised of the need for long term treatment and the importance of lifestyle modifications.  AGREE: Multiple dietary modification options and treatment options were discussed and  Susan Dorsey agreed to the above obesity treatment plan.  I, [place] CraneJoanne Murray, am acting as transcriptionist for Quillian Quincearen Talli Kimmer, MD  I have reviewed the above documentation for accuracy and completeness,  and I agree with the above. -Dennard Nip, MD

## 2018-07-25 DIAGNOSIS — R590 Localized enlarged lymph nodes: Secondary | ICD-10-CM | POA: Diagnosis not present

## 2018-07-25 DIAGNOSIS — Z8619 Personal history of other infectious and parasitic diseases: Secondary | ICD-10-CM | POA: Diagnosis not present

## 2018-07-25 DIAGNOSIS — J029 Acute pharyngitis, unspecified: Secondary | ICD-10-CM | POA: Diagnosis not present

## 2018-07-30 ENCOUNTER — Other Ambulatory Visit: Payer: Self-pay | Admitting: Student

## 2018-07-30 DIAGNOSIS — K219 Gastro-esophageal reflux disease without esophagitis: Secondary | ICD-10-CM

## 2018-08-02 ENCOUNTER — Ambulatory Visit
Admission: RE | Admit: 2018-08-02 | Discharge: 2018-08-02 | Disposition: A | Discharge status: Home / Self Care | Payer: Commercial Managed Care - HMO | Source: Ambulatory Visit | Attending: Student | Admitting: Student

## 2018-08-02 ENCOUNTER — Other Ambulatory Visit: Payer: Self-pay | Admitting: Student

## 2018-08-02 DIAGNOSIS — K219 Gastro-esophageal reflux disease without esophagitis: Secondary | ICD-10-CM

## 2018-08-02 DIAGNOSIS — K449 Diaphragmatic hernia without obstruction or gangrene: Secondary | ICD-10-CM | POA: Diagnosis not present

## 2018-08-15 DIAGNOSIS — Z4651 Encounter for fitting and adjustment of gastric lap band: Secondary | ICD-10-CM | POA: Diagnosis not present

## 2018-08-16 ENCOUNTER — Ambulatory Visit (INDEPENDENT_AMBULATORY_CARE_PROVIDER_SITE_OTHER): Payer: 59 | Admitting: Family Medicine

## 2018-08-30 ENCOUNTER — Ambulatory Visit (INDEPENDENT_AMBULATORY_CARE_PROVIDER_SITE_OTHER): Payer: Self-pay | Admitting: Family Medicine

## 2018-08-31 DIAGNOSIS — Z4651 Encounter for fitting and adjustment of gastric lap band: Secondary | ICD-10-CM | POA: Diagnosis not present

## 2018-09-13 ENCOUNTER — Ambulatory Visit (INDEPENDENT_AMBULATORY_CARE_PROVIDER_SITE_OTHER): Payer: 59 | Admitting: Family Medicine

## 2018-09-13 VITALS — BP 127/84 | HR 61 | Temp 98.0°F | Ht 66.0 in | Wt 240.0 lb

## 2018-09-13 DIAGNOSIS — E7849 Other hyperlipidemia: Secondary | ICD-10-CM | POA: Diagnosis not present

## 2018-09-13 DIAGNOSIS — Z6838 Body mass index (BMI) 38.0-38.9, adult: Secondary | ICD-10-CM

## 2018-09-13 DIAGNOSIS — Z9189 Other specified personal risk factors, not elsewhere classified: Secondary | ICD-10-CM | POA: Diagnosis not present

## 2018-09-13 DIAGNOSIS — E559 Vitamin D deficiency, unspecified: Secondary | ICD-10-CM | POA: Diagnosis not present

## 2018-09-13 DIAGNOSIS — R7303 Prediabetes: Secondary | ICD-10-CM

## 2018-09-13 MED ORDER — VITAMIN D (ERGOCALCIFEROL) 1.25 MG (50000 UNIT) PO CAPS: ORAL_CAPSULE | ORAL | 0 refills | Status: DC

## 2018-09-14 LAB — LIPID PANEL WITH LDL/HDL RATIO
Cholesterol, Total: 217 mg/dL — ABNORMAL HIGH (ref 100–199)
HDL: 55 mg/dL (ref >39)
LDL Calculated: 145 mg/dL — ABNORMAL HIGH (ref 0–99)
LDl/HDL Ratio: 2.6 ratio (ref 0.0–3.2)
Triglycerides: 83 mg/dL (ref 0–149)
VLDL Cholesterol Cal: 17 mg/dL (ref 5–40)

## 2018-09-14 LAB — COMPREHENSIVE METABOLIC PANEL
ALT: 16 IU/L (ref 0–32)
AST: 14 IU/L (ref 0–40)
Albumin/Globulin Ratio: 1.5 (ref 1.2–2.2)
Albumin: 3.9 g/dL (ref 3.5–5.5)
Alkaline Phosphatase: 73 IU/L (ref 39–117)
BUN/Creatinine Ratio: 19 (ref 9–23)
BUN: 15 mg/dL (ref 6–24)
Bilirubin Total: 0.4 mg/dL (ref 0.0–1.2)
CO2: 24 mmol/L (ref 20–29)
Calcium: 9.3 mg/dL (ref 8.7–10.2)
Chloride: 103 mmol/L (ref 96–106)
Creatinine, Ser: 0.8 mg/dL (ref 0.57–1.00)
GFR calc Af Amer: 96 mL/min/{1.73_m2} (ref >59)
GFR calc non Af Amer: 83 mL/min/{1.73_m2} (ref >59)
Globulin, Total: 2.6 g/dL (ref 1.5–4.5)
Glucose: 77 mg/dL (ref 65–99)
Potassium: 4.6 mmol/L (ref 3.5–5.2)
Sodium: 141 mmol/L (ref 134–144)
Total Protein: 6.5 g/dL (ref 6.0–8.5)

## 2018-09-14 LAB — VITAMIN D 25 HYDROXY (VIT D DEFICIENCY, FRACTURES): Vit D, 25-Hydroxy: 45.5 ng/mL (ref 30.0–100.0)

## 2018-09-14 LAB — HEMOGLOBIN A1C
Est. average glucose Bld gHb Est-mCnc: 100 mg/dL
Hgb A1c MFr Bld: 5.1 % (ref 4.8–5.6)

## 2018-09-14 LAB — INSULIN, RANDOM: INSULIN: 5.3 u[IU]/mL (ref 2.6–24.9)

## 2018-09-17 NOTE — Progress Notes (Signed)
Office: [phone]  /  Fax: [phone]   HPI:   Chief Complaint: OBESITY Susan Dorsey is here to discuss her progress with her obesity treatment plan. She is on the  keep a food journal with 1500 calories and 90 protein  and is following her eating plan approximately 25 % of the time. She states she is exercising 0 minutes 0 times per week.Susan Dorsey's last visit was 2 months ago. She has experienced multiple health issues and frequent stressors including her lap band slippage. She is ready to get back on track at this time. Her weight is 240 lb (108.9 kg) today and has gained 4 lbs since her last visit. She has lost 12 lbs since starting treatment with [place].  Vitamin D deficiency Susan Dorsey has a diagnosis of vitamin D deficiency. She is currently taking vit D and denies nausea, vomiting or muscle weakness.  Pre-Diabetes Susan Dorsey has a diagnosis of prediabetes based on her elevated HgA1c and was informed this puts her at greater risk of developing diabetes. She is not taking metformin currently and continues to work on diet and exercise to decrease risk of diabetes. She denies nausea or hypoglycemia. Patient is attempting to improve her diet after getting off track with her recovery but ready to restart.   Hyperlipidemia Susan Dorsey has hyperlipidemia and has been trying to improve her cholesterol levels with intensive lifestyle modification including a low saturated fat diet, exercise and weight loss. She denies any chest pain, claudication or myalgias.  At risk for cardiovascular disease Susan Dorsey is at a higher than average risk for cardiovascular disease due to obesity. She currently denies any chest pain.   ALLERGIES: Allergies  Allergen Reactions  . Aspirin Swelling    Lips only.  . Chocolate   . Gluten Meal     MEDICATIONS: Current Outpatient Medications on File Prior to Visit  Medication Sig Dispense Refill  . Biotin 1000 MCG CHEW Chew by mouth daily.    . cetirizine (ZYRTEC) 10 MG tablet Take 10 mg by mouth  daily.     . DHA-EPA-Vitamin E (OMEGA-3 COMPLEX PO) Take by mouth daily.    . diphenhydrAMINE (BENADRYL) 25 MG tablet Take 25 mg by mouth at bedtime as needed.    Marland Kitchen FIBER ADULT GUMMIES 2 g CHEW Chew 2 tablets by mouth 2 (two) times daily.    . fluticasone (FLONASE) 50 MCG/ACT nasal spray Place 2 sprays into the nose daily.     . LOW-OGESTREL 0.3-30 MG-MCG tablet Take 1 tablet by mouth daily.     . Magnesium 250 MG TABS Take 1 tablet by mouth daily.    . Melatonin 5 MG CAPS Take by mouth daily.    . montelukast (SINGULAIR) 10 MG tablet Take 10 mg by mouth at bedtime.    . Multiple Vitamin (MULTIVITAMIN WITH MINERALS) TABS Take 1 tablet by mouth daily.    Marland Kitchen omeprazole (PRILOSEC) 40 MG capsule Take 40 mg by mouth 2 (two) times daily.     . Plant Sterols and Stanols (CHOLEST OFF PO) Take 1 tablet 2 (two) times daily by mouth.     No current facility-administered medications on file prior to visit.     PAST MEDICAL HISTORY: Past Medical History:  Diagnosis Date  . Allergy   . Anemia   . B12 deficiency   . Chronic kidney disease    hx of uti   . Gallbladder problem   . GERD (gastroesophageal reflux disease)   . Gluten intolerance   . Gluten  intolerance   . Hyperlipidemia    borderline  . Hypertension    Resolved after LAGB  . Joint pain   . Keratoconus   . Morbid obesity (HCC)   . Obesity   . Peeling of nails   . Swallowing difficulty   . Swelling    feet and legs  . TMJ (dislocation of temporomandibular joint)   . Vitamin D deficiency   . Wears glasses   . White coat syndrome with diagnosis of hypertension     PAST SURGICAL HISTORY: Past Surgical History:  Procedure Laterality Date  . CHOLECYSTECTOMY  1990's   Patient unsure of date  . LAPAROSCOPIC GASTRIC BANDING  01/23/2007  . PANNICULECTOMY  09/24/2012   Procedure: PANNICULECTOMY;  Surgeon: Valarie Merino, MD;  Location: [facility];  Service: General;  Laterality: N/A;  . TONSILECTOMY, ADENOIDECTOMY, BILATERAL  MYRINGOTOMY AND TUBES  1972    SOCIAL HISTORY: Social History   Tobacco Use  . Smoking status: Never Smoker  . Smokeless tobacco: Never Used  Substance Use Topics  . Alcohol use: No  . Drug use: No    FAMILY HISTORY: Family History  Problem Relation Age of Onset  . Hypertension Mother   . Arthritis Mother   . Diabetes Mother   . Hyperlipidemia Mother   . Heart disease Mother   . Depression Mother   . Anxiety disorder Mother   . Sleep apnea Mother   . Eating disorder Mother   . Obstructive Sleep Apnea Mother   . Obesity Mother   . Cancer Father        lliver, pancreas, stomach  . Liver disease Father   . Chronic fatigue Sister     ROS: Review of Systems  Constitutional: Negative for weight loss.  All other systems reviewed and are negative.   PHYSICAL EXAM: Blood pressure 127/84, pulse 61, temperature 98 F (36.7 C), temperature source Oral, height 5\' 6"  (1.676 m), weight 240 lb (108.9 kg), SpO2 100 %. Body mass index is 38.74 kg/m. Physical Exam  Constitutional: She is oriented to person, place, and time. She appears well-developed and well-nourished.  HENT:  Head: Normocephalic.  Eyes: EOM are normal.  Neck: Normal range of motion.  Pulmonary/Chest: Effort normal.  Musculoskeletal: Normal range of motion.  Neurological: She is alert and oriented to person, place, and time.  Skin: Skin is warm and dry.  Psychiatric: She has a normal mood and affect. Her behavior is normal.  Vitals reviewed.   RECENT LABS AND TESTS: BMET    Component Value Date/Time   NA 141 09/13/2018 1125   K 4.6 09/13/2018 1125   CL 103 09/13/2018 1125   CO2 24 09/13/2018 1125   GLUCOSE 77 09/13/2018 1125   GLUCOSE 85 09/26/2012 0414   BUN 15 09/13/2018 1125   CREATININE 0.80 09/13/2018 1125   CALCIUM 9.3 09/13/2018 1125   GFRNONAA 83 09/13/2018 1125   GFRAA 96 09/13/2018 1125   Lab Results  Component Value Date   HGBA1C 5.1 09/13/2018   HGBA1C 5.2 01/08/2018   HGBA1C  5.1 07/14/2017   Lab Results  Component Value Date   INSULIN 5.3 09/13/2018   INSULIN 5.6 01/08/2018   INSULIN 6.0 07/14/2017   CBC    Component Value Date/Time   WBC 7.2 01/08/2018 0818   WBC 9.7 09/26/2012 0414   RBC 4.90 01/08/2018 0818   RBC 4.19 09/26/2012 0414   HGB 13.5 01/08/2018 0818   HCT 41.8 01/08/2018 0818   PLT 228  09/26/2012 0414   MCV 85 01/08/2018 0818   MCH 27.6 01/08/2018 0818   MCH 29.6 09/26/2012 0414   MCHC 32.3 01/08/2018 0818   MCHC 32.7 09/26/2012 0414   RDW 15.9 (H) 01/08/2018 0818   LYMPHSABS 2.2 01/08/2018 0818   EOSABS 0.1 01/08/2018 0818   BASOSABS 0.1 01/08/2018 0818   Iron/TIBC/Ferritin/ %Sat No results found for: IRON, TIBC, FERRITIN, IRONPCTSAT Lipid Panel     Component Value Date/Time   CHOL 217 (H) 09/13/2018 1125   TRIG 83 09/13/2018 1125   HDL 55 09/13/2018 1125   LDLCALC 145 (H) 09/13/2018 1125   Hepatic Function Panel     Component Value Date/Time   PROT 6.5 09/13/2018 1125   ALBUMIN 3.9 09/13/2018 1125   AST 14 09/13/2018 1125   ALT 16 09/13/2018 1125   ALKPHOS 73 09/13/2018 1125   BILITOT 0.4 09/13/2018 1125      Component Value Date/Time   TSH 2.500 01/08/2018 0818   TSH 2.560 07/14/2017 0935    ASSESSMENT AND PLAN: Vitamin D deficiency - Plan: VITAMIN D 25 Hydroxy (Vit-D Deficiency, Fractures), Vitamin D, Ergocalciferol, (DRISDOL) 50000 units CAPS capsule  Prediabetes - Plan: Comprehensive metabolic panel, Hemoglobin A1c, Insulin, random  Other hyperlipidemia - Plan: Lipid Panel With LDL/HDL Ratio  At risk for heart disease  Class 2 severe obesity with serious comorbidity and body mass index (BMI) of 38.0 to 38.9 in adult, unspecified obesity type (HCC)  PLAN:  Vitamin D Deficiency Susan Dorsey was informed that low vitamin D levels contributes to fatigue and are associated with obesity, breast, and colon cancer. She agrees to continue to take prescription Vit D @50 ,000 IU every week and will follow up for routine  testing of vitamin D, at least 2-3 times per year. She was informed of the risk of over-replacement of vitamin D and agrees to not increase her dose unless she discusses this with us first.  Pre-Diabetes Susan Dorsey will continue to work on weight loss, exercise, and decreasing simple carbohydrates in her diet to help decrease the risk of diabetes. We dicussed metformin including benefits and risks. She was informed that eating too many simple carbohydrates or too many calories at one sitting increases the likelihood of GI side effects. Susan Dorsey declined metformin for now and a prescription was not written today. Susan Dorsey agreed to follow up with us as directed to monitor her progress.  Hyperlipidemia Susan Dorsey was informed of the American Heart Association Guidelines emphasizing intensive lifestyle modifications as the first line treatment for hyperlipidemia. We discussed many lifestyle modifications today in depth, and Susan Dorsey will continue to work on decreasing saturated fats such as fatty red meat, butter and many fried foods. She will also increase vegetables and lean protein in her diet and continue to work on exercise and weight loss efforts.  Cardiovascular risk counselling Susan Dorsey was given extended (15 minutes) coronary artery disease prevention counseling today. She is 57 y.o. female and has risk factors for heart disease including obesity. We discussed intensive lifestyle modifications today with an emphasis on specific weight loss instructions and strategies. Pt was also informed of the importance of increasing exercise and decreasing saturated fats to help prevent heart disease.  Obesity Susan Dorsey is currently in the action stage of change. As such, her goal is to continue with weight loss efforts She has agreed to keep a food journal with 1500 calories and 90 protein  Susan Dorsey has been instructed to work up to a goal of 150 minutes of combined cardio and strengthening  exercise per week for weight loss and overall health  benefits. We discussed the following Behavioral Modification Stratagies today: increasing lean protein intake, decreasing simple carbohydrates, work on meal planning and easy cooking plans and keep a strict food journal   Susan Dorsey has agreed to follow up with our clinic in 3 weeks. She was informed of the importance of frequent follow up visits to maximize her success with intensive lifestyle modifications for her multiple health conditions.   OBESITY BEHAVIORAL INTERVENTION VISIT  Today's visit was # 15   Starting weight: 248 lb Starting date: 07/14/17 Today's weight : Weight: 240 lb (108.9 kg)  Today's date: 09/13/18 Total lbs lost to date: 12   ASK: We discussed the diagnosis of obesity with Susan Dorsey Susan Dorsey today and Susan Dorsey agreed to give [place] permission to discuss obesity behavioral modification therapy today.  ASSESS: Susan Dorsey has the diagnosis of obesity and her BMI today is @TBMI @ Susan Dorsey is in the action stage of change   ADVISE: Susan Dorsey was educated on the multiple health risks of obesity as well as the benefit of weight loss to improve her health. She was advised of the need for long term treatment and the importance of lifestyle modifications to improve her current health and to decrease her risk of future health problems.  AGREE: Multiple dietary modification options and treatment options were discussed and  Susan Dorsey agreed to follow the recommendations documented in the above note.  ARRANGE: Susan Dorsey was educated on the importance of frequent visits to treat obesity as outlined per CMS and USPSTF guidelines and agreed to schedule her next follow up appointment today.  I, April Moore, am acting as transcriptionist for Dr Quillian Quince  I have reviewed the above documentation for accuracy and completeness, and I agree with the above. -Quillian Quince, MD

## 2018-10-02 ENCOUNTER — Ambulatory Visit (INDEPENDENT_AMBULATORY_CARE_PROVIDER_SITE_OTHER): Payer: 59 | Admitting: Family Medicine

## 2018-10-02 VITALS — BP 148/86 | HR 60 | Temp 98.4°F | Ht 66.0 in | Wt 246.0 lb

## 2018-10-02 DIAGNOSIS — Z6839 Body mass index (BMI) 39.0-39.9, adult: Secondary | ICD-10-CM | POA: Diagnosis not present

## 2018-10-02 DIAGNOSIS — E559 Vitamin D deficiency, unspecified: Secondary | ICD-10-CM | POA: Diagnosis not present

## 2018-10-02 DIAGNOSIS — Z9189 Other specified personal risk factors, not elsewhere classified: Secondary | ICD-10-CM

## 2018-10-02 MED ORDER — VITAMIN D (ERGOCALCIFEROL) 1.25 MG (50000 UNIT) PO CAPS: ORAL_CAPSULE | ORAL | 0 refills | Status: DC

## 2018-10-03 DIAGNOSIS — Z4651 Encounter for fitting and adjustment of gastric lap band: Secondary | ICD-10-CM | POA: Diagnosis not present

## 2018-10-03 NOTE — Progress Notes (Signed)
Office: [phone]  /  Fax: [phone]   HPI:   Chief Complaint: OBESITY Susan Dorsey is here to discuss her progress with her obesity treatment plan. She is keeping a food journal with 1500 calories and 90 grams of protein and is following her eating plan approximately 0 % of the time. She states she is exercising 0 minutes 0 times per week. Susan Dorsey had gotten off track over the last month with increased traveling and mother's recent death. She is ready to get back on track. She is status post lap band surgery and is scheduled to have her band refilled tomorrow.  Her weight is 246 lb (111.6 kg) today and has gained 6 pounds in 3 weeks since her last visit. She has lost 2 lbs since starting treatment with [place].  Vitamin D deficiency Susan Dorsey has a diagnosis of vitamin D deficiency. Her vitamin D level is stable, but not yet at goal. She is currently taking vit D and denies nausea, vomiting or muscle weakness.  At risk for osteopenia and osteoporosis Susan Dorsey is at higher risk of osteopenia and osteoporosis due to vitamin D deficiency.   ALLERGIES: Allergies  Allergen Reactions  . Aspirin Swelling    Lips only.  . Chocolate   . Gluten Meal     MEDICATIONS: Current Outpatient Medications on File Prior to Visit  Medication Sig Dispense Refill  . Biotin 1000 MCG CHEW Chew by mouth daily.    . cetirizine (ZYRTEC) 10 MG tablet Take 10 mg by mouth daily.     . DHA-EPA-Vitamin E (OMEGA-3 COMPLEX PO) Take by mouth daily.    . diphenhydrAMINE (BENADRYL) 25 MG tablet Take 25 mg by mouth at bedtime as needed.    Marland Kitchen FIBER ADULT GUMMIES 2 g CHEW Chew 2 tablets by mouth 2 (two) times daily.    . fluticasone (FLONASE) 50 MCG/ACT nasal spray Place 2 sprays into the nose daily.     . LOW-OGESTREL 0.3-30 MG-MCG tablet Take 1 tablet by mouth daily.     . Magnesium 250 MG TABS Take 1 tablet by mouth daily.    . Melatonin 5 MG CAPS Take by mouth daily.    . montelukast (SINGULAIR) 10 MG tablet Take 10 mg by mouth at  bedtime.    . Multiple Vitamin (MULTIVITAMIN WITH MINERALS) TABS Take 1 tablet by mouth daily.    Marland Kitchen omeprazole (PRILOSEC) 40 MG capsule Take 40 mg by mouth 2 (two) times daily.     . Plant Sterols and Stanols (CHOLEST OFF PO) Take 1 tablet 2 (two) times daily by mouth.     No current facility-administered medications on file prior to visit.     PAST MEDICAL HISTORY: Past Medical History:  Diagnosis Date  . Allergy   . Anemia   . B12 deficiency   . Chronic kidney disease    hx of uti   . Gallbladder problem   . GERD (gastroesophageal reflux disease)   . Gluten intolerance   . Gluten intolerance   . Hyperlipidemia    borderline  . Hypertension    Resolved after LAGB  . Joint pain   . Keratoconus   . Morbid obesity (HCC)   . Obesity   . Peeling of nails   . Swallowing difficulty   . Swelling    feet and legs  . TMJ (dislocation of temporomandibular joint)   . Vitamin D deficiency   . Wears glasses   . White coat syndrome with diagnosis of hypertension  PAST SURGICAL HISTORY: Past Surgical History:  Procedure Laterality Date  . CHOLECYSTECTOMY  1990's   Patient unsure of date  . LAPAROSCOPIC GASTRIC BANDING  01/23/2007  . PANNICULECTOMY  09/24/2012   Procedure: PANNICULECTOMY;  Surgeon: Valarie Merino, MD;  Location: [facility];  Service: General;  Laterality: N/A;  . TONSILECTOMY, ADENOIDECTOMY, BILATERAL MYRINGOTOMY AND TUBES  1972    SOCIAL HISTORY: Social History   Tobacco Use  . Smoking status: Never Smoker  . Smokeless tobacco: Never Used  Substance Use Topics  . Alcohol use: No  . Drug use: No    FAMILY HISTORY: Family History  Problem Relation Age of Onset  . Hypertension Mother   . Arthritis Mother   . Diabetes Mother   . Hyperlipidemia Mother   . Heart disease Mother   . Depression Mother   . Anxiety disorder Mother   . Sleep apnea Mother   . Eating disorder Mother   . Obstructive Sleep Apnea Mother   . Obesity Mother   . Cancer Father          lliver, pancreas, stomach  . Liver disease Father   . Chronic fatigue Sister     ROS: Review of Systems  Constitutional: Negative for weight loss.  Gastrointestinal: Negative for nausea and vomiting.  Musculoskeletal:       Negative for muscle weakness.    PHYSICAL EXAM: Blood pressure (!) 148/86, pulse 60, temperature 98.4 F (36.9 C), temperature source Oral, height 5\' 6"  (1.676 m), weight 246 lb (111.6 kg), SpO2 100 %. Body mass index is 39.71 kg/m. Physical Exam  Constitutional: She is oriented to person, place, and time. She appears well-developed and well-nourished.  Cardiovascular: Normal rate.  Pulmonary/Chest: Effort normal.  Musculoskeletal: Normal range of motion.  Neurological: She is oriented to person, place, and time.  Skin: Skin is warm and dry.  Psychiatric: She has a normal mood and affect. Her behavior is normal.  Vitals reviewed.   RECENT LABS AND TESTS: BMET    Component Value Date/Time   NA 141 09/13/2018 1125   K 4.6 09/13/2018 1125   CL 103 09/13/2018 1125   CO2 24 09/13/2018 1125   GLUCOSE 77 09/13/2018 1125   GLUCOSE 85 09/26/2012 0414   BUN 15 09/13/2018 1125   CREATININE 0.80 09/13/2018 1125   CALCIUM 9.3 09/13/2018 1125   GFRNONAA 83 09/13/2018 1125   GFRAA 96 09/13/2018 1125   Lab Results  Component Value Date   HGBA1C 5.1 09/13/2018   HGBA1C 5.2 01/08/2018   HGBA1C 5.1 07/14/2017   Lab Results  Component Value Date   INSULIN 5.3 09/13/2018   INSULIN 5.6 01/08/2018   INSULIN 6.0 07/14/2017   CBC    Component Value Date/Time   WBC 7.2 01/08/2018 0818   WBC 9.7 09/26/2012 0414   RBC 4.90 01/08/2018 0818   RBC 4.19 09/26/2012 0414   HGB 13.5 01/08/2018 0818   HCT 41.8 01/08/2018 0818   PLT 228 09/26/2012 0414   MCV 85 01/08/2018 0818   MCH 27.6 01/08/2018 0818   MCH 29.6 09/26/2012 0414   MCHC 32.3 01/08/2018 0818   MCHC 32.7 09/26/2012 0414   RDW 15.9 (H) 01/08/2018 0818   LYMPHSABS 2.2 01/08/2018 0818    EOSABS 0.1 01/08/2018 0818   BASOSABS 0.1 01/08/2018 0818   Iron/TIBC/Ferritin/ %Sat No results found for: IRON, TIBC, FERRITIN, IRONPCTSAT Lipid Panel     Component Value Date/Time   CHOL 217 (H) 09/13/2018 1125   TRIG 83  09/13/2018 1125   HDL 55 09/13/2018 1125   LDLCALC 145 (H) 09/13/2018 1125   Hepatic Function Panel     Component Value Date/Time   PROT 6.5 09/13/2018 1125   ALBUMIN 3.9 09/13/2018 1125   AST 14 09/13/2018 1125   ALT 16 09/13/2018 1125   ALKPHOS 73 09/13/2018 1125   BILITOT 0.4 09/13/2018 1125      Component Value Date/Time   TSH 2.500 01/08/2018 0818   TSH 2.560 07/14/2017 0935   Results for Nancie, Bocanegra Laylani J (MRN [id number]) as of 10/03/2018 09:03  Ref. Range 09/13/2018 11:25  Vitamin D, 25-Hydroxy Latest Ref Range: 30.0 - 100.0 ng/mL 45.5    ASSESSMENT AND PLAN: Vitamin D deficiency - Plan: Vitamin D, Ergocalciferol, (DRISDOL) 50000 units CAPS capsule  At risk for osteoporosis  Class 2 severe obesity with serious comorbidity and body mass index (BMI) of 39.0 to 39.9 in adult, unspecified obesity type (HCC)  PLAN:  Vitamin D Deficiency Jenalyn was informed that low vitamin D levels contributes to fatigue and are associated with obesity, breast, and colon cancer. She agrees to continue to take prescription Vit D @50 ,000 IU every week #4 with no refills and will follow up for routine testing of vitamin D, at least 2-3 times per year. She was informed of the risk of over-replacement of vitamin D and agrees to not increase her dose unless she discusses this with [place] first. Thena agrees to follow up as directed in 4 weeks.  At risk for osteopenia and osteoporosis Ninamarie was given extended (15 minutes) osteoporosis prevention counseling today. Valentine is at risk for osteopenia and osteoporosis due to her vitamin D deficiency. She was encouraged to take her vitamin D and follow her higher calcium diet and increase strengthening exercise to help strengthen her bones and  decrease her risk of osteopenia and osteoporosis.  Obesity Tikia is currently in the action stage of change. As such, her goal is to continue with weight loss efforts. She has agreed to keep a food journal with 1500 calories and 90 grams of protein.  Zyan has been instructed to work up to a goal of 150 minutes of combined cardio and strengthening exercise per week for weight loss and overall health benefits. We discussed the following Behavioral Modification Strategies today: decreasing simple carbohydrates, increase H2O intake, and decrease liquid calories.   Talor has agreed to follow up with our clinic in 4 weeks. She was informed of the importance of frequent follow up visits to maximize her success with intensive lifestyle modifications for her multiple health conditions.   OBESITY BEHAVIORAL INTERVENTION VISIT  Today's visit was # 16   Starting weight: 248 lbs Starting date: 07/14/17 Today's weight : Weight: 246 lb (111.6 kg)  Today's date: 10/02/2018 Total lbs lost to date: 2  ASK: We discussed the diagnosis of obesity with Undra Jarome Matin today and Catriona agreed to give [place] permission to discuss obesity behavioral modification therapy today.  ASSESS: Becky has the diagnosis of obesity and her BMI today is 39.72. Joline is in the action stage of change.   ADVISE: Antwonette was educated on the multiple health risks of obesity as well as the benefit of weight loss to improve her health. She was advised of the need for long term treatment and the importance of lifestyle modifications to improve her current health and to decrease her risk of future health problems.  AGREE: Multiple dietary modification options and treatment options were discussed and Willistine agreed to follow the  recommendations documented in the above note.  ARRANGE: Providence was educated on the importance of frequent visits to treat obesity as outlined per CMS and USPSTF guidelines and agreed to schedule her next follow up appointment  today.  I, Kirke Corin, am acting as transcriptionist for Wilder Glade, MD  I have reviewed the above documentation for accuracy and completeness, and I agree with the above. -Quillian Quince, MD

## 2018-10-10 DIAGNOSIS — H18603 Keratoconus, unspecified, bilateral: Secondary | ICD-10-CM | POA: Diagnosis not present

## 2018-11-05 ENCOUNTER — Ambulatory Visit (INDEPENDENT_AMBULATORY_CARE_PROVIDER_SITE_OTHER): Payer: 59 | Admitting: Family Medicine

## 2018-11-05 VITALS — BP 144/82 | HR 59 | Temp 98.2°F | Ht 66.0 in | Wt 248.0 lb

## 2018-11-05 DIAGNOSIS — Z9189 Other specified personal risk factors, not elsewhere classified: Secondary | ICD-10-CM

## 2018-11-05 DIAGNOSIS — E66813 Obesity, class 3: Secondary | ICD-10-CM

## 2018-11-05 DIAGNOSIS — Z6841 Body Mass Index (BMI) 40.0 and over, adult: Secondary | ICD-10-CM | POA: Diagnosis not present

## 2018-11-05 DIAGNOSIS — Z1231 Encounter for screening mammogram for malignant neoplasm of breast: Secondary | ICD-10-CM | POA: Diagnosis not present

## 2018-11-05 DIAGNOSIS — E559 Vitamin D deficiency, unspecified: Secondary | ICD-10-CM

## 2018-11-05 MED ORDER — VITAMIN D (ERGOCALCIFEROL) 1.25 MG (50000 UNIT) PO CAPS: ORAL_CAPSULE | ORAL | 1 refills | Status: DC

## 2018-11-06 NOTE — Progress Notes (Signed)
Office: [phone]  /  Fax: [phone]   HPI:   Chief Complaint: OBESITY Susan Dorsey is here to discuss her progress with her obesity treatment plan. She is on the keep a food journal with 1500 calories and 90 grams of protein daily plan and is following her eating plan approximately 25 % of the time. She states she is exercising 0 minutes 0 times per week. Susan Dorsey continues to be off track with weight loss. She gained two pounds in the last month, but she gained eighteen pounds since her lowest weight five months ago. Overall she is at the same weight as when she started approximately fifteen months ago. Susan Dorsey is considering having a conversion from lap band to gastric sleeve. Her weight is 248 lb (112.5 kg) today and has had a weight gain of 2 pounds over a period of 5 weeks since her last visit. She has lost 0 lbs since starting treatment with [place].  Vitamin D deficiency Susan Dorsey has a diagnosis of vitamin D deficiency. She is stable on vit D and denies nausea, vomiting or muscle weakness.  At risk for osteopenia and osteoporosis Susan Dorsey is at higher risk of osteopenia and osteoporosis due to vitamin D deficiency.   ALLERGIES: Allergies  Allergen Reactions  . Aspirin Swelling    Lips only.  . Chocolate   . Gluten Meal     MEDICATIONS: Current Outpatient Medications on File Prior to Visit  Medication Sig Dispense Refill  . Biotin 1000 MCG CHEW Chew by mouth daily.    . cetirizine (ZYRTEC) 10 MG tablet Take 10 mg by mouth daily.     . DHA-EPA-Vitamin E (OMEGA-3 COMPLEX PO) Take by mouth daily.    . diphenhydrAMINE (BENADRYL) 25 MG tablet Take 25 mg by mouth at bedtime as needed.    Marland Kitchen FIBER ADULT GUMMIES 2 g CHEW Chew 2 tablets by mouth 2 (two) times daily.    . fluticasone (FLONASE) 50 MCG/ACT nasal spray Place 2 sprays into the nose daily.     . LOW-OGESTREL 0.3-30 MG-MCG tablet Take 1 tablet by mouth daily.     . Magnesium 250 MG TABS Take 1 tablet by mouth daily.    . Melatonin 5 MG CAPS Take  by mouth daily.    . montelukast (SINGULAIR) 10 MG tablet Take 10 mg by mouth at bedtime.    . Multiple Vitamin (MULTIVITAMIN WITH MINERALS) TABS Take 1 tablet by mouth daily.    Marland Kitchen omeprazole (PRILOSEC) 40 MG capsule Take 40 mg by mouth 2 (two) times daily.     . Plant Sterols and Stanols (CHOLEST OFF PO) Take 1 tablet 2 (two) times daily by mouth.     No current facility-administered medications on file prior to visit.     PAST MEDICAL HISTORY: Past Medical History:  Diagnosis Date  . Allergy   . Anemia   . B12 deficiency   . Chronic kidney disease    hx of uti   . Gallbladder problem   . GERD (gastroesophageal reflux disease)   . Gluten intolerance   . Gluten intolerance   . Hyperlipidemia    borderline  . Hypertension    Resolved after LAGB  . Joint pain   . Keratoconus   . Morbid obesity (HCC)   . Obesity   . Peeling of nails   . Swallowing difficulty   . Swelling    feet and legs  . TMJ (dislocation of temporomandibular joint)   . Vitamin D deficiency   .  Wears glasses   . White coat syndrome with diagnosis of hypertension     PAST SURGICAL HISTORY: Past Surgical History:  Procedure Laterality Date  . CHOLECYSTECTOMY  1990's   Patient unsure of date  . LAPAROSCOPIC GASTRIC BANDING  01/23/2007  . PANNICULECTOMY  09/24/2012   Procedure: PANNICULECTOMY;  Surgeon: Valarie Merino, MD;  Location: [facility];  Service: General;  Laterality: N/A;  . TONSILECTOMY, ADENOIDECTOMY, BILATERAL MYRINGOTOMY AND TUBES  1972    SOCIAL HISTORY: Social History   Tobacco Use  . Smoking status: Never Smoker  . Smokeless tobacco: Never Used  Substance Use Topics  . Alcohol use: No  . Drug use: No    FAMILY HISTORY: Family History  Problem Relation Age of Onset  . Hypertension Mother   . Arthritis Mother   . Diabetes Mother   . Hyperlipidemia Mother   . Heart disease Mother   . Depression Mother   . Anxiety disorder Mother   . Sleep apnea Mother   . Eating disorder  Mother   . Obstructive Sleep Apnea Mother   . Obesity Mother   . Cancer Father        lliver, pancreas, stomach  . Liver disease Father   . Chronic fatigue Sister     ROS: Review of Systems  Constitutional: Negative for weight loss.  Gastrointestinal: Negative for nausea and vomiting.  Musculoskeletal:       Negative for muscle weakness    PHYSICAL EXAM: Blood pressure (!) 144/82, pulse (!) 59, temperature 98.2 F (36.8 C), temperature source Oral, height 5\' 6"  (1.676 m), weight 248 lb (112.5 kg), SpO2 100 %. Body mass index is 40.03 kg/m. Physical Exam  Constitutional: She is oriented to person, place, and time. She appears well-developed and well-nourished.  Cardiovascular: Normal rate.  Pulmonary/Chest: Effort normal.  Musculoskeletal: Normal range of motion.  Neurological: She is oriented to person, place, and time.  Skin: Skin is warm and dry.  Psychiatric: She has a normal mood and affect. Her behavior is normal.  Vitals reviewed.   RECENT LABS AND TESTS: BMET    Component Value Date/Time   NA 141 09/13/2018 1125   K 4.6 09/13/2018 1125   CL 103 09/13/2018 1125   CO2 24 09/13/2018 1125   GLUCOSE 77 09/13/2018 1125   GLUCOSE 85 09/26/2012 0414   BUN 15 09/13/2018 1125   CREATININE 0.80 09/13/2018 1125   CALCIUM 9.3 09/13/2018 1125   GFRNONAA 83 09/13/2018 1125   GFRAA 96 09/13/2018 1125   Lab Results  Component Value Date   HGBA1C 5.1 09/13/2018   HGBA1C 5.2 01/08/2018   HGBA1C 5.1 07/14/2017   Lab Results  Component Value Date   INSULIN 5.3 09/13/2018   INSULIN 5.6 01/08/2018   INSULIN 6.0 07/14/2017   CBC    Component Value Date/Time   WBC 7.2 01/08/2018 0818   WBC 9.7 09/26/2012 0414   RBC 4.90 01/08/2018 0818   RBC 4.19 09/26/2012 0414   HGB 13.5 01/08/2018 0818   HCT 41.8 01/08/2018 0818   PLT 228 09/26/2012 0414   MCV 85 01/08/2018 0818   MCH 27.6 01/08/2018 0818   MCH 29.6 09/26/2012 0414   MCHC 32.3 01/08/2018 0818   MCHC 32.7  09/26/2012 0414   RDW 15.9 (H) 01/08/2018 0818   LYMPHSABS 2.2 01/08/2018 0818   EOSABS 0.1 01/08/2018 0818   BASOSABS 0.1 01/08/2018 0818   Iron/TIBC/Ferritin/ %Sat No results found for: IRON, TIBC, FERRITIN, IRONPCTSAT Lipid Panel  Component Value Date/Time   CHOL 217 (H) 09/13/2018 1125   TRIG 83 09/13/2018 1125   HDL 55 09/13/2018 1125   LDLCALC 145 (H) 09/13/2018 1125   Hepatic Function Panel     Component Value Date/Time   PROT 6.5 09/13/2018 1125   ALBUMIN 3.9 09/13/2018 1125   AST 14 09/13/2018 1125   ALT 16 09/13/2018 1125   ALKPHOS 73 09/13/2018 1125   BILITOT 0.4 09/13/2018 1125      Component Value Date/Time   TSH 2.500 01/08/2018 0818   TSH 2.560 07/14/2017 0935   Results for Susan Dorsey, Susan Dorsey (MRN [id number]) as of 11/06/2018 08:26  Ref. Range 09/13/2018 11:25  Vitamin D, 25-Hydroxy Latest Ref Range: 30.0 - 100.0 ng/mL 45.5   ASSESSMENT AND PLAN: Vitamin D deficiency - Plan: Vitamin D, Ergocalciferol, (DRISDOL) 1.25 MG (50000 UT) CAPS capsule  At risk for osteoporosis  Class 3 severe obesity with serious comorbidity and body mass index (BMI) of 40.0 to 44.9 in adult, unspecified obesity type (HCC)  PLAN:  Vitamin D Deficiency Lajuan was informed that low vitamin D levels contributes to fatigue and are associated with obesity, breast, and colon cancer. She agrees to continue to take prescription Vit D @50 ,000 IU every 14 days #2 with 1 refill and will follow up for routine testing of vitamin D, at least 2-3 times per year. She was informed of the risk of over-replacement of vitamin D and agrees to not increase her dose unless she discusses this with [place] first. Lamoine agrees to follow up as directed.  At risk for osteopenia and osteoporosis Annely was given extended  (15 minutes) osteoporosis prevention counseling today. Susan Dorsey is at risk for osteopenia and osteoporosis due to her vitamin D deficiency. She was encouraged to take her vitamin D and follow her higher  calcium diet and increase strengthening exercise to help strengthen her bones and decrease her risk of osteopenia and osteoporosis.  Obesity Susan Dorsey is currently in the action stage of change. As such, her goal is to work on maintaining weight over the holidays and reassess readiness to change at that time. She has agreed to portion control better and make smarter food choices, such as increase vegetables and decrease simple carbohydrates  Susan Dorsey has been instructed to work up to a goal of 150 minutes of combined cardio and strengthening exercise per week for weight loss and overall health benefits.  Susan Dorsey has agreed to follow up with our clinic in 8 weeks. She was informed of the importance of frequent follow up visits to maximize her success with intensive lifestyle modifications for her multiple health conditions.   OBESITY BEHAVIORAL INTERVENTION VISIT  Today's visit was # 17   Starting weight: 248 lbs Starting date: 07/14/2017 Today's weight : 248 lbs Today's date: 11/05/2018 Total lbs lost to date: 0   ASK: We discussed the diagnosis of obesity with Susan Dorsey today and Irmgard agreed to give [place] permission to discuss obesity behavioral modification therapy today.  ASSESS: Chrysta has the diagnosis of obesity and her BMI today is 40.05 Meredeth is not in the action stage of change   ADVISE: Susan Dorsey was educated on the multiple health risks of obesity as well as the benefit of weight loss to improve her health. She was advised of the need for long term treatment and the importance of lifestyle modifications to improve her current health and to decrease her risk of future health problems.  AGREE: Multiple dietary modification options and treatment options were  discussed and  Susan Dorsey agreed to follow the recommendations documented in the above note.  ARRANGE: Susan Dorsey was educated on the importance of frequent visits to treat obesity as outlined per CMS and USPSTF guidelines and agreed to schedule her next  follow up appointment today.  I, Nevada Crane, am acting as transcriptionist for Quillian Quince, MD  I have reviewed the above documentation for accuracy and completeness, and I agree with the above. -Quillian Quince, MD

## 2018-11-09 DIAGNOSIS — Z4651 Encounter for fitting and adjustment of gastric lap band: Secondary | ICD-10-CM | POA: Diagnosis not present

## 2018-11-28 DIAGNOSIS — Z4651 Encounter for fitting and adjustment of gastric lap band: Secondary | ICD-10-CM | POA: Diagnosis not present

## 2018-12-03 DIAGNOSIS — E559 Vitamin D deficiency, unspecified: Secondary | ICD-10-CM | POA: Diagnosis not present

## 2018-12-10 DIAGNOSIS — Z0000 Encounter for general adult medical examination without abnormal findings: Secondary | ICD-10-CM | POA: Diagnosis not present

## 2018-12-10 DIAGNOSIS — Z6841 Body Mass Index (BMI) 40.0 and over, adult: Secondary | ICD-10-CM | POA: Diagnosis not present

## 2018-12-10 DIAGNOSIS — Z9884 Bariatric surgery status: Secondary | ICD-10-CM | POA: Diagnosis not present

## 2018-12-10 DIAGNOSIS — H18609 Keratoconus, unspecified, unspecified eye: Secondary | ICD-10-CM | POA: Diagnosis not present

## 2018-12-31 ENCOUNTER — Ambulatory Visit (INDEPENDENT_AMBULATORY_CARE_PROVIDER_SITE_OTHER): Payer: 59 | Admitting: Family Medicine

## 2018-12-31 ENCOUNTER — Encounter (INDEPENDENT_AMBULATORY_CARE_PROVIDER_SITE_OTHER): Payer: Self-pay | Admitting: Family Medicine

## 2018-12-31 VITALS — BP 143/85 | HR 61 | Temp 98.0°F | Ht 66.0 in | Wt 258.0 lb

## 2018-12-31 DIAGNOSIS — E559 Vitamin D deficiency, unspecified: Secondary | ICD-10-CM | POA: Diagnosis not present

## 2018-12-31 DIAGNOSIS — R030 Elevated blood-pressure reading, without diagnosis of hypertension: Secondary | ICD-10-CM | POA: Diagnosis not present

## 2018-12-31 DIAGNOSIS — Z9189 Other specified personal risk factors, not elsewhere classified: Secondary | ICD-10-CM | POA: Diagnosis not present

## 2018-12-31 DIAGNOSIS — Z9889 Other specified postprocedural states: Secondary | ICD-10-CM

## 2018-12-31 DIAGNOSIS — Z6841 Body Mass Index (BMI) 40.0 and over, adult: Secondary | ICD-10-CM

## 2018-12-31 MED ORDER — VITAMIN D (ERGOCALCIFEROL) 1.25 MG (50000 UNIT) PO CAPS: ORAL_CAPSULE | ORAL | 1 refills | Status: DC

## 2018-12-31 NOTE — Progress Notes (Signed)
Office: [phone]  /  Fax: [phone]   HPI:   Chief Complaint: OBESITY Susan Dorsey is here to discuss her progress with her obesity treatment plan. She is on the portion control better and make smarter food choices, such as increase vegetables and decrease simple carbohydrates and is following her eating plan approximately 50 % of the time. She states she is exercising 0 minutes 0 times per week. Susan Dorsey has gained 10 pounds in the last month. She denies concentrating on weight loss over the holidays, but is ready to get back on track. Susan Dorsey is status post lap band surgery. Her lap band was unfilled last year and is now filled at 7.5.  Her weight is 258 lb (117 kg) today and has had a weight gain of 10 pounds over a period of 8 weeks since her last visit. She has lost 0 lbs since starting treatment with us.  Vitamin D deficiency Susan Dorsey has a diagnosis of vitamin D deficiency. She is currently stable on vit D and her last level was in the 40's. She denies nausea, vomiting, or muscle weakness.  At risk for osteopenia and osteoporosis Susan Dorsey is at higher risk of osteopenia and osteoporosis due to vitamin D deficiency.   Elevated Blood Pressure Davan does not have a history of hypertension. Her blood pressure is decreased at home. She feels this is due to her weight gain and stress about weighing in. She denies chest pain and headache.  ASSESSMENT AND PLAN:  Vitamin D deficiency - Plan: Vitamin D, Ergocalciferol, (DRISDOL) 1.25 MG (50000 UT) CAPS capsule  Status post gastric surgery  Blood pressure elevated without history of HTN  At risk for osteoporosis  Class 3 severe obesity with serious comorbidity and body mass index (BMI) of 40.0 to 44.9 in adult, unspecified obesity type (HCC)  PLAN:  Vitamin D Deficiency Susan Dorsey was informed that low vitamin D levels contributes to fatigue and are associated with obesity, breast, and colon cancer. She agrees to continue to take prescription Vit D @50 ,000 IU  every week #4 with no refills and will follow up for routine testing of vitamin D, at least 2-3 times per year. She was informed of the risk of over-replacement of vitamin D and agrees to not increase her dose unless she discusses this with us first. Susan Dorsey agrees to follow up in 4 weeks.  At risk for osteopenia and osteoporosis Susan Dorsey was given extended (15 minutes) osteoporosis prevention counseling today. Susan Dorsey is at risk for osteopenia and osteoporosis due to her vitamin D deficiency. She was encouraged to take her vitamin D and follow her higher calcium diet and increase strengthening exercise to help strengthen her bones and decrease her risk of osteopenia and osteoporosis.  Elevated Blood Pressure Susan Dorsey agrees to continue with her diet and weight loss. We will recheck her blood pressure at her next office visit in 4 weeks. Susan Dorsey agrees with this plan.  Obesity Susan Dorsey is currently in the action stage of change. As such, her goal is to continue with weight loss efforts. She has agreed to keep a food journal with 1500 calories and 80+ grams of protein.  Susan Dorsey has been instructed to work up to a goal of 150 minutes of combined cardio and strengthening exercise per week for weight loss and overall health benefits. We discussed the following Behavioral Modification Strategies today: increasing lean protein intake, decreasing simple carbohydrates, increasing fiber rich foods, increase H2O intake, keeping a strict food journal and decreasing sodium intake.  Susan Dorsey has  agreed to follow up with our clinic in 4 weeks. She was informed of the importance of frequent follow up visits to maximize her success with intensive lifestyle modifications for her multiple health conditions.  ALLERGIES: Allergies  Allergen Reactions  . Aspirin Swelling    Lips only.  . Chocolate   . Gluten Meal     MEDICATIONS: Current Outpatient Medications on File Prior to Visit  Medication Sig Dispense Refill  . b complex vitamins capsule  Take 1 capsule by mouth daily.    . Biotin 1000 MCG CHEW Chew by mouth daily.    . cetirizine (ZYRTEC) 10 MG tablet Take 10 mg by mouth daily.     . DHA-EPA-Vitamin E (OMEGA-3 COMPLEX PO) Take by mouth daily.    . diphenhydrAMINE (BENADRYL) 25 MG tablet Take 25 mg by mouth at bedtime as needed.    . ferrous sulfate 325 (65 FE) MG tablet Take 325 mg by mouth daily with breakfast.    . FIBER ADULT GUMMIES 2 g CHEW Chew 2 tablets by mouth 2 (two) times daily.    . fluticasone (FLONASE) 50 MCG/ACT nasal spray Place 2 sprays into the nose daily.     . LOW-OGESTREL 0.3-30 MG-MCG tablet Take 1 tablet by mouth daily.     . Magnesium 250 MG TABS Take 1 tablet by mouth daily.    . Melatonin 5 MG CAPS Take by mouth daily.    . montelukast (SINGULAIR) 10 MG tablet Take 10 mg by mouth at bedtime.    . Multiple Vitamin (MULTIVITAMIN WITH MINERALS) TABS Take 1 tablet by mouth daily.    Marland Kitchen omeprazole (PRILOSEC) 40 MG capsule Take 40 mg by mouth 2 (two) times daily.     . Plant Sterols and Stanols (CHOLEST OFF PO) Take 1 tablet 2 (two) times daily by mouth.    . vitamin C (ASCORBIC ACID) 500 MG tablet Take 500 mg by mouth daily.    . Vitamin D, Ergocalciferol, (DRISDOL) 1.25 MG (50000 UT) CAPS capsule Take one tab every 14 days 2 capsule 1   No current facility-administered medications on file prior to visit.     PAST MEDICAL HISTORY: Past Medical History:  Diagnosis Date  . Allergy   . Anemia   . B12 deficiency   . Chronic kidney disease    hx of uti   . Gallbladder problem   . GERD (gastroesophageal reflux disease)   . Gluten intolerance   . Gluten intolerance   . Hyperlipidemia    borderline  . Hypertension    Resolved after LAGB  . Joint pain   . Keratoconus   . Morbid obesity (HCC)   . Obesity   . Peeling of nails   . Swallowing difficulty   . Swelling    feet and legs  . TMJ (dislocation of temporomandibular joint)   . Vitamin D deficiency   . Wears glasses   . White coat syndrome  with diagnosis of hypertension     PAST SURGICAL HISTORY: Past Surgical History:  Procedure Laterality Date  . CHOLECYSTECTOMY  1990's   Patient unsure of date  . LAPAROSCOPIC GASTRIC BANDING  01/23/2007  . PANNICULECTOMY  09/24/2012   Procedure: PANNICULECTOMY;  Surgeon: Valarie Merino, MD;  Location: [facility];  Service: General;  Laterality: N/A;  . TONSILECTOMY, ADENOIDECTOMY, BILATERAL MYRINGOTOMY AND TUBES  1972    SOCIAL HISTORY: Social History   Tobacco Use  . Smoking status: Never Smoker  . Smokeless tobacco: Never Used  Substance Use Topics  . Alcohol use: No  . Drug use: No    FAMILY HISTORY: Family History  Problem Relation Age of Onset  . Hypertension Mother   . Arthritis Mother   . Diabetes Mother   . Hyperlipidemia Mother   . Heart disease Mother   . Depression Mother   . Anxiety disorder Mother   . Sleep apnea Mother   . Eating disorder Mother   . Obstructive Sleep Apnea Mother   . Obesity Mother   . Cancer Father        lliver, pancreas, stomach  . Liver disease Father   . Chronic fatigue Sister    ROS: Review of Systems  Constitutional: Negative for weight loss.  Cardiovascular: Negative for chest pain.  Gastrointestinal: Negative for nausea and vomiting.  Musculoskeletal:       Negative for muscle weakness.  Neurological: Negative for headaches.   PHYSICAL EXAM: Blood pressure (!) 143/85, pulse 61, temperature 98 F (36.7 C), temperature source Oral, height 5\' 6"  (1.676 m), weight 258 lb (117 kg), SpO2 99 %. Body mass index is 41.64 kg/m. Physical Exam Vitals signs reviewed.  Constitutional:      Appearance: Normal appearance. She is obese.  Cardiovascular:     Rate and Rhythm: Normal rate.  Pulmonary:     Effort: Pulmonary effort is normal.  Musculoskeletal: Normal range of motion.  Skin:    General: Skin is warm and dry.  Neurological:     Mental Status: She is alert and oriented to person, place, and time.  Psychiatric:         Mood and Affect: Mood normal.        Behavior: Behavior normal.    RECENT LABS AND TESTS: BMET    Component Value Date/Time   NA 141 09/13/2018 1125   K 4.6 09/13/2018 1125   CL 103 09/13/2018 1125   CO2 24 09/13/2018 1125   GLUCOSE 77 09/13/2018 1125   GLUCOSE 85 09/26/2012 0414   BUN 15 09/13/2018 1125   CREATININE 0.80 09/13/2018 1125   CALCIUM 9.3 09/13/2018 1125   GFRNONAA 83 09/13/2018 1125   GFRAA 96 09/13/2018 1125   Lab Results  Component Value Date   HGBA1C 5.1 09/13/2018   HGBA1C 5.2 01/08/2018   HGBA1C 5.1 07/14/2017   Lab Results  Component Value Date   INSULIN 5.3 09/13/2018   INSULIN 5.6 01/08/2018   INSULIN 6.0 07/14/2017   CBC    Component Value Date/Time   WBC 7.2 01/08/2018 0818   WBC 9.7 09/26/2012 0414   RBC 4.90 01/08/2018 0818   RBC 4.19 09/26/2012 0414   HGB 13.5 01/08/2018 0818   HCT 41.8 01/08/2018 0818   PLT 228 09/26/2012 0414   MCV 85 01/08/2018 0818   MCH 27.6 01/08/2018 0818   MCH 29.6 09/26/2012 0414   MCHC 32.3 01/08/2018 0818   MCHC 32.7 09/26/2012 0414   RDW 15.9 (H) 01/08/2018 0818   LYMPHSABS 2.2 01/08/2018 0818   EOSABS 0.1 01/08/2018 0818   BASOSABS 0.1 01/08/2018 0818   Iron/TIBC/Ferritin/ %Sat No results found for: IRON, TIBC, FERRITIN, IRONPCTSAT Lipid Panel     Component Value Date/Time   CHOL 217 (H) 09/13/2018 1125   TRIG 83 09/13/2018 1125   HDL 55 09/13/2018 1125   LDLCALC 145 (H) 09/13/2018 1125   Hepatic Function Panel     Component Value Date/Time   PROT 6.5 09/13/2018 1125   ALBUMIN 3.9 09/13/2018 1125   AST 14 09/13/2018  1125   ALT 16 09/13/2018 1125   ALKPHOS 73 09/13/2018 1125   BILITOT 0.4 09/13/2018 1125      Component Value Date/Time   TSH 2.500 01/08/2018 0818   TSH 2.560 07/14/2017 0935   Results for MARICELA, SCHREUR (MRN [id number]) as of 12/31/2018 08:38  Ref. Range 09/13/2018 11:25  Vitamin D, 25-Hydroxy Latest Ref Range: 30.0 - 100.0 ng/mL 45.5    OBESITY BEHAVIORAL INTERVENTION  VISIT  Today's visit was # 18   Starting weight: 248 lbs Starting date: 07/14/17 Today's weight : Weight: 258 lb (117 kg)  Today's date: 12/31/2018 Total lbs lost to date: 0  ASK: We discussed the diagnosis of obesity with Susan Dorsey today and Susan Dorsey agreed to give [place] permission to discuss obesity behavioral modification therapy today.  ASSESS: Laini has the diagnosis of obesity and her BMI today is 41.6. Sherby is in the action stage of change.   ADVISE: Susan Dorsey was educated on the multiple health risks of obesity as well as the benefit of weight loss to improve her health. She was advised of the need for long term treatment and the importance of lifestyle modifications to improve her current health and to decrease her risk of future health problems.  AGREE: Multiple dietary modification options and treatment options were discussed and Susan Dorsey agreed to follow the recommendations documented in the above note.  ARRANGE: Susan Dorsey was educated on the importance of frequent visits to treat obesity as outlined per CMS and USPSTF guidelines and agreed to schedule her next follow up appointment today.  I, Kirke Corin, am acting as transcriptionist for Wilder Glade, MD I have reviewed the above documentation for accuracy and completeness, and I agree with the above. -Quillian Quince, MD

## 2019-01-01 DIAGNOSIS — Z9889 Other specified postprocedural states: Secondary | ICD-10-CM | POA: Insufficient documentation

## 2019-01-02 DIAGNOSIS — Z9889 Other specified postprocedural states: Secondary | ICD-10-CM | POA: Diagnosis not present

## 2019-01-02 DIAGNOSIS — Z4651 Encounter for fitting and adjustment of gastric lap band: Secondary | ICD-10-CM | POA: Diagnosis not present

## 2019-01-08 ENCOUNTER — Other Ambulatory Visit: Payer: Self-pay | Admitting: Surgery

## 2019-01-08 DIAGNOSIS — K219 Gastro-esophageal reflux disease without esophagitis: Secondary | ICD-10-CM

## 2019-01-11 ENCOUNTER — Other Ambulatory Visit: Payer: Self-pay | Admitting: Surgery

## 2019-01-11 ENCOUNTER — Ambulatory Visit
Admission: RE | Admit: 2019-01-11 | Discharge: 2019-01-11 | Disposition: A | Discharge status: Home / Self Care | Payer: 59 | Source: Ambulatory Visit | Attending: Surgery | Admitting: Surgery

## 2019-01-11 DIAGNOSIS — K219 Gastro-esophageal reflux disease without esophagitis: Secondary | ICD-10-CM

## 2019-01-16 DIAGNOSIS — K219 Gastro-esophageal reflux disease without esophagitis: Secondary | ICD-10-CM | POA: Diagnosis not present

## 2019-01-24 ENCOUNTER — Encounter (INDEPENDENT_AMBULATORY_CARE_PROVIDER_SITE_OTHER): Payer: Self-pay

## 2019-01-24 ENCOUNTER — Ambulatory Visit (INDEPENDENT_AMBULATORY_CARE_PROVIDER_SITE_OTHER): Payer: 59 | Admitting: Family Medicine

## 2019-02-11 ENCOUNTER — Encounter: Payer: 59 | Attending: General Surgery | Admitting: Skilled Nursing Facility1

## 2019-02-11 DIAGNOSIS — E669 Obesity, unspecified: Secondary | ICD-10-CM | POA: Diagnosis present

## 2019-02-12 NOTE — Progress Notes (Signed)
Pre-Operative Nutrition Class:  Appt start time: 1460   End time:  1830.  Patient was seen on 02/12/2019 for Pre-Operative Bariatric Surgery Education at [facility].   Surgery date:  Surgery type: Sleeve Start weight at [facility]:  Weight today: 271.7   Samples given per MNT protocol. Patient educated on appropriate usage: Bariatric Advantage Multivitamin Lot # [id number] Exp: 04/21  Bariatric Advantage Calcium  Lot #  [id number] Exp:  Dec 10 2019  Unjury Protein Shake Lot # [id number] Exp: April 01 2019  The following the learning objectives were met by the patient during this course:  Identify Pre-Op Dietary Goals and will begin 2 weeks pre-operatively  Identify appropriate sources of fluids and proteins   State protein recommendations and appropriate sources pre and post-operatively  Identify Post-Operative Dietary Goals and will follow for 2 weeks post-operatively  Identify appropriate multivitamin and calcium sources  Describe the need for physical activity post-operatively and will follow MD recommendations  State when to call healthcare provider regarding medication questions or post-operative complications  Handouts given during class include:  Pre-Op Bariatric Surgery Diet Handout  Protein Shake Handout  Post-Op Bariatric Surgery Nutrition Handout  [facility] Program Information Flyer  Support Group Information Flyer  [facility] Bariatric Supplements Price List  Follow-Up Plan: Patient will follow-up at [facility] 2 weeks post operatively for diet advancement per MD.

## 2019-02-26 NOTE — Patient Instructions (Addendum)
Susanne CHERREE CONERLY  02/26/2019   Your procedure is scheduled on:  03-04-19    Report to [facility] Main  Entrance               Report to admitting at       1140 AM    Call this number if you have problems the morning of surgery [phone]    Remember:NO SOLID FOOD AFTER MIDNIGHT THE NIGHT PRIOR TO SURGERY. NOTHING BY MOUTH EXCEPT CLEAR LIQUIDS UNTIL 3 HOURS PRIOR TO SCHEULED SURGERY.   PLEASE FINISH ENSURE DRINK PER SURGEON ORDER 3 HOURS PRIOR TO SCHEDULED SURGERY TIME WHICH NEEDS TO BE COMPLETED AT ____1040  am then nothing by mouth________.   CLEAR LIQUID DIET   Foods Allowed                                                                     Foods Excluded  Coffee and tea, regular and decaf                             liquids that you cannot  Plain Jell-O in any flavor                                             see through such as: Fruit ices (not with fruit pulp)                                     milk, soups, orange juice  Iced Popsicles                                    All solid food Carbonated beverages, regular and diet                                    Cranberry, grape and apple juices Sports drinks like Gatorade Lightly seasoned clear broth or consume(fat free) Sugar, honey syrup  _____________________________________________________________________    BRUSH YOUR TEETH MORNING OF SURGERY AND RINSE YOUR MOUTH OUT, NO CHEWING GUM CANDY OR MINTS.     Take these medicines the morning of surgery with A SIP OF WATER: eye drops  If needed , omeprazole, singulair, flonase, zyrtec                               You may not have any metal on your body including hair pins and              piercings  Do not wear jewelry, make-up, lotions, powders or perfumes, deodorant             Do not wear nail polish.  Do not shave  48 hours prior to surgery.  Do not bring valuables to the hospital. [facility] IS NOT             RESPONSIBLE   FOR  VALUABLES.  Contacts, dentures or bridgework may not be worn into surgery.  Leave suitcase in the car. After surgery it may be brought to your room.                  Please read over the following fact sheets you were given: _____________________________________________________________________           [facility] - Preparing for Surgery Before surgery, you can play an important role.  Because skin is not sterile, your skin needs to be as free of germs as possible.  You can reduce the number of germs on your skin by washing with CHG (chlorahexidine gluconate) soap before surgery.  CHG is an antiseptic cleaner which kills germs and bonds with the skin to continue killing germs even after washing. Please DO NOT use if you have an allergy to CHG or antibacterial soaps.  If your skin becomes reddened/irritated stop using the CHG and inform your nurse when you arrive at Short Stay. Do not shave (including legs and underarms) for at least 48 hours prior to the first CHG shower.  You may shave your face/neck. Please follow these instructions carefully:  1.  Shower with CHG Soap the night before surgery and the  morning of Surgery.  2.  If you choose to wash your hair, wash your hair first as usual with your  normal  shampoo.  3.  After you shampoo, rinse your hair and body thoroughly to remove the  shampoo.                           4.  Use CHG as you would any other liquid soap.  You can apply chg directly  to the skin and wash                       Gently with a scrungie or clean washcloth.  5.  Apply the CHG Soap to your body ONLY FROM THE NECK DOWN.   Do not use on face/ open                           Wound or open sores. Avoid contact with eyes, ears mouth and genitals (private parts).                       Wash face,  Genitals (private parts) with your normal soap.             6.  Wash thoroughly, paying special attention to the area where your surgery  will be performed.  7.  Thoroughly rinse  your body with warm water from the neck down.  8.  DO NOT shower/wash with your normal soap after using and rinsing off  the CHG Soap.                9.  Pat yourself dry with a clean towel.            10.  Wear clean pajamas.            11.  Place clean sheets on your bed the night of your first shower and do not  sleep with pets. Day of Surgery : Do not apply any  lotions/deodorants the morning of surgery.  Please wear clean clothes to the hospital/surgery center.  FAILURE TO FOLLOW THESE INSTRUCTIONS MAY RESULT IN THE CANCELLATION OF YOUR SURGERY PATIENT SIGNATURE_________________________________  NURSE SIGNATURE__________________________________  ________________________________________________________________________   Rogelia Mire  An incentive spirometer is a tool that can help keep your lungs clear and active. This tool measures how well you are filling your lungs with each breath. Taking long deep breaths may help reverse or decrease the chance of developing breathing (pulmonary) problems (especially infection) following:  A long period of time when you are unable to move or be active. BEFORE THE PROCEDURE   If the spirometer includes an indicator to show your best effort, your nurse or respiratory therapist will set it to a desired goal.  If possible, sit up straight or lean slightly forward. Try not to slouch.  Hold the incentive spirometer in an upright position. INSTRUCTIONS FOR USE  1. Sit on the edge of your bed if possible, or sit up as far as you can in bed or on a chair. 2. Hold the incentive spirometer in an upright position. 3. Breathe out normally. 4. Place the mouthpiece in your mouth and seal your lips tightly around it. 5. Breathe in slowly and as deeply as possible, raising the piston or the ball toward the top of the column. 6. Hold your breath for 3-5 seconds or for as long as possible. Allow the piston or ball to fall to the bottom of the  column. 7. Remove the mouthpiece from your mouth and breathe out normally. 8. Rest for a few seconds and repeat Steps 1 through 7 at least 10 times every 1-2 hours when you are awake. Take your time and take a few normal breaths between deep breaths. 9. The spirometer may include an indicator to show your best effort. Use the indicator as a goal to work toward during each repetition. 10. After each set of 10 deep breaths, practice coughing to be sure your lungs are clear. If you have an incision (the cut made at the time of surgery), support your incision when coughing by placing a pillow or rolled up towels firmly against it. Once you are able to get out of bed, walk around indoors and cough well. You may stop using the incentive spirometer when instructed by your caregiver.  RISKS AND COMPLICATIONS  Take your time so you do not get dizzy or light-headed.  If you are in pain, you may need to take or ask for pain medication before doing incentive spirometry. It is harder to take a deep breath if you are having pain. AFTER USE  Rest and breathe slowly and easily.  It can be helpful to keep track of a log of your progress. Your caregiver can provide you with a simple table to help with this. If you are using the spirometer at home, follow these instructions: SEEK MEDICAL CARE IF:   You are having difficultly using the spirometer.  You have trouble using the spirometer as often as instructed.  Your pain medication is not giving enough relief while using the spirometer.  You develop fever of 100.5 F (38.1 C) or higher. SEEK IMMEDIATE MEDICAL CARE IF:   You cough up bloody sputum that had not been present before.  You develop fever of 102 F (38.9 C) or greater.  You develop worsening pain at or near the incision site. MAKE SURE YOU:   Understand these instructions.  Will watch your condition.  Will get help  right away if you are not doing well or get worse. Document Released:  04/24/2007 Document Revised: 03/05/2012 Document Reviewed: 06/25/2007 [facility] Patient Information 2014 [place], [place].   ________________________________________________________________________

## 2019-02-26 NOTE — Progress Notes (Signed)
Please Place orders in epic pt. Has a preop 02-27-19 Thank You!

## 2019-02-27 ENCOUNTER — Encounter (HOSPITAL_COMMUNITY)
Admission: RE | Admit: 2019-02-27 | Discharge: 2019-02-27 | Disposition: A | Discharge status: Home / Self Care | Payer: 59 | Source: Ambulatory Visit | Attending: Surgery | Admitting: Surgery

## 2019-02-27 ENCOUNTER — Encounter (HOSPITAL_COMMUNITY): Payer: Self-pay

## 2019-02-27 ENCOUNTER — Other Ambulatory Visit: Payer: Self-pay

## 2019-02-27 DIAGNOSIS — Z01818 Encounter for other preprocedural examination: Secondary | ICD-10-CM | POA: Diagnosis not present

## 2019-02-27 DIAGNOSIS — I444 Left anterior fascicular block: Secondary | ICD-10-CM | POA: Diagnosis not present

## 2019-02-27 DIAGNOSIS — R9431 Abnormal electrocardiogram [ECG] [EKG]: Secondary | ICD-10-CM | POA: Diagnosis not present

## 2019-02-27 HISTORY — DX: Pneumonia, unspecified organism: J18.9

## 2019-02-27 LAB — COMPREHENSIVE METABOLIC PANEL
ALT: 19 U/L (ref 0–44)
AST: 24 U/L (ref 15–41)
Albumin: 4.4 g/dL (ref 3.5–5.0)
Alkaline Phosphatase: 60 U/L (ref 38–126)
Anion gap: 8 (ref 5–15)
BUN: 16 mg/dL (ref 6–20)
CO2: 26 mmol/L (ref 22–32)
Calcium: 9.7 mg/dL (ref 8.9–10.3)
Chloride: 105 mmol/L (ref 98–111)
Creatinine, Ser: 0.76 mg/dL (ref 0.44–1.00)
GFR calc Af Amer: >60 mL/min (ref >60)
GFR calc non Af Amer: >60 mL/min (ref >60)
Glucose, Bld: 89 mg/dL (ref 70–99)
Potassium: 4.4 mmol/L (ref 3.5–5.1)
Sodium: 139 mmol/L (ref 135–145)
Total Bilirubin: 0.7 mg/dL (ref 0.3–1.2)
Total Protein: 7.7 g/dL (ref 6.5–8.1)

## 2019-02-27 LAB — CBC WITH DIFFERENTIAL/PLATELET
Abs Immature Granulocytes: 0.03 10*3/uL (ref 0.00–0.07)
Basophils Absolute: 0 10*3/uL (ref 0.0–0.1)
Basophils Relative: 1 %
Eosinophils Absolute: 0.1 10*3/uL (ref 0.0–0.5)
Eosinophils Relative: 1 %
HCT: 50 % — ABNORMAL HIGH (ref 36.0–46.0)
Hemoglobin: 15.4 g/dL — ABNORMAL HIGH (ref 12.0–15.0)
Immature Granulocytes: 0 %
Lymphocytes Relative: 20 %
Lymphs Abs: 1.6 10*3/uL (ref 0.7–4.0)
MCH: 28.8 pg (ref 26.0–34.0)
MCHC: 30.8 g/dL (ref 30.0–36.0)
MCV: 93.5 fL (ref 80.0–100.0)
Monocytes Absolute: 0.6 10*3/uL (ref 0.1–1.0)
Monocytes Relative: 7 %
Neutro Abs: 5.6 10*3/uL (ref 1.7–7.7)
Neutrophils Relative %: 71 %
Platelets: 243 10*3/uL (ref 150–400)
RBC: 5.35 MIL/uL — ABNORMAL HIGH (ref 3.87–5.11)
RDW: 15.5 % (ref 11.5–15.5)
WBC: 7.9 10*3/uL (ref 4.0–10.5)
nRBC: 0 % (ref 0.0–0.2)

## 2019-02-27 LAB — ABO/RH: ABO/RH(D): O POS

## 2019-03-01 NOTE — Progress Notes (Signed)
Pt aware of surgical time change for Monday 03/04/2019. Pt aware to arrive at [facility] admitting by 9 am and to continue to follow MD instruction in regards to ERAS program. Pt also verbalized understanding to follow PST iInstruction with am medications morning of surgery. Otherwise pt aware of no food or drink after midnight.

## 2019-03-03 MED ORDER — BUPIVACAINE LIPOSOME 1.3 % IJ SUSP
20.0000 mL | INTRAMUSCULAR | Status: DC
  Filled 2019-03-03: qty 20

## 2019-03-04 ENCOUNTER — Other Ambulatory Visit: Payer: Self-pay

## 2019-03-04 ENCOUNTER — Inpatient Hospital Stay (HOSPITAL_COMMUNITY): Payer: 59 | Admitting: Certified Registered"

## 2019-03-04 ENCOUNTER — Encounter (HOSPITAL_COMMUNITY)
Admission: RE | Disposition: A | Discharge status: Home / Self Care | Payer: Self-pay | Source: Home / Self Care | Attending: Surgery

## 2019-03-04 ENCOUNTER — Inpatient Hospital Stay (HOSPITAL_COMMUNITY): Payer: 59 | Admitting: Physician Assistant

## 2019-03-04 ENCOUNTER — Encounter (HOSPITAL_COMMUNITY): Payer: Self-pay | Admitting: *Deleted

## 2019-03-04 ENCOUNTER — Inpatient Hospital Stay (HOSPITAL_COMMUNITY)
Admission: RE | Admit: 2019-03-04 | Discharge: 2019-03-05 | DRG: 327 | Disposition: A | Discharge status: Home / Self Care | Payer: 59 | Attending: Surgery | Admitting: Surgery

## 2019-03-04 DIAGNOSIS — K660 Peritoneal adhesions (postprocedural) (postinfection): Secondary | ICD-10-CM | POA: Diagnosis present

## 2019-03-04 DIAGNOSIS — E785 Hyperlipidemia, unspecified: Secondary | ICD-10-CM | POA: Diagnosis present

## 2019-03-04 DIAGNOSIS — Z9884 Bariatric surgery status: Secondary | ICD-10-CM

## 2019-03-04 DIAGNOSIS — Z8249 Family history of ischemic heart disease and other diseases of the circulatory system: Secondary | ICD-10-CM

## 2019-03-04 DIAGNOSIS — Z8261 Family history of arthritis: Secondary | ICD-10-CM | POA: Diagnosis not present

## 2019-03-04 DIAGNOSIS — Z4651 Encounter for fitting and adjustment of gastric lap band: Secondary | ICD-10-CM | POA: Diagnosis not present

## 2019-03-04 DIAGNOSIS — K9509 Other complications of gastric band procedure: Principal | ICD-10-CM | POA: Diagnosis present

## 2019-03-04 DIAGNOSIS — Z7951 Long term (current) use of inhaled steroids: Secondary | ICD-10-CM | POA: Diagnosis not present

## 2019-03-04 DIAGNOSIS — Z6841 Body Mass Index (BMI) 40.0 and over, adult: Secondary | ICD-10-CM

## 2019-03-04 DIAGNOSIS — Y733 Surgical instruments, materials and gastroenterology and urology devices (including sutures) associated with adverse incidents: Secondary | ICD-10-CM | POA: Diagnosis present

## 2019-03-04 DIAGNOSIS — I10 Essential (primary) hypertension: Secondary | ICD-10-CM | POA: Diagnosis present

## 2019-03-04 DIAGNOSIS — Z800 Family history of malignant neoplasm of digestive organs: Secondary | ICD-10-CM

## 2019-03-04 DIAGNOSIS — Z8349 Family history of other endocrine, nutritional and metabolic diseases: Secondary | ICD-10-CM | POA: Diagnosis not present

## 2019-03-04 DIAGNOSIS — K219 Gastro-esophageal reflux disease without esophagitis: Secondary | ICD-10-CM | POA: Diagnosis present

## 2019-03-04 DIAGNOSIS — Z818 Family history of other mental and behavioral disorders: Secondary | ICD-10-CM

## 2019-03-04 DIAGNOSIS — Z833 Family history of diabetes mellitus: Secondary | ICD-10-CM | POA: Diagnosis not present

## 2019-03-04 DIAGNOSIS — K317 Polyp of stomach and duodenum: Secondary | ICD-10-CM | POA: Diagnosis not present

## 2019-03-04 DIAGNOSIS — E119 Type 2 diabetes mellitus without complications: Secondary | ICD-10-CM | POA: Diagnosis not present

## 2019-03-04 HISTORY — PX: LAPAROSCOPIC GASTRIC BAND REMOVAL WITH LAPAROSCOPIC GASTRIC SLEEVE RESECTION: SHX6498

## 2019-03-04 LAB — TYPE AND SCREEN
ABO/RH(D): O POS
Antibody Screen: NEGATIVE

## 2019-03-04 LAB — HEMOGLOBIN AND HEMATOCRIT, BLOOD
HCT: 48.7 % — ABNORMAL HIGH (ref 36.0–46.0)
Hemoglobin: 15.4 g/dL — ABNORMAL HIGH (ref 12.0–15.0)

## 2019-03-04 LAB — PREGNANCY, URINE: Preg Test, Ur: NEGATIVE

## 2019-03-04 SURGERY — LAPAROSCOPIC GASTRIC BAND REMOVAL WITH LAPAROSCOPIC GASTRIC SLEEVE RESECTION
Anesthesia: General | Site: Abdomen

## 2019-03-04 MED ORDER — GABAPENTIN 300 MG PO CAPS
300.0000 mg | ORAL_CAPSULE | ORAL | Status: AC
  Administered 2019-03-04: 300 mg via ORAL
  Filled 2019-03-04: qty 1

## 2019-03-04 MED ORDER — DEXAMETHASONE SODIUM PHOSPHATE 10 MG/ML IJ SOLN
INTRAMUSCULAR | Status: DC | PRN
  Administered 2019-03-04: 4 mg via INTRAVENOUS

## 2019-03-04 MED ORDER — PROMETHAZINE HCL 25 MG/ML IJ SOLN: 6.2500 mg | INTRAMUSCULAR | Status: DC | PRN

## 2019-03-04 MED ORDER — ACETAMINOPHEN 160 MG/5ML PO SOLN
650.0000 mg | Freq: Four times a day (QID) | ORAL | Status: DC
  Administered 2019-03-04 – 2019-03-05 (×3): 650 mg via ORAL
  Filled 2019-03-04 (×3): qty 20.3

## 2019-03-04 MED ORDER — ONDANSETRON HCL 4 MG/2ML IJ SOLN
INTRAMUSCULAR | Status: DC | PRN
  Administered 2019-03-04: 4 mg via INTRAVENOUS

## 2019-03-04 MED ORDER — OXYCODONE HCL 5 MG/5ML PO SOLN: 5.0000 mg | Freq: Once | ORAL | Status: DC | PRN

## 2019-03-04 MED ORDER — OXYCODONE HCL 5 MG PO TABS: 5.0000 mg | ORAL_TABLET | Freq: Once | ORAL | Status: DC | PRN

## 2019-03-04 MED ORDER — GABAPENTIN 100 MG PO CAPS
200.0000 mg | ORAL_CAPSULE | Freq: Two times a day (BID) | ORAL | Status: DC
  Administered 2019-03-05: 200 mg via ORAL
  Filled 2019-03-04: qty 2

## 2019-03-04 MED ORDER — CHLORHEXIDINE GLUCONATE CLOTH 2 % EX PADS: 6.0000 | MEDICATED_PAD | Freq: Once | CUTANEOUS | Status: DC

## 2019-03-04 MED ORDER — SUGAMMADEX SODIUM 200 MG/2ML IV SOLN
INTRAVENOUS | Status: DC | PRN
  Administered 2019-03-04: 200 mg via INTRAVENOUS

## 2019-03-04 MED ORDER — LIDOCAINE 2% (20 MG/ML) 5 ML SYRINGE
INTRAMUSCULAR | Status: AC
  Filled 2019-03-04: qty 5

## 2019-03-04 MED ORDER — LACTATED RINGERS IV SOLN
INTRAVENOUS | Status: DC
  Administered 2019-03-04: 10:00:00 via INTRAVENOUS

## 2019-03-04 MED ORDER — HYDROMORPHONE HCL 1 MG/ML IJ SOLN
INTRAMUSCULAR | Status: AC
  Filled 2019-03-04: qty 1

## 2019-03-04 MED ORDER — ROCURONIUM BROMIDE 100 MG/10ML IV SOLN
INTRAVENOUS | Status: AC
  Filled 2019-03-04: qty 1

## 2019-03-04 MED ORDER — LIDOCAINE 2% (20 MG/ML) 5 ML SYRINGE: INTRAMUSCULAR | Status: DC | PRN

## 2019-03-04 MED ORDER — 0.9 % SODIUM CHLORIDE (POUR BTL) OPTIME
TOPICAL | Status: DC | PRN
  Administered 2019-03-04: 1000 mL

## 2019-03-04 MED ORDER — ROCURONIUM BROMIDE 10 MG/ML (PF) SYRINGE
PREFILLED_SYRINGE | INTRAVENOUS | Status: DC | PRN
  Administered 2019-03-04: 10 mg via INTRAVENOUS
  Administered 2019-03-04: 50 mg via INTRAVENOUS
  Administered 2019-03-04: 10 mg via INTRAVENOUS
  Administered 2019-03-04 (×2): 20 mg via INTRAVENOUS
  Administered 2019-03-04: 5 mg via INTRAVENOUS

## 2019-03-04 MED ORDER — ENSURE MAX PROTEIN PO LIQD: 2.0000 [oz_av] | ORAL | Status: DC

## 2019-03-04 MED ORDER — OXYCODONE HCL 5 MG/5ML PO SOLN
5.0000 mg | ORAL | Status: DC | PRN
  Administered 2019-03-04 – 2019-03-05 (×3): 5 mg via ORAL
  Filled 2019-03-04 (×3): qty 5

## 2019-03-04 MED ORDER — HEPARIN SODIUM (PORCINE) 5000 UNIT/ML IJ SOLN
5000.0000 [IU] | INTRAMUSCULAR | Status: AC
  Administered 2019-03-04: 5000 [IU] via SUBCUTANEOUS
  Filled 2019-03-04: qty 1

## 2019-03-04 MED ORDER — SUCCINYLCHOLINE CHLORIDE 200 MG/10ML IV SOSY
PREFILLED_SYRINGE | INTRAVENOUS | Status: DC | PRN
  Administered 2019-03-04: 120 mg via INTRAVENOUS

## 2019-03-04 MED ORDER — LIDOCAINE 2% (20 MG/ML) 5 ML SYRINGE
INTRAMUSCULAR | Status: DC | PRN
  Administered 2019-03-04: 60 mg via INTRAVENOUS

## 2019-03-04 MED ORDER — FENTANYL CITRATE (PF) 100 MCG/2ML IJ SOLN
INTRAMUSCULAR | Status: AC
  Filled 2019-03-04: qty 2

## 2019-03-04 MED ORDER — GLYCOPYRROLATE PF 0.2 MG/ML IJ SOSY
PREFILLED_SYRINGE | INTRAMUSCULAR | Status: AC
  Filled 2019-03-04: qty 1

## 2019-03-04 MED ORDER — APREPITANT 40 MG PO CAPS
40.0000 mg | ORAL_CAPSULE | ORAL | Status: AC
  Administered 2019-03-04: 40 mg via ORAL
  Filled 2019-03-04: qty 1

## 2019-03-04 MED ORDER — MORPHINE SULFATE (PF) 2 MG/ML IV SOLN
1.0000 mg | INTRAVENOUS | Status: DC | PRN
  Administered 2019-03-04: 2 mg via INTRAVENOUS
  Filled 2019-03-04: qty 1

## 2019-03-04 MED ORDER — ACETAMINOPHEN 500 MG PO TABS
1000.0000 mg | ORAL_TABLET | ORAL | Status: AC
  Administered 2019-03-04: 1000 mg via ORAL
  Filled 2019-03-04: qty 2

## 2019-03-04 MED ORDER — LACTATED RINGERS IV SOLN
INTRAVENOUS | Status: DC | PRN
  Administered 2019-03-04: 11:00:00 via INTRAVENOUS

## 2019-03-04 MED ORDER — SODIUM CHLORIDE (PF) 0.9 % IJ SOLN
INTRAMUSCULAR | Status: DC | PRN
  Administered 2019-03-04: 10 mL

## 2019-03-04 MED ORDER — GLYCOPYRROLATE 0.2 MG/ML IJ SOLN
INTRAMUSCULAR | Status: DC | PRN
  Administered 2019-03-04: .1 mg via INTRAVENOUS

## 2019-03-04 MED ORDER — ONDANSETRON HCL 4 MG/2ML IJ SOLN: 4.0000 mg | INTRAMUSCULAR | Status: DC | PRN

## 2019-03-04 MED ORDER — EPHEDRINE 5 MG/ML INJ
INTRAVENOUS | Status: AC
  Filled 2019-03-04: qty 10

## 2019-03-04 MED ORDER — SUGAMMADEX SODIUM 200 MG/2ML IV SOLN
INTRAVENOUS | Status: AC
  Filled 2019-03-04: qty 2

## 2019-03-04 MED ORDER — BUPIVACAINE LIPOSOME 1.3 % IJ SUSP
INTRAMUSCULAR | Status: DC | PRN
  Administered 2019-03-04: 20 mL

## 2019-03-04 MED ORDER — GLYCOPYRROLATE 0.2 MG/ML IJ SOLN: INTRAMUSCULAR | Status: DC | PRN

## 2019-03-04 MED ORDER — ONDANSETRON HCL 4 MG/2ML IJ SOLN
INTRAMUSCULAR | Status: AC
  Filled 2019-03-04: qty 2

## 2019-03-04 MED ORDER — SODIUM CHLORIDE 0.9 % IV SOLN
2.0000 g | INTRAVENOUS | Status: AC
  Administered 2019-03-04: 2 g via INTRAVENOUS
  Filled 2019-03-04: qty 2

## 2019-03-04 MED ORDER — FENTANYL CITRATE (PF) 250 MCG/5ML IJ SOLN
INTRAMUSCULAR | Status: DC | PRN
  Administered 2019-03-04: 100 ug via INTRAVENOUS
  Administered 2019-03-04: 25 ug via INTRAVENOUS
  Administered 2019-03-04: 50 ug via INTRAVENOUS
  Administered 2019-03-04: 25 ug via INTRAVENOUS

## 2019-03-04 MED ORDER — PROPOFOL 10 MG/ML IV BOLUS
INTRAVENOUS | Status: AC
  Filled 2019-03-04: qty 60

## 2019-03-04 MED ORDER — HEPARIN SODIUM (PORCINE) 5000 UNIT/ML IJ SOLN
5000.0000 [IU] | Freq: Three times a day (TID) | INTRAMUSCULAR | Status: DC
  Administered 2019-03-04 – 2019-03-05 (×3): 5000 [IU] via SUBCUTANEOUS
  Filled 2019-03-04 (×3): qty 1

## 2019-03-04 MED ORDER — MIDAZOLAM HCL 2 MG/2ML IJ SOLN
INTRAMUSCULAR | Status: AC
  Filled 2019-03-04: qty 2

## 2019-03-04 MED ORDER — LIDOCAINE 2% (20 MG/ML) 5 ML SYRINGE
INTRAMUSCULAR | Status: DC | PRN
  Administered 2019-03-04: 1.5 mg/kg/h via INTRAVENOUS

## 2019-03-04 MED ORDER — LACTATED RINGERS IR SOLN
Status: DC | PRN
  Administered 2019-03-04: 1000 mL

## 2019-03-04 MED ORDER — KETAMINE HCL 10 MG/ML IJ SOLN
INTRAMUSCULAR | Status: DC | PRN
  Administered 2019-03-04: 30 mg via INTRAVENOUS

## 2019-03-04 MED ORDER — SCOPOLAMINE 1 MG/3DAYS TD PT72
1.0000 | MEDICATED_PATCH | TRANSDERMAL | Status: DC
  Administered 2019-03-04: 1.5 mg via TRANSDERMAL
  Filled 2019-03-04: qty 1

## 2019-03-04 MED ORDER — SODIUM CHLORIDE (PF) 0.9 % IJ SOLN
INTRAMUSCULAR | Status: AC
  Filled 2019-03-04: qty 10

## 2019-03-04 MED ORDER — SUCCINYLCHOLINE CHLORIDE 200 MG/10ML IV SOSY
PREFILLED_SYRINGE | INTRAVENOUS | Status: AC
  Filled 2019-03-04: qty 20

## 2019-03-04 MED ORDER — MEPERIDINE HCL 50 MG/ML IJ SOLN: 6.2500 mg | INTRAMUSCULAR | Status: DC | PRN

## 2019-03-04 MED ORDER — EPHEDRINE SULFATE-NACL 50-0.9 MG/10ML-% IV SOSY
PREFILLED_SYRINGE | INTRAVENOUS | Status: DC | PRN
  Administered 2019-03-04: 10 mg via INTRAVENOUS

## 2019-03-04 MED ORDER — DEXAMETHASONE SODIUM PHOSPHATE 10 MG/ML IJ SOLN
INTRAMUSCULAR | Status: AC
  Filled 2019-03-04: qty 1

## 2019-03-04 MED ORDER — HYDRALAZINE HCL 20 MG/ML IJ SOLN
10.0000 mg | INTRAMUSCULAR | Status: DC | PRN
  Filled 2019-03-04: qty 1

## 2019-03-04 MED ORDER — KETAMINE HCL 10 MG/ML IJ SOLN
INTRAMUSCULAR | Status: AC
  Filled 2019-03-04: qty 1

## 2019-03-04 MED ORDER — KCL IN DEXTROSE-NACL 20-5-0.45 MEQ/L-%-% IV SOLN
INTRAVENOUS | Status: DC
  Administered 2019-03-04 – 2019-03-05 (×2): via INTRAVENOUS
  Filled 2019-03-04 (×3): qty 1000

## 2019-03-04 MED ORDER — PANTOPRAZOLE SODIUM 40 MG IV SOLR
40.0000 mg | Freq: Every day | INTRAVENOUS | Status: DC
  Administered 2019-03-04: 40 mg via INTRAVENOUS
  Filled 2019-03-04: qty 40

## 2019-03-04 MED ORDER — PROPOFOL 10 MG/ML IV BOLUS
INTRAVENOUS | Status: DC | PRN
  Administered 2019-03-04: 160 mg via INTRAVENOUS

## 2019-03-04 MED ORDER — HYDROMORPHONE HCL 1 MG/ML IJ SOLN: 0.2500 mg | INTRAMUSCULAR | Status: DC | PRN

## 2019-03-04 MED ORDER — MIDAZOLAM HCL 2 MG/2ML IJ SOLN
INTRAMUSCULAR | Status: DC | PRN
  Administered 2019-03-04: 2 mg via INTRAVENOUS

## 2019-03-04 SURGICAL SUPPLY — 61 items
APPLICATOR COTTON TIP 6 STRL (MISCELLANEOUS) IMPLANT
APPLICATOR COTTON TIP 6IN STRL (MISCELLANEOUS)
APPLIER CLIP 5 13 M/L LIGAMAX5 (MISCELLANEOUS)
APPLIER CLIP ROT 10 11.4 M/L (STAPLE)
APPLIER CLIP ROT 13.4 12 LRG (CLIP)
BLADE SURG 15 STRL LF DISP TIS (BLADE) ×1 IMPLANT
BLADE SURG 15 STRL SS (BLADE) ×1
CABLE HIGH FREQUENCY MONO STRZ (ELECTRODE) IMPLANT
CLIP APPLIE 5 13 M/L LIGAMAX5 (MISCELLANEOUS) IMPLANT
CLIP APPLIE ROT 10 11.4 M/L (STAPLE) IMPLANT
CLIP APPLIE ROT 13.4 12 LRG (CLIP) IMPLANT
COVER WAND RF STERILE (DRAPES) IMPLANT
DERMABOND ADVANCED (GAUZE/BANDAGES/DRESSINGS) ×1
DERMABOND ADVANCED .7 DNX12 (GAUZE/BANDAGES/DRESSINGS) ×1 IMPLANT
DEVICE SUT QUICK LOAD TK 5 (STAPLE) IMPLANT
DEVICE SUT TI-KNOT TK 5X26 (MISCELLANEOUS) IMPLANT
DEVICE SUTURE ENDOST 10MM (ENDOMECHANICALS) IMPLANT
DISSECTOR BLUNT TIP ENDO 5MM (MISCELLANEOUS) IMPLANT
ELECT L-HOOK LAP 45CM DISP (ELECTROSURGICAL) ×2
ELECT PENCIL ROCKER SW 15FT (MISCELLANEOUS) IMPLANT
ELECT REM PT RETURN 15FT ADLT (MISCELLANEOUS) ×2 IMPLANT
ELECTRODE L-HOOK LAP 45CM DISP (ELECTROSURGICAL) ×1 IMPLANT
GAUZE SPONGE 4X4 12PLY STRL (GAUZE/BANDAGES/DRESSINGS) IMPLANT
GLOVE BIOGEL M 8.0 STRL (GLOVE) ×2 IMPLANT
GOWN STRL REUS W/TWL XL LVL3 (GOWN DISPOSABLE) ×8 IMPLANT
GRASPER SUT TROCAR 14GX15 (MISCELLANEOUS) ×2 IMPLANT
HANDLE STAPLE EGIA 4 XL (STAPLE) ×2 IMPLANT
HOVERMATT SINGLE USE (MISCELLANEOUS) ×2 IMPLANT
KIT BASIN OR (CUSTOM PROCEDURE TRAY) ×2 IMPLANT
KIT TURNOVER KIT A (KITS) IMPLANT
MARKER SKIN DUAL TIP RULER LAB (MISCELLANEOUS) IMPLANT
NEEDLE SPNL 22GX3.5 QUINCKE BK (NEEDLE) ×2 IMPLANT
PACK UNIVERSAL I (CUSTOM PROCEDURE TRAY) ×2 IMPLANT
RELOAD TRI 45 ART MED THCK BLK (STAPLE) ×4 IMPLANT
RELOAD TRI 45 ART MED THCK PUR (STAPLE) IMPLANT
RELOAD TRI 60 ART MED THCK BLK (STAPLE) ×6 IMPLANT
RELOAD TRI 60 ART MED THCK PUR (STAPLE) ×2 IMPLANT
SCISSORS LAP 5X45 EPIX DISP (ENDOMECHANICALS) IMPLANT
SET IRRIG TUBING LAPAROSCOPIC (IRRIGATION / IRRIGATOR) ×2 IMPLANT
SET TUBE SMOKE EVAC HIGH FLOW (TUBING) ×2 IMPLANT
SHEARS HARMONIC ACE PLUS 45CM (MISCELLANEOUS) IMPLANT
SLEEVE ADV FIXATION 5X100MM (TROCAR) ×4 IMPLANT
SLEEVE GASTRECTOMY 36FR VISIGI (MISCELLANEOUS) ×2 IMPLANT
SOLUTION ANTI FOG 6CC (MISCELLANEOUS) ×2 IMPLANT
SPONGE LAP 18X18 RF (DISPOSABLE) ×2 IMPLANT
STAPLER VISISTAT 35W (STAPLE) ×2 IMPLANT
SUT MNCRL AB 4-0 PS2 18 (SUTURE) ×10 IMPLANT
SUT SURGIDAC NAB ES-9 0 48 120 (SUTURE) IMPLANT
SUT VICRYL 0 TIES 12 18 (SUTURE) ×2 IMPLANT
SYR 10ML ECCENTRIC (SYRINGE) ×2 IMPLANT
SYR 20CC LL (SYRINGE) ×2 IMPLANT
SYR 50ML LL SCALE MARK (SYRINGE) ×2 IMPLANT
TOWEL OR 17X26 10 PK STRL BLUE (TOWEL DISPOSABLE) ×4 IMPLANT
TOWEL OR NON WOVEN STRL DISP B (DISPOSABLE) ×2 IMPLANT
TRAY FOLEY MTR SLVR 16FR STAT (SET/KITS/TRAYS/PACK) IMPLANT
TROCAR ADV FIXATION 5X100MM (TROCAR) ×2 IMPLANT
TROCAR BLADELESS 15MM (ENDOMECHANICALS) ×2 IMPLANT
TROCAR BLADELESS OPT 5 100 (ENDOMECHANICALS) ×2 IMPLANT
TUBE CALIBRATION LAPBAND (TUBING) IMPLANT
TUBING CONNECTING 10 (TUBING) ×2 IMPLANT
TUBING ENDO SMARTCAP (MISCELLANEOUS) ×2 IMPLANT

## 2019-03-04 NOTE — Progress Notes (Signed)
Discussed post op day goals with patient including ambulation, IS, diet progression, pain, and nausea control.  Questions answered. 

## 2019-03-04 NOTE — Discharge Instructions (Signed)
° ° ° °GASTRIC BYPASS/SLEEVE ° Home Care Instructions ° ° These instructions are to help you care for yourself when you go home. ° °Call: If you have any problems. °• Call [phone] and ask for the surgeon on call °• If you need immediate help, come to the ER at [facility].  °• Tell the ER staff that you are a new post-op gastric bypass or gastric sleeve patient °  °Signs and symptoms to report: • Severe vomiting or nausea °o If you cannot keep down clear liquids for longer than 1 day, call your surgeon  °• Abdominal pain that does not get better after taking your pain medication °• Fever over 100.4° F with chills °• Heart beating over 100 beats a minute °• Shortness of breath at rest °• Chest pain °•  Redness, swelling, drainage, or foul odor at incision (surgical) sites °•  If your incisions open or pull apart °• Swelling or pain in calf (lower leg) °• Diarrhea (Loose bowel movements that happen often), frequent watery, uncontrolled bowel movements °• Constipation, (no bowel movements for 3 days) if this happens: Pick one °o Milk of Magnesia, 2 tablespoons by mouth, 3 times a day for 2 days if needed °o Stop taking Milk of Magnesia once you have a bowel movement °o Call your doctor if constipation continues °Or °o Miralax  (instead of Milk of Magnesia) following the label instructions °o Stop taking Miralax once you have a bowel movement °o Call your doctor if constipation continues °• Anything you think is not normal °  °Normal side effects after surgery: • Unable to sleep at night or unable to focus °• Irritability or moody °• Being tearful (crying) or depressed °These are common complaints, possibly related to your anesthesia medications that put you to sleep, stress of surgery, and change in lifestyle.  This usually goes away a few weeks after surgery.  If these feelings continue, call your primary care doctor. °  °Wound Care: You may have surgical glue, steri-strips, or staples over your incisions after  surgery °• Surgical glue:  Looks like a clear film over your incisions and will wear off a little at a time °• Steri-strips: Strips of tape over your incisions. You may notice a yellowish color on the skin under the steri-strips. This is used to make the   steri-strips stick better. Do not pull the steri-strips off - let them fall off °• Staples: Staples may be removed before you leave the hospital °o If you go home with staples, call [facility], [phone] at for an appointment with your surgeon’s nurse to have staples removed 10 days after surgery. °• Showering: You may shower two (2) days after your surgery unless your surgeon tells you differently °o Wash gently around incisions with warm soapy water, rinse well, and gently pat dry  °o No tub baths until staples are removed, steri-strips fall off or glue is gone.  °  °Medications: • Medications should be liquid or crushed if larger than the size of a dime °• Extended release pills (medication that release a little bit at a time through the day) should NOT be crushed or cut. (examples include XL, ER, DR, SR) °• Depending on the size and number of medications you take, you may need to space (take a few throughout the day)/change the time you take your medications so that you do not over-fill your pouch (smaller stomach) °• Make sure you follow-up with your primary care doctor to   make medication changes needed during rapid weight loss and life-style changes °• If you have diabetes, follow up with the doctor that orders your diabetes medication(s) within one week after surgery and check your blood sugar regularly. °• Do not drive while taking prescription pain medication  °• It is ok to take Tylenol by the bottle instructions with your pain medicine or instead of your pain medicine as needed.  DO NOT TAKE NSAIDS (EXAMPLES OF NSAIDS:  IBUPROFREN/ NAPROXEN)  °Diet:                    First 2 Weeks ° You will see the dietician t about two (2) weeks  after your surgery. The dietician will increase the types of foods you can eat if you are handling liquids well: °• If you have severe vomiting or nausea and cannot keep down clear liquids lasting longer than 1 day, call your surgeon @ ([phone]) °Protein Shake °• Drink at least 2 ounces of shake 5-6 times per day °• Each serving of protein shakes (usually 8 - 12 ounces) should have: °o 15 grams of protein  °o And no more than 5 grams of carbohydrate  °• Goal for protein each day: °o Men = 80 grams per day °o Women = 60 grams per day °• Protein powder may be added to fluids such as non-fat milk or Lactaid milk or unsweetened Soy/Almond milk (limit to 35 grams added protein powder per serving) ° °Hydration °• Slowly increase the amount of water and other clear liquids as tolerated (See Acceptable Fluids) °• Slowly increase the amount of protein shake as tolerated  °•  Sip fluids slowly and throughout the day.  Do not use straws. °• May use sugar substitutes in small amounts (no more than 6 - 8 packets per day; i.e. Splenda) ° °Fluid Goal °• The first goal is to drink at least 8 ounces of protein shake/drink per day (or as directed by the nutritionist); some examples of protein shakes are Syntrax Nectar, Adkins Advantage, EAS Edge HP, and Unjury. See handout from pre-op Bariatric Education Class: °o Slowly increase the amount of protein shake you drink as tolerated °o You may find it easier to slowly sip shakes throughout the day °o It is important to get your proteins in first °• Your fluid goal is to drink 64 - 100 ounces of fluid daily °o It may take a few weeks to build up to this °• 32 oz (or more) should be clear liquids  °And  °• 32 oz (or more) should be full liquids (see below for examples) °• Liquids should not contain sugar, caffeine, or carbonation ° °Clear Liquids: °• Water or Sugar-free flavored water (i.e. Fruit H2O, Propel) °• Decaffeinated coffee or tea (sugar-free) °• Crystal Lite, Wyler’s Lite,  Minute Maid Lite °• Sugar-free Jell-O °• Bouillon or broth °• Sugar-free Popsicle:   *Less than 20 calories each; Limit 1 per day ° °Full Liquids: °Protein Shakes/Drinks + 2 choices per day of other full liquids °• Full liquids must be: °o No More Than 15 grams of Carbs per serving  °o No More Than 3 grams of Fat per serving °• Strained low-fat cream soup (except Cream of Potato or Tomato) °• Non-Fat milk °• Fat-free Lactaid Milk °• Unsweetened Soy Or Unsweetened Almond Milk °• Low Sugar yogurt (Dannon Lite & Fit, Greek yogurt; Oikos Triple Zero; Chobani Simply 100; Yoplait 100 calorie Greek - No Fruit on the Bottom) ° °  °Vitamins   and Minerals • Start 1 day after surgery unless otherwise directed by your surgeon °• 2 Chewable Bariatric Specific Multivitamin / Multimineral Supplement with iron (Example: Bariatric Advantage Multi EA) °• Chewable Calcium with Vitamin D-3 °(Example: 3 Chewable Calcium Plus 600 with Vitamin D-3) °o Take 500 mg three (3) times a day for a total of 1500 mg each day °o Do not take all 3 doses of calcium at one time as it may cause constipation, and you can only absorb 500 mg  at a time  °o Do not mix multivitamins containing iron with calcium supplements; take 2 hours apart °• Menstruating women and those with a history of anemia (a blood disease that causes weakness) may need extra iron °o Talk with your doctor to see if you need more iron °• Do not stop taking or change any vitamins or minerals until you talk to your dietitian or surgeon °• Your Dietitian and/or surgeon must approve all vitamin and mineral supplements °  °Activity and Exercise: Limit your physical activity as instructed by your doctor.  It is important to continue walking at home.  During this time, use these guidelines: °• Do not lift anything greater than ten (10) pounds for at least two (2) weeks °• Do not go back to work or drive until your surgeon says you can °• You may have sex when you feel comfortable  °o It is  VERY important for female patients to use a reliable birth control method; fertility often increases after surgery  °o All hormonal birth control will be ineffective for 30 days after surgery due to medications given during surgery a barrier method must be used. °o Do not get pregnant for at least 18 months °• Start exercising as soon as your doctor tells you that you can °o Make sure your doctor approves any physical activity °• Start with a simple walking program °• Walk 5-15 minutes each day, 7 days per week.  °• Slowly increase until you are walking 30-45 minutes per day °Consider joining our BELT program. [phone] or email [email or web address] °  °Special Instructions Things to remember: °• Use your CPAP when sleeping if this applies to you ° °• [facility] has two free Bariatric Surgery Support Groups that meet monthly °o The 3rd Thursday of each month, 6 pm, [facility]  °o The 2nd Friday of each month, 11:45 am in the private dining room in the basement of [facility] °• It is very important to keep all follow up appointments with your surgeon, dietitian, primary care physician, and behavioral health practitioner °• Routine follow up schedule with your surgeon include appointments at 2-3 weeks, 6-8 weeks, 6 months, and 1 year at a minimum.  Your surgeon may request to see you more often.   °o After the first year, please follow up with your bariatric surgeon and dietitian at least once a year in order to maintain best weight loss results °[facility]: [phone] °[facility]: [phone] °Bariatric Nurse Coordinator: [phone] °  °   Reviewed and Endorsed  °by [facility] Patient Education Committee, June, 2016 °Edits Approved: Aug, 2018 ° ° ° °

## 2019-03-04 NOTE — H&P (Signed)
Chief Complaint:  Recurrent GER with lapband fills  History of Present Illness:  Susan Dorsey is an 58 y.o. female who underwent lapband and vagotomy in Jan 2008.  She has done well until recently when we have been unable to keep her in the green zone without producing GER.  We discussed removal of her lapband and conversion to sleeve gastrectomy.    Past Medical History:  Diagnosis Date  . Allergy   . Anemia   . B12 deficiency   . Gallbladder problem   . GERD (gastroesophageal reflux disease)   . Gluten intolerance   . Gluten intolerance   . Hyperlipidemia    borderline  . Hypertension    Resolved after LAGB  . Joint pain   . Keratoconus   . Morbid obesity (HCC)   . Obesity   . Peeling of nails   . Pneumonia   . Swallowing difficulty   . Swelling    feet and legs  . TMJ (dislocation of temporomandibular joint)   . Vitamin D deficiency   . Wears glasses   . White coat syndrome with diagnosis of hypertension     Past Surgical History:  Procedure Laterality Date  . CHOLECYSTECTOMY  1990's   Patient unsure of date  . LAPAROSCOPIC GASTRIC BANDING  01/23/2007  . PANNICULECTOMY  09/24/2012   Procedure: PANNICULECTOMY;  Surgeon: Valarie Merino, MD;  Location: [facility];  Service: General;  Laterality: N/A;  . TONSILLECTOMY     1972 and adneoids    Current Facility-Administered Medications  Medication Dose Route Frequency Provider Last Rate Last Dose  . bupivacaine liposome (EXPAREL) 1.3 % injection 266 mg  20 mL Infiltration On Call to OR Luretha Murphy, MD       Current Outpatient Medications  Medication Sig Dispense Refill  . b complex vitamins capsule Take 1 capsule by mouth daily.    . Biotin 5000 MCG CAPS Take 5,000 mcg by mouth daily.    . calcium carbonate (TUMS - DOSED IN MG ELEMENTAL CALCIUM) 500 MG chewable tablet Chew 2 tablets by mouth daily as needed for indigestion or heartburn.    . Carboxymethylcellul-Glycerin (LUBRICATING EYE DROPS OP) Place 1 drop into  both eyes daily.    . cetirizine (ZYRTEC) 10 MG tablet Take 10 mg by mouth daily.     . DHA-EPA-Vitamin E (OMEGA-3 COMPLEX PO) Take 2 capsules by mouth 2 (two) times daily.    . diphenhydrAMINE (BENADRYL) 25 MG tablet Take 25 mg by mouth at bedtime.     . Ferrous Sulfate (IRON PO) Take 25 mg by mouth daily at 2 PM.    . FIBER ADULT GUMMIES 2 g CHEW Chew 2 tablets by mouth daily.     . fluticasone (FLONASE) 50 MCG/ACT nasal spray Place 2 sprays into the nose daily.     . Magnesium 250 MG TABS Take 250 mg by mouth daily.     . Melatonin 5 MG CAPS Take 5 mg by mouth at bedtime.     . montelukast (SINGULAIR) 10 MG tablet Take 10 mg by mouth daily.     . Multiple Vitamin (MULTIVITAMIN WITH MINERALS) TABS Take 1 tablet by mouth daily.    Marland Kitchen omeprazole (PRILOSEC) 40 MG capsule Take 40 mg by mouth daily.     . Plant Sterols and Stanols (CHOLEST OFF PO) Take 2 capsules by mouth daily.    . vitamin C (ASCORBIC ACID) 500 MG tablet Take 500 mg by mouth daily at 2  PM.     . Vitamin D, Ergocalciferol, (DRISDOL) 1.25 MG (50000 UT) CAPS capsule Take one tab every 14 days (Patient taking differently: Take 50,000 Units by mouth every 14 (fourteen) days. ) 2 capsule 1  . ZYLET 0.5-0.3 % SUSP Apply 1 drop to eye See admin instructions. Instill 1 drop into infected eye 3 times daily for 1 week as needed for eye infections    . norgestrel-ethinyl estradiol (CRYSELLE-28) 0.3-30 MG-MCG tablet Take 1 tablet by mouth daily.     Aspirin; Nsaids; Chocolate; Gluten meal; and Other Family History  Problem Relation Age of Onset  . Hypertension Mother   . Arthritis Mother   . Diabetes Mother   . Hyperlipidemia Mother   . Heart disease Mother   . Depression Mother   . Anxiety disorder Mother   . Sleep apnea Mother   . Eating disorder Mother   . Obstructive Sleep Apnea Mother   . Obesity Mother   . Cancer Father        lliver, pancreas, stomach  . Liver disease Father   . Chronic fatigue Sister    Social History:    reports that she has never smoked. She has never used smokeless tobacco. She reports that she does not drink alcohol or use drugs.   REVIEW OF SYSTEMS : Negative except for see problem list  Physical Exam:   There were no vitals taken for this visit. There is no height or weight on file to calculate BMI.  Gen:  WDWN WF NAD  Neurological: Alert and oriented to person, place, and time. Motor and sensory function is grossly intact  Head: Normocephalic and atraumatic.  Eyes: Conjunctivae are normal. Pupils are equal, round, and reactive to light. No scleral icterus.  Neck: Normal range of motion. Neck supple. No tracheal deviation or thyromegaly present.  Cardiovascular:  SR without murmurs or gallops.  No carotid bruits Breast:  Not examined Respiratory: Effort normal.  No respiratory distress. No chest wall tenderness. Breath sounds normal.  No wheezes, rales or rhonchi.  Abdomen:  Port in the right upper quadrant GU:  Not examined Musculoskeletal: Normal range of motion. Extremities are nontender. No cyanosis, edema or clubbing noted Lymphadenopathy: No cervical, preauricular, postauricular or axillary adenopathy is present Skin: Skin is warm and dry. No rash noted. No diaphoresis. No erythema. No pallor. Pscyh: Normal mood and affect. Behavior is normal. Judgment and thought content normal.   LABORATORY RESULTS: No results found for this or any previous visit (from the past 48 hour(s)).   RADIOLOGY RESULTS: No results found.  Problem List: Patient Active Problem List   Diagnosis Date Noted  . Status post gastric surgery 01/01/2019  . Other fatigue 07/14/2017  . Dyspnea on exertion 07/14/2017  . Vitamin D deficiency 07/14/2017  . B12 deficiency 07/14/2017  . Class 3 severe obesity with serious comorbidity and body mass index (BMI) of 40.0 to 44.9 in adult (HCC) 07/14/2017  . Status post panniculectomy Sept 2013 11/02/2012  . Lapband APS trucal vagotomy Jan 2008 05/30/2012  .  Panniculitis 08/19/2011    Assessment & Plan: Lapband for removal and conversion to sleeve gastrectomy.      Matt B. Daphine Deutscher, MD, [facility] [phone] beeper [phone]  03/04/2019 7:44 AM

## 2019-03-04 NOTE — Transfer of Care (Signed)
Immediate Anesthesia Transfer of Care Note  Patient: Susan Dorsey  Procedure(s) Performed: LAPAROSCOPIC GASTRIC BAND REMOVAL WITH LAPAROSCOPIC GASTRIC SLEEVE RESECTION, Upper Endo, ERAS Pathway (N/A Abdomen)  Patient Location: PACU  Anesthesia Type:General  Level of Consciousness: awake, alert , oriented and patient cooperative  Airway & Oxygen Therapy: Patient Spontanous Breathing and Patient connected to face mask oxygen  Post-op Assessment: Report given to RN and Post -op Vital signs reviewed and stable  Post vital signs: Reviewed and stable  Last Vitals:  Vitals Value Taken Time  BP    Temp    Pulse    Resp    SpO2      Last Pain:  Vitals:   03/04/19 0928  TempSrc: Oral         Complications: No apparent anesthesia complications

## 2019-03-04 NOTE — Op Note (Signed)
04 March 2019  Surgeon: Wenda Low, MD, FACS  Asst:  Jaclynn Guarneri, MD, FACS  Anes:  General endotracheal  Procedure: Laparoscopic removal of lapband APS with  sleeve gastrectomy and upper endoscopy  Diagnosis: Morbid obesity  Complications: None noted  EBL:   30 cc  Description of Procedure:  The patient was take to OR 2 and given general anesthesia.  The abdomen was prepped with Technicare and draped sterilely.  A timeout was performed.  Access to the abdomen was achieved with a 5 mm Optiview through the left upper quadrant.  There were adhesions in the midline that were sharply lysed.  To do this the [facility] port was placed and used to perform this dissection.  Standard trocars were place.  .  Following insufflation, the state of the abdomen was found to be with adhesions and these were lysed. The lapband removal ensued using sharp dissection, electrocautery and the cordless ultrasonic dissector.  The band was unbuckled and divided and removed through the 15 mm port.  The cicatrix was divided and the plication sutures were also divided.  The plication was completely taken down.  More anterior scar was related to prior vagotomy.    The ViSiGi 36Fr tube was inserted to deflate the stomach and was pulled back into the esophagus.    The pylorus was identified and we measured 5 cm back and marked the antrum.  At that point we began dissection to take down the greater curvature of the stomach using the [employer]ien ultrasonic dissector scalpel.  This dissection was taken all the way up to the left crus.  Posterior attachments of the stomach were also taken down.    The ViSiGi tube was then passed into the antrum and suction applied so that it was snug along the lessor curvature.  The "crow's foot" or incisura was identified.  The sleeve gastrectomy was begun using the [employer] stapler beginning with a 4.5 cm black load with TRS followed by 6 cm black x 2 then one purple load with TRS and  then completed with black loads and tRS.  Marland Kitchen  When the sleeve was complete the tube was taken off suction and insufflated briefly.  The tube was withdrawn.   Upper endoscopy was then performed by Dr. Johna Sheriff.     The specimen was extracted through the 15 trocar site.  This site was closed with PMS  Local was provided by infiltrating with Exparel block on both sides and closed 4-0 Monocryl and Dermabond.  The port and remaining tubing were removed.    Matt B. Daphine Deutscher, MD, [facility], [place] [phone]

## 2019-03-04 NOTE — Anesthesia Postprocedure Evaluation (Signed)
Anesthesia Post Note  Patient: SEELA SCHILZ  Procedure(s) Performed: LAPAROSCOPIC GASTRIC BAND REMOVAL WITH LAPAROSCOPIC GASTRIC SLEEVE RESECTION, Upper Endo, ERAS Pathway (N/A Abdomen)     Patient location during evaluation: PACU Anesthesia Type: General Level of consciousness: awake and alert Pain management: pain level controlled Vital Signs Assessment: post-procedure vital signs reviewed and stable Respiratory status: spontaneous breathing, nonlabored ventilation, respiratory function stable and patient connected to nasal cannula oxygen Cardiovascular status: blood pressure returned to baseline and stable Postop Assessment: no apparent nausea or vomiting Anesthetic complications: no    Last Vitals:  Vitals:   03/04/19 1649 03/04/19 1756  BP: (!) 177/86 (!) 150/77  Pulse: (!) 56 64  Resp: 17 18  Temp: 36.7 C 36.8 C  SpO2: 100% 99%    Last Pain:  Vitals:   03/04/19 1756  TempSrc: Oral  PainSc:                  Phillips Grout

## 2019-03-04 NOTE — Anesthesia Preprocedure Evaluation (Signed)
Anesthesia Evaluation  Patient identified by MRN, date of birth, ID band Patient awake    Reviewed: Allergy & Precautions, H&P , NPO status , Patient's Chart, lab work & pertinent test results  Airway Mallampati: II  TM Distance: >3 FB Neck ROM: Full    Dental  (+) Teeth Intact, Dental Advisory Given   Pulmonary neg pulmonary ROS,    Pulmonary exam normal breath sounds clear to auscultation       Cardiovascular hypertension, Pt. on medications Normal cardiovascular exam Rhythm:Regular Rate:Normal     Neuro/Psych negative neurological ROS  negative psych ROS   GI/Hepatic Neg liver ROS, GERD  Medicated,  Endo/Other  neg diabetesMorbid obesity  Renal/GU Renal disease     Musculoskeletal negative musculoskeletal ROS (+)   Abdominal (+) + obese,   Peds  Hematology negative hematology ROS (+)   Anesthesia Other Findings   Reproductive/Obstetrics                             Anesthesia Physical  Anesthesia Plan  ASA: III  Anesthesia Plan: General   Post-op Pain Management:    Induction: Intravenous  PONV Risk Score and Plan: 3 and Ondansetron, Dexamethasone and Midazolam  Airway Management Planned: Oral ETT  Additional Equipment:   Intra-op Plan:   Post-operative Plan: Extubation in OR  Informed Consent: I have reviewed the patients History and Physical, chart, labs and discussed the procedure including the risks, benefits and alternatives for the proposed anesthesia with the patient or authorized representative who has indicated his/her understanding and acceptance.     Dental advisory given  Plan Discussed with: CRNA and Surgeon  Anesthesia Plan Comments:         Anesthesia Quick Evaluation

## 2019-03-04 NOTE — Op Note (Signed)
Procedure: Upper GI endoscopy  Description of procedure: Upper GI endoscopy is performed at the completion of laparoscopic sleeve gastrectomy by Dr.  Daphine Deutscher  The video endoscope was introduced into the upper esophagus and then passed to the EG junction at about 38 cm. The esophagus appeared somewhat tortuous and slightly dilated . The gastric sleeve was entered. The sleeve was tensely distended with air while the outlet was obstructed under saline irrigation by the operating surgeon. There was no evidence of leak. The staple line was intact and without bleeding. The scope was advanced to the antrum and pylorus visualized. There was no stricture or twisting or mucosal abnormality, and particularly no narrowing noted at the incisura.  The pouch was then desufflated and the scope withdrawn.  Mariella Saa MD, FACS  03/04/2019, 4:22 PM

## 2019-03-04 NOTE — Progress Notes (Signed)
PHARMACY CONSULT FOR:  Risk Assessment for Post-Discharge VTE Following Bariatric Surgery  Post-Discharge VTE Risk Assessment: This patient's probability of 30-day post-discharge VTE is increased due to the factors marked:   Female    Age >/=60 years    BMI >/=50 kg/m2    CHF    Dyspnea at Rest    Paraplegia  x  Non-gastric-band surgery    Operation Time >/=3 hr    Return to OR     Length of Stay >/= 3 d   Predicted probability of 30-day post-discharge VTE: 0.16 %  Other patient-specific factors to consider: n/a  Recommendation for Discharge: No pharmacologic prophylaxis post-discharge   Susan Dorsey is a 58 y.o. female who underwent laparoscopic band removal with laparascopic gastric sleeve resection on 03/04/19   Case start: 1133 Case end: 1420   Allergies  Allergen Reactions  . Aspirin Swelling    Lips only.  . Nsaids Swelling    Lip swelling, digestive issues.  Tolerates aleve   . Chocolate     Acne   . Gluten Meal     Irritable bowls   . Other Itching    Marijuana - lips swelling     Patient Measurements: Weight: 258 lb 9.6 oz (117.3 kg) Body mass index is 41.74 kg/m.  No results for input(s): WBC, HGB, HCT, PLT, APTT, CREATININE, LABCREA, CREATININE, CREAT24HRUR, MG, PHOS, ALBUMIN, PROT, ALBUMIN, AST, ALT, ALKPHOS, BILITOT, BILIDIR, IBILI in the last 72 hours. Estimated Creatinine Clearance: 103.5 mL/min (by C-G formula based on SCr of 0.76 mg/dL).    Past Medical History:  Diagnosis Date  . Allergy   . Anemia   . B12 deficiency   . Gallbladder problem   . GERD (gastroesophageal reflux disease)   . Gluten intolerance   . Gluten intolerance   . Hyperlipidemia    borderline  . Hypertension    Resolved after LAGB  . Joint pain   . Keratoconus   . Morbid obesity (HCC)   . Obesity   . Peeling of nails   . Pneumonia   . Swallowing difficulty   . Swelling    feet and legs  . TMJ (dislocation of temporomandibular joint)   . Vitamin D  deficiency   . Wears glasses   . White coat syndrome with diagnosis of hypertension      Medications Prior to Admission  Medication Sig Dispense Refill Last Dose  . b complex vitamins capsule Take 1 capsule by mouth daily.   03/02/2019  . Biotin 5000 MCG CAPS Take 5,000 mcg by mouth daily.   02/25/2019  . calcium carbonate (TUMS - DOSED IN MG ELEMENTAL CALCIUM) 500 MG chewable tablet Chew 2 tablets by mouth daily as needed for indigestion or heartburn.   03/03/2019 at 2300  . Carboxymethylcellul-Glycerin (LUBRICATING EYE DROPS OP) Place 1 drop into both eyes daily.   03/03/2019  . cetirizine (ZYRTEC) 10 MG tablet Take 10 mg by mouth daily.    03/04/2019 at 0700  . DHA-EPA-Vitamin E (OMEGA-3 COMPLEX PO) Take 2 capsules by mouth 2 (two) times daily.   02/25/2019  . diphenhydrAMINE (BENADRYL) 25 MG tablet Take 25 mg by mouth at bedtime.    03/03/2019 at 2100  . Ferrous Sulfate (IRON PO) Take 25 mg by mouth daily at 2 PM.   03/03/2019  . FIBER ADULT GUMMIES 2 g CHEW Chew 2 tablets by mouth daily.    02/25/2019  . fluticasone (FLONASE) 50 MCG/ACT nasal spray Place 2 sprays into  the nose daily.    03/04/2019 at 0730  . Magnesium 250 MG TABS Take 250 mg by mouth daily.    02/25/2019  . Melatonin 5 MG CAPS Take 5 mg by mouth at bedtime.    02/25/2019  . montelukast (SINGULAIR) 10 MG tablet Take 10 mg by mouth daily.    03/04/2019 at 0730  . Multiple Vitamin (MULTIVITAMIN WITH MINERALS) TABS Take 1 tablet by mouth daily.   03/03/2019  . omeprazole (PRILOSEC) 40 MG capsule Take 40 mg by mouth daily.    03/04/2019 at 0700  . Plant Sterols and Stanols (CHOLEST OFF PO) Take 2 capsules by mouth daily.   02/25/2019  . vitamin C (ASCORBIC ACID) 500 MG tablet Take 500 mg by mouth daily at 2 PM.    03/03/2019  . Vitamin D, Ergocalciferol, (DRISDOL) 1.25 MG (50000 UT) CAPS capsule Take one tab every 14 days (Patient taking differently: Take 50,000 Units by mouth every 14 (fourteen) days. ) 2 capsule 1 02/11/2019  . norgestrel-ethinyl estradiol  (CRYSELLE-28) 0.3-30 MG-MCG tablet Take 1 tablet by mouth daily.   02/18/2019  . ZYLET 0.5-0.3 % SUSP Apply 1 drop to eye See admin instructions. Instill 1 drop into infected eye 3 times daily for 1 week as needed for eye infections   More than a month at Unknown time    Cindi Carbon, PharmD 03/04/2019,2:52 PM

## 2019-03-04 NOTE — Anesthesia Procedure Notes (Signed)
Procedure Name: Intubation Date/Time: 03/04/2019 11:16 AM Performed by: Eben Burow, CRNA Pre-anesthesia Checklist: Patient identified, Emergency Drugs available, Suction available, Patient being monitored and Timeout performed Patient Re-evaluated:Patient Re-evaluated prior to induction Oxygen Delivery Method: Circle system utilized Preoxygenation: Pre-oxygenation with 100% oxygen Induction Type: IV induction Ventilation: Mask ventilation without difficulty Laryngoscope Size: Mac and 4 Grade View: Grade I Tube type: Oral Tube size: 7.0 mm Number of attempts: 1 Airway Equipment and Method: Stylet Placement Confirmation: ETT inserted through vocal cords under direct vision,  positive ETCO2 and breath sounds checked- equal and bilateral Secured at: 22 cm Tube secured with: Tape Dental Injury: Teeth and Oropharynx as per pre-operative assessment

## 2019-03-04 NOTE — Interval H&P Note (Signed)
History and Physical Interval Note:  03/04/2019 10:18 AM  Susan Dorsey  has presented today for surgery, with the diagnosis of Morbid Obesity, Esophageal Dilatation, Lap Band Slip, Dysphagia, HTN.  The various methods of treatment have been discussed with the patient and family. After consideration of risks, benefits and other options for treatment, the patient has consented to  Procedure(s): LAPAROSCOPIC GASTRIC BAND REMOVAL WITH LAPAROSCOPIC GASTRIC SLEEVE RESECTION, Upper Endo, ERAS Pathway (N/A) as a surgical intervention.  The patient's history has been reviewed, patient examined, no change in status, stable for surgery.  I have reviewed the patient's chart and labs.  Questions were answered to the patient's satisfaction.     Valarie Merino

## 2019-03-05 ENCOUNTER — Encounter (HOSPITAL_COMMUNITY): Payer: Self-pay | Admitting: Surgery

## 2019-03-05 LAB — CBC WITH DIFFERENTIAL/PLATELET
Abs Immature Granulocytes: 0.04 10*3/uL (ref 0.00–0.07)
Basophils Absolute: 0 10*3/uL (ref 0.0–0.1)
Basophils Relative: 0 %
Eosinophils Absolute: 0 10*3/uL (ref 0.0–0.5)
Eosinophils Relative: 0 %
HCT: 43 % (ref 36.0–46.0)
Hemoglobin: 13.4 g/dL (ref 12.0–15.0)
Immature Granulocytes: 0 %
Lymphocytes Relative: 11 %
Lymphs Abs: 1.4 10*3/uL (ref 0.7–4.0)
MCH: 29.3 pg (ref 26.0–34.0)
MCHC: 31.2 g/dL (ref 30.0–36.0)
MCV: 94.1 fL (ref 80.0–100.0)
Monocytes Absolute: 1.3 10*3/uL — ABNORMAL HIGH (ref 0.1–1.0)
Monocytes Relative: 10 %
Neutro Abs: 10 10*3/uL — ABNORMAL HIGH (ref 1.7–7.7)
Neutrophils Relative %: 79 %
Platelets: 219 10*3/uL (ref 150–400)
RBC: 4.57 MIL/uL (ref 3.87–5.11)
RDW: 15.2 % (ref 11.5–15.5)
WBC: 12.7 10*3/uL — ABNORMAL HIGH (ref 4.0–10.5)
nRBC: 0 % (ref 0.0–0.2)

## 2019-03-05 MED ORDER — ONDANSETRON 4 MG PO TBDP: 4.0000 mg | ORAL_TABLET | Freq: Four times a day (QID) | ORAL | 0 refills | Status: DC | PRN

## 2019-03-05 MED ORDER — OXYCODONE HCL 5 MG/5ML PO SOLN: 5.0000 mg | Freq: Four times a day (QID) | ORAL | 0 refills | Status: DC | PRN

## 2019-03-05 NOTE — Discharge Summary (Signed)
Physician Discharge Summary  Patient ID: Susan Dorsey MRN: [id number] DOB/AGE: [birth year] 56 y.o.  PCP: Eartha Inch, MD  Admit date: 03/04/2019 Discharge date: 03/05/2019  Admission Diagnoses:  Prior lapband vagotomy  Discharge Diagnoses:  Same with removal of lapband and conversion to sleeve gastrectomy  Active Problems:   S/P laparoscopic sleeve gastrectomy   Surgery:  Removal of lapband and conversion to sleeve gastrectomy  Discharged Condition: improved  Hospital Course:   Had surgery on Monday;  Did well and was ready for discharge on Tuesday afternoon.    Consults: none  Significant Diagnostic Studies: none    Discharge Exam: Blood pressure (!) 153/78, pulse 60, temperature 98.7 F (37.1 C), temperature source Oral, resp. rate 18, weight 117.3 kg, SpO2 98 %. Incisions OK  Disposition: Discharge disposition: 01-Home or Self Care       Discharge Instructions    Ambulate hourly while awake   Complete by:  As directed    Call MD for:  difficulty breathing, headache or visual disturbances   Complete by:  As directed    Call MD for:  persistant dizziness or light-headedness   Complete by:  As directed    Call MD for:  persistant nausea and vomiting   Complete by:  As directed    Call MD for:  redness, tenderness, or signs of infection (pain, swelling, redness, odor or green/yellow discharge around incision site)   Complete by:  As directed    Call MD for:  severe uncontrolled pain   Complete by:  As directed    Call MD for:  temperature >101 F   Complete by:  As directed    Diet bariatric full liquid   Complete by:  As directed    Incentive spirometry   Complete by:  As directed    Perform hourly while awake     Allergies as of 03/05/2019      Reactions   Aspirin Swelling   Lips only.   Nsaids Swelling   Lip swelling, digestive issues.  Tolerates aleve    Chocolate    Acne    Gluten Meal    Irritable bowls    Other Itching   Marijuana -  lips swelling       Medication List    TAKE these medications   b complex vitamins capsule Take 1 capsule by mouth daily.   Biotin 5000 MCG Caps Take 5,000 mcg by mouth daily.   calcium carbonate 500 MG chewable tablet Commonly known as:  TUMS - dosed in mg elemental calcium Chew 2 tablets by mouth daily as needed for indigestion or heartburn.   cetirizine 10 MG tablet Commonly known as:  ZYRTEC Take 10 mg by mouth daily.   CHOLEST OFF PO Take 2 capsules by mouth daily.   Cryselle-28 0.3-30 MG-MCG tablet Generic drug:  norgestrel-ethinyl estradiol Take 1 tablet by mouth daily.   diphenhydrAMINE 25 MG tablet Commonly known as:  BENADRYL Take 25 mg by mouth at bedtime.   Fiber Adult Gummies 2 g Chew Chew 2 tablets by mouth daily.   fluticasone 50 MCG/ACT nasal spray Commonly known as:  FLONASE Place 2 sprays into the nose daily.   IRON PO Take 25 mg by mouth daily at 2 PM.   LUBRICATING EYE DROPS OP Place 1 drop into both eyes daily.   Magnesium 250 MG Tabs Take 250 mg by mouth daily.   Melatonin 5 MG Caps Take 5 mg by mouth at bedtime.  montelukast 10 MG tablet Commonly known as:  SINGULAIR Take 10 mg by mouth daily.   multivitamin with minerals Tabs tablet Take 1 tablet by mouth daily.   OMEGA-3 COMPLEX PO Take 2 capsules by mouth 2 (two) times daily.   omeprazole 40 MG capsule Commonly known as:  PRILOSEC Take 40 mg by mouth daily.   ondansetron 4 MG disintegrating tablet Commonly known as:  ZOFRAN-ODT Take 1 tablet (4 mg total) by mouth every 6 (six) hours as needed for nausea or vomiting.   oxyCODONE 5 MG/5ML solution Commonly known as:  ROXICODONE Take 5 mLs (5 mg total) by mouth every 6 (six) hours as needed for severe pain.   vitamin C 500 MG tablet Commonly known as:  ASCORBIC ACID Take 500 mg by mouth daily at 2 PM.   Vitamin D (Ergocalciferol) 1.25 MG (50000 UT) Caps capsule Commonly known as:  DRISDOL Take one tab every 14  days What changed:    how much to take  how to take this  when to take this  additional instructions   Zylet 0.5-0.3 % Susp Generic drug:  Loteprednol-Tobramycin Apply 1 drop to eye See admin instructions. Instill 1 drop into infected eye 3 times daily for 1 week as needed for eye infections      Follow-up Information    [facility], [place]. Go on 03/21/2019.   Specialty:  General Surgery Why:  at 1045 with Dr Luretha Murphy Contact information: [address] [place] [address] [phone]        Hedda Slade, PA-C. Go on 04/11/2019.   Specialty:  General Surgery Why:  at 9 am Contact information: [address] [place] [address] [phone]           Signed: Valarie Merino 03/05/2019, 1:58 PM

## 2019-03-05 NOTE — Progress Notes (Signed)
Pt alert and oriented, tolerating fluids. D/C instructions were given, all questions answered. Pt was d/cd home.

## 2019-03-05 NOTE — Plan of Care (Signed)

## 2019-03-05 NOTE — Progress Notes (Signed)
Patient alert and oriented, pain is controlled. Patient is tolerating fluids, advanced to protein shake today, patient is tolerating well.  Reviewed Gastric sleeve discharge instructions with patient and patient is able to articulate understanding.  Provided information on BELT program, Support Group and [facility] outpatient pharmacy. All questions answered, will continue to monitor.  Total fluid intake 690 Per dehydration protocol call back one week post op 

## 2019-03-11 ENCOUNTER — Telehealth (HOSPITAL_COMMUNITY): Payer: Self-pay

## 2019-03-11 NOTE — Telephone Encounter (Addendum)
Patient unable to call due to work constraints sent the following information via e-mail communication.  See below:   1.  Tell me about your pain and pain management?denies pain, just sore  2.  Let's talk about fluid intake.  How much total fluid are you taking in? Generally, two 11-ounce protein drinks, two 20-ounce glasses of decaf tea/Crystal Light, half cup sugar-free Jello, cup of nonfat Greek yogurt and cup of strained soup/broth. So probably 80 ounces.  3.  How much protein have you taken in the last 2 days?60-75 depending on what protein drink choosen  4.  Have you had nausea?  Tell me about when have experienced nausea and what you did to help? Denies nausea  5.  Has the frequency or color changed with your urine?no dark urine adequate amounts  6.  Tell me what your incisions look like?no problems  7.  Have you been passing gas? BM?yes  8.  If a problem or question were to arise who would you call?  Do you know contact numbers for [facility], [facility], and [facility]?  9.  How has the walking going?back at work, walking  10.  How are your vitamins and calcium going?  How are you taking them?Sometimes I forget the third calcium, but I am improving on this.

## 2019-03-19 ENCOUNTER — Encounter: Payer: 59 | Attending: General Surgery | Admitting: Skilled Nursing Facility1

## 2019-03-19 ENCOUNTER — Other Ambulatory Visit: Payer: Self-pay

## 2019-03-19 DIAGNOSIS — E669 Obesity, unspecified: Secondary | ICD-10-CM | POA: Diagnosis not present

## 2019-03-20 NOTE — Progress Notes (Signed)
Bariatric Class:  Appt start time: 1530 end time:  1630.  2 Week Post-Operative Nutrition Class  Patient was seen on 03/19/2019 for Post-[facility]ative Nutrition education at [facility].   Pt states she is not making a 2 month appt from surgery because the bariatric nurse coordinator will send her the diet phases: Dietitian educated the need to follow up with a dietitian per the latest research as well as physically being assessed for nutritional deficiencies but that decision was completely up to her as long as she was properly educated on why it is recommended to follow up with a dietitian.   Surgery date: 03/04/2019 Surgery type: sleeve Weight today: 252.5  The following the learning objectives were met by the patient during this course:  Identifies Phase 3A (Soft, High Proteins) Dietary Goals and will begin from 2 weeks post-operatively to 2 months post-operatively  Identifies appropriate sources of fluids and proteins   States protein recommendations and appropriate sources post-operatively  Identifies the need for appropriate texture modifications, mastication, and bite sizes when consuming solids  Identifies appropriate multivitamin and calcium sources post-operatively  Describes the need for physical activity post-operatively and will follow MD recommendations  States when to call healthcare provider regarding medication questions or post-operative complications  Handouts given during class include:  Phase 3A: Soft, High Protein Diet Handout  Follow-Up Plan: Patient will follow-up at [facility] in 6 weeks for 2 month post-op nutrition visit for diet advancement per MD.

## 2019-03-26 ENCOUNTER — Telehealth: Payer: Self-pay | Admitting: Skilled Nursing Facility1

## 2019-03-26 NOTE — Telephone Encounter (Signed)
RD called pt to verify fluid intake once starting soft, solid proteins 2 week post-bariatric surgery.   Daily Fluid intake: Daily Protein intake:  Concerns/issues:   Pt states she will email the dietitian because she is at work.

## 2019-03-27 ENCOUNTER — Telehealth: Payer: Self-pay | Admitting: Skilled Nursing Facility1

## 2019-03-27 NOTE — Telephone Encounter (Signed)
You can follow a more plant-based diet. Attached you will find plant-based protein options. As you meet your protein and fluid needs you can advance your diet unless you are experiencing nausea, vomiting or diarrhea.   From: Lennox Solders [email or web address]>  Sent: Tuesday, March 26, 2019 4:19 PM To: Acheron [email or web address]> Subject: [External Email]patient .Marland Kitchen AmyWashburn .. question  *Caution - External email - see footer for warnings* [name],  Thank you for calling to check up on me today. My question is with our low carb/high protein diet and cholesterol. When I have used this eating plan in the past, my LDL cholesterol tends to increase. I am trying to focus more on the beans/legumes instead of cheese, eggs and shrimp (my normal go-to proteins) this time around. Do you have any more suggestions before my daily protein oatmeal is added back to my diet? Thanks in advance!! .. Kitara Ragain

## 2019-04-03 ENCOUNTER — Telehealth: Payer: Self-pay | Admitting: Skilled Nursing Facility1

## 2019-04-03 NOTE — Telephone Encounter (Signed)
230 pm sounds good. [phone].  Also know that yesterday really encouraged me to come in for a 6 week visit. Are you all still seeing patients? ... Lennox Solders  On Wed, Apr 03, 2019, 12:58 PM Scotece, Alexis [email or web address]> wrote: Hey Colletta,   Would you like me to call you today at 2pm, 2:30pm, or 6pm?    From: Marieli Westfall [email or web address]>  Sent: Tuesday, April 02, 2019 1:36 PM To: Scotece, Alexis [email or web address]>; Williams-James, Dawn [email or web address]> Subject: [External Email]my angels today ....   *Caution - External email - see footer for warnings* The day has been so terribly stressful that I was really ready to grab a bottle of wine and a pack of Reese's cups -- on my way home for lunch. You will be happy to know that I had two angels on my shoulder (obviously named Jon Gills and [place]) convincing me not to follow the devil on my other shoulder and give in to any post-op WLS diet sins.      Phone Call:  Pt states she has been playing with her dogs to deal with stress. Pt states he is feeling much better with this surgery and having less emesis only vomiting once due to eating too much too fast. Pt states she is eating 1200 calories and 80 calories. Pt states she is feeling very low in energy and feels dizzy sometimes.   Pt states she has been eating Cottage cheese and other cheese with chicken and edamame with 64 minimum ounces a day.  Pt states she takes celebrate and calcium every day.  Dietitian advised pt to aim for a minimum of 70 ounces a day and eat dried beas throughout the week for more energy sources until she can add in other foods.

## 2019-04-03 NOTE — Telephone Encounter (Signed)
Hey [name]!  I highly apologize for the late response but my hours have been cut to part time due to Covid19 stuff.   Eating plant based is a great way to ensure your cholesterol is controlled from a dietary stand point. Attached you will find a list of plant based protein sources to focus on. Let me know if you would like me to call you as well.  From: Lennox Solders [email or web address]>  Sent: Tuesday, March 26, 2019 4:19 PM To: Scotece, Alexis [email or web address]> Subject: [External Email]patient .Marland Kitchen Susan Dorsey .. question  *Caution - External email - see footer for warnings* [name],  Thank you for calling to check up on me today. My question is with our low carb/high protein diet and cholesterol. When I have used this eating plan in the past, my LDL cholesterol tends to increase. I am trying to focus more on the beans/legumes instead of cheese, eggs and shrimp (my normal go-to proteins) this time around. Do you have any more suggestions before my daily protein oatmeal is added back to my diet? Thanks in advance!! .. Susan Dorsey

## 2019-04-30 ENCOUNTER — Other Ambulatory Visit: Payer: Self-pay

## 2019-04-30 ENCOUNTER — Encounter: Payer: 59 | Attending: General Surgery | Admitting: Skilled Nursing Facility1

## 2019-04-30 DIAGNOSIS — E669 Obesity, unspecified: Secondary | ICD-10-CM | POA: Insufficient documentation

## 2019-04-30 DIAGNOSIS — Z6841 Body Mass Index (BMI) 40.0 and over, adult: Principal | ICD-10-CM

## 2019-04-30 NOTE — Progress Notes (Signed)
Bariatric Follow-Up Visit 2 Months Medical Nutrition Therapy  Appt Start Time: 2:46 End Time: 3:32 Primary Concerns Today: Bariatric Surgery Nutrition Follow Up   Bariatric Surgery Type:  Sleeve from lap band  Surgery Date: 03/04/2019   NUTRITION ASSESSMENT   Anthropometrics  Start weight at [facility]: 271.7  lbs  Today's weight: 252  lbs   Body Composition Results: printer will not print  Fat%: 45.3 Water percent: 41.8   Psychosocial/Lifestyle   Pt states she does turn to food for stress. Pt states he has tried to have other things to do to deal with her stress like looming and cleaning. Pt states she cooks her meals for the week on Sunday. Pt states she is working a lot of hours at work. Pt states she finds Pinterest very helpful. Pt states her goal is to eat 3 meals. Pt state she threw up once from eating and drinking at the same time. Pt states her body reacts differently to food than most people. Pt states she hates plain water. Pt states she is not interested in doing any physical activity until the gym opens. Pt states she is gluten intolerant. Pt states she has constipation and diarrhea once a week; stating when she has diarrhea she feels she is going to throw up (this may be due to protein shakes; this will be tested). Pt states she wants fruit back in and feels she will do better and feel better with fruit in her diet. Pt states she wants to lose more weight and does not understand why she has not: Dietitian explained the importance of consistency citing her caloric range as an example.  Pt states the lap band was worth it to lose all that weight even with the painful emesis.   Medications: See list Labs:  Supplements:  Celebrate and calcium   24-Hr Dietary Recall: [id number] calories asccording to pt using MyFitness Pal First Meal: protein oatmeal with 1/2 cup cottage cheese  Snack: 1/4 dried chic peas Second Meal: 2 ounces chicken thighs or pork tenderloin or tofu with edamame or  riced caulifliwer Snack:  Third Meal: [place] lunch meat and cabbage/carrots or white garlic sauce on egg with cheese  Snack: greek yogurt with sugar free pudding mix or lemon bar  Beverages: sugar free flavored drinks, coffee, diet mountain dew no bubbles   Estimated Daily Fluid Intake:  70-80 oz Estimated Daily Protein Intake: 90 g   Physical Activity  Current average weekly physical activity: walking her dogs .5 mile    Signs/Symptoms  Using straws: no Drinking while eating: no Chewing/swallowing difficulties: no Changes in vision: no Changes to mood/headaches: no Hair loss/changes to skin/nails: no Difficulty focusing/concentrating: non Sweating: no Dizziness/lightheadedness: no Palpitations: no Carbonated/caffeinated beverages: no N/V/D/C/Gas: no Abdominal pain: no Dumping syndrome: no     NUTRITION DIAGNOSIS  Overweight/obesity ([place]-3.3) related to past poor dietary habits and physical inactivity as evidenced by patient w/ completed Bariatric surgery following dietary guidelines for continued weight loss and healthy nutrition status.     NUTRITION INTERVENTION Nutrition counseling (C-1) and education (E-2) to facilitate bariatric surgery goals, including:  Diet advancement to the next phase now including non-starchy vegetables  The importance of consuming adequate calories as well as certain nutrients daily due to the body's need for essential vitamins, minerals, and fats  The importance of daily physical activity and to reach a goal of at least 150 minutes of moderate to vigorous physical activity weekly (or as directed by their physician) due to benefits  such as increased musculature and improved lab values  Pt Chosen Goals: -3 times a week stretches  -Continue to aim for a minimum of 64 fluid ounces 7 days a week with at least 30 ounces being plain water -Eat non-starchy vegetables 2 times a day 7 days a week -Start out with soft cooked vegetables today and  tomorrow; if tolerated begin to eat raw vegetables or cooked including salads -Eat your 3 ounces of protein first then start in on your non-starchy vegetables; once you understand how much of your meal leads to satisfaction and not full while still eating 3 ounces of protein and non-starchy vegetables you can eat them in any order  -Continue to aim for 30 minutes of activity at least 5 times a week -Do NOT cook with/add to your food: alfredo sauce, cheese sauce, barbeque sauce, ketchup, fat back, butter, bacon grease, grease, Crisco    Readiness for Change: Ready but struggling with her relationship with food/her own body  Demonstrated degree of understanding via: Teach Back     MONITORING & EVALUATION Dietary intake, weekly physical activity, and body weight follow up in pt states she will call

## 2019-04-30 NOTE — Patient Instructions (Addendum)
-  Continue to aim for a minimum of 64 fluid ounces 7 days a week with at least 30 ounces being plain water  -Eat non-starchy vegetables 2 times a day 7 days a week  -Start out with soft cooked vegetables today and tomorrow; if tolerated begin to eat raw vegetables or cooked including salads  -Eat your 3 ounces of protein first then start in on your non-starchy vegetables; once you understand how much of your meal leads to satisfaction and not full while still eating 3 ounces of protein and non-starchy vegetables you can eat them in any order   -Continue to aim for 30 minutes of activity at least 5 times a week  -Do NOT cook with/add to your food: alfredo sauce, cheese sauce, barbeque sauce, ketchup, fat back, butter, bacon grease, grease, Crisco   3 times a week stretches

## 2019-05-02 ENCOUNTER — Ambulatory Visit: Payer: 59 | Admitting: Skilled Nursing Facility1

## 2019-07-03 ENCOUNTER — Telehealth: Payer: Self-pay | Admitting: Skilled Nursing Facility1

## 2019-07-03 NOTE — Telephone Encounter (Signed)
Hey Susan Dorsey,  I am sorry to hear that. Not knowing how you are currently eating or anything else you have going on I can only answer generally so...  Start with the easiest first which would probably be dairy (milk, cheese, yogurt, and ice cream)  If the diarrhea continues try ensuring you are not eating any high sugar foods such as: frappes/mochas/other coffee drinks, bottled drinks such as juice or tea, juice, regular soda, sweet tea, regular Gatorade or other sports drinks, cakes, cookies, pies, ice cream, granola bars, too much fruit at once, cereal, etc.  OR high fat foods/sauces such as: deep fried, gravy, alfredo, cheese sauce, high fat meats, oil, biscuits, etc. Then if it is still not getting any better communicate this with your surgeon.  There is some research drinking with meals could cause it as well.  If you are still struggling with stress eating that could do it too.   Make sure you are getting in a minimum of 80 fluid ounces to stay hydrated during your bouts of diarrhea.   I hope you find the culprit fast!  From: Susan Dorsey [email or web address]>  Sent: Wednesday, July 03, 2019 9:09 AM To: Susan Dorsey [email or web address]> Subject: [External Email]digestive issues ....  *Caution - External email - see footer for warnings* Good morning ....  Seeking advice for post-op sleeve issues. I've been having lower-bowel-cramping diarrhea lately. I probably get this 1-3 days a week and it feels so bad I've thrown up with it twice since my March 9 surgery. You earlier suggested I try giving up whey protein, which did make a difference. Reading around and talking to people, it sounds like the likely culprit(s) could be lactose, sugar or overeating.  Do you have an opinion or suggestion on which I should try first for an elimination diet?  I've lost about seven pounds since I saw you in early May doing the 1200 calories, 60-70g of protein most days. Slow and steady is OK.  I appreciate that advice.

## 2019-09-24 ENCOUNTER — Other Ambulatory Visit: Payer: Self-pay

## 2019-09-24 DIAGNOSIS — Z20822 Contact with and (suspected) exposure to covid-19: Secondary | ICD-10-CM

## 2019-09-25 LAB — NOVEL CORONAVIRUS, NAA: SARS-CoV-2, NAA: NOT DETECTED

## 2019-12-27 HISTORY — PX: CORNEAL TRANSPLANT: SHX108

## 2020-07-30 ENCOUNTER — Other Ambulatory Visit: Payer: Self-pay

## 2020-07-30 ENCOUNTER — Encounter (INDEPENDENT_AMBULATORY_CARE_PROVIDER_SITE_OTHER): Payer: Self-pay | Admitting: Family Medicine

## 2020-07-30 ENCOUNTER — Ambulatory Visit (INDEPENDENT_AMBULATORY_CARE_PROVIDER_SITE_OTHER): Payer: 59 | Admitting: Family Medicine

## 2020-07-30 VITALS — BP 121/81 | HR 60 | Temp 98.3°F | Ht 66.0 in | Wt 273.0 lb

## 2020-07-30 DIAGNOSIS — E559 Vitamin D deficiency, unspecified: Secondary | ICD-10-CM | POA: Diagnosis not present

## 2020-07-30 DIAGNOSIS — R0602 Shortness of breath: Secondary | ICD-10-CM | POA: Diagnosis not present

## 2020-07-30 DIAGNOSIS — R5383 Other fatigue: Secondary | ICD-10-CM

## 2020-07-30 DIAGNOSIS — E538 Deficiency of other specified B group vitamins: Secondary | ICD-10-CM

## 2020-07-30 DIAGNOSIS — D508 Other iron deficiency anemias: Secondary | ICD-10-CM

## 2020-07-30 DIAGNOSIS — I10 Essential (primary) hypertension: Secondary | ICD-10-CM

## 2020-07-30 DIAGNOSIS — Z6841 Body Mass Index (BMI) 40.0 and over, adult: Secondary | ICD-10-CM

## 2020-07-30 DIAGNOSIS — Z1331 Encounter for screening for depression: Secondary | ICD-10-CM | POA: Diagnosis not present

## 2020-07-30 DIAGNOSIS — Z9189 Other specified personal risk factors, not elsewhere classified: Secondary | ICD-10-CM | POA: Diagnosis not present

## 2020-07-30 DIAGNOSIS — Z0289 Encounter for other administrative examinations: Secondary | ICD-10-CM

## 2020-07-31 LAB — ANEMIA PANEL
Ferritin: 91 ng/mL (ref 15–150)
Folate, Hemolysate: 531 ng/mL
Folate, RBC: 1204 ng/mL (ref >498)
Hematocrit: 44.1 % (ref 34.0–46.6)
Iron Saturation: 26 % (ref 15–55)
Iron: 76 ug/dL (ref 27–159)
Retic Ct Pct: 1.4 % (ref 0.6–2.6)
Total Iron Binding Capacity: 294 ug/dL (ref 250–450)
UIBC: 218 ug/dL (ref 131–425)
Vitamin B-12: 1796 pg/mL — ABNORMAL HIGH (ref 232–1245)

## 2020-07-31 LAB — CBC WITH DIFFERENTIAL/PLATELET
Basophils Absolute: 0.1 10*3/uL (ref 0.0–0.2)
Basos: 1 %
EOS (ABSOLUTE): 0.1 10*3/uL (ref 0.0–0.4)
Eos: 1 %
Hemoglobin: 14.9 g/dL (ref 11.1–15.9)
Immature Grans (Abs): 0 10*3/uL (ref 0.0–0.1)
Immature Granulocytes: 0 %
Lymphocytes Absolute: 2.1 10*3/uL (ref 0.7–3.1)
Lymphs: 28 %
MCH: 32.3 pg (ref 26.6–33.0)
MCHC: 33.8 g/dL (ref 31.5–35.7)
MCV: 96 fL (ref 79–97)
Monocytes Absolute: 0.7 10*3/uL (ref 0.1–0.9)
Monocytes: 9 %
Neutrophils Absolute: 4.6 10*3/uL (ref 1.4–7.0)
Neutrophils: 61 %
Platelets: 245 10*3/uL (ref 150–450)
RBC: 4.62 x10E6/uL (ref 3.77–5.28)
RDW: 12.4 % (ref 11.7–15.4)
WBC: 7.5 10*3/uL (ref 3.4–10.8)

## 2020-07-31 LAB — HEMOGLOBIN A1C
Est. average glucose Bld gHb Est-mCnc: 103 mg/dL
Hgb A1c MFr Bld: 5.2 % (ref 4.8–5.6)

## 2020-07-31 LAB — COMPREHENSIVE METABOLIC PANEL
ALT: 19 IU/L (ref 0–32)
AST: 18 IU/L (ref 0–40)
Albumin/Globulin Ratio: 2 (ref 1.2–2.2)
Albumin: 4.3 g/dL (ref 3.8–4.9)
Alkaline Phosphatase: 99 IU/L (ref 48–121)
BUN/Creatinine Ratio: 14 (ref 9–23)
BUN: 12 mg/dL (ref 6–24)
Bilirubin Total: 0.5 mg/dL (ref 0.0–1.2)
CO2: 25 mmol/L (ref 20–29)
Calcium: 9.3 mg/dL (ref 8.7–10.2)
Chloride: 97 mmol/L (ref 96–106)
Creatinine, Ser: 0.85 mg/dL (ref 0.57–1.00)
GFR calc Af Amer: 88 mL/min/{1.73_m2} (ref >59)
GFR calc non Af Amer: 76 mL/min/{1.73_m2} (ref >59)
Globulin, Total: 2.2 g/dL (ref 1.5–4.5)
Glucose: 76 mg/dL (ref 65–99)
Potassium: 4 mmol/L (ref 3.5–5.2)
Sodium: 135 mmol/L (ref 134–144)
Total Protein: 6.5 g/dL (ref 6.0–8.5)

## 2020-07-31 LAB — TSH: TSH: 2.33 u[IU]/mL (ref 0.450–4.500)

## 2020-07-31 LAB — T3: T3, Total: 113 ng/dL (ref 71–180)

## 2020-07-31 LAB — INSULIN, RANDOM: INSULIN: 3.3 u[IU]/mL (ref 2.6–24.9)

## 2020-07-31 LAB — VITAMIN D 25 HYDROXY (VIT D DEFICIENCY, FRACTURES): Vit D, 25-Hydroxy: 45.8 ng/mL (ref 30.0–100.0)

## 2020-07-31 LAB — LIPID PANEL WITH LDL/HDL RATIO
Cholesterol, Total: 177 mg/dL (ref 100–199)
HDL: 57 mg/dL (ref >39)
LDL Chol Calc (NIH): 105 mg/dL — ABNORMAL HIGH (ref 0–99)
LDL/HDL Ratio: 1.8 ratio (ref 0.0–3.2)
Triglycerides: 82 mg/dL (ref 0–149)
VLDL Cholesterol Cal: 15 mg/dL (ref 5–40)

## 2020-07-31 LAB — FOLATE: Folate: >20 ng/mL (ref >3.0)

## 2020-07-31 LAB — T4, FREE: Free T4: 1.51 ng/dL (ref 0.82–1.77)

## 2020-08-03 NOTE — Progress Notes (Signed)
Chief Complaint:   OBESITY Susan Dorsey (MR# [id number]) is a 59 y.o. female who presents for evaluation and treatment of obesity and related comorbidities. Current BMI is Body mass index is 44.06 kg/m. Susan Dorsey has been struggling with her weight for many years and has been unsuccessful in either losing weight, maintaining weight loss, or reaching her healthy weight goal.  Susan Dorsey is currently in the action stage of change and ready to dedicate time achieving and maintaining a healthier weight. Susan Dorsey is interested in becoming our patient and working on intensive lifestyle modifications including (but not limited to) diet and exercise for weight loss.  Susan Dorsey was here previously, and was last seen greater than 1 year ago by Dr. Dalbert Dorsey.  She is status post lap band surgery in 2008 and gastric sleeve in 2020 by Dr. Daphine Dorsey.  She lives with Susan Dorsey, her husband.  She craves pizza, candy, and nuts.  She works at the [employer].  She exercises for about an hour 3 days per week.  Susan Dorsey's habits were reviewed today and are as follows: Her family eats meals together, she thinks her family will eat healthier with her, her desired weight loss is 75 pounds, she has been heavy most of her life, she started gaining weight in high school, her heaviest weight ever was 380 pounds, she craves pizza, fruit, candy, and nuts, she snacks frequently in the evenings and she struggles with emotional eating.  Depression Screen Susan Dorsey's Food and Mood (modified PHQ-9) score was 5.  Depression screen Susan Dorsey 2/9 07/30/2020  Decreased Interest 0  Down, Depressed, Hopeless 0  PHQ - 2 Score 0  Altered sleeping 1  Tired, decreased energy 2  Change in appetite 1  Feeling bad or failure about yourself  0  Trouble concentrating 0  Moving slowly or fidgety/restless 1  Suicidal thoughts 0  PHQ-9 Score 5  Difficult doing work/chores Not difficult at all   Subjective:   1. Other fatigue Susan Dorsey denies daytime somnolence and denies waking  up still tired. Patent has a history of symptoms of snoring. Susan Dorsey generally gets 8 or 9 hours of sleep per night, and states that she has generally restful sleep. Snoring is present. Apneic episodes are not present. Epworth Sleepiness Score is 1.  2. Shortness of breath on exertion Susan Dorsey notes increasing shortness of breath with exercising and seems to be worsening over time with weight gain. She notes getting out of breath sooner with activity than she used to. This has gotten worse recently. Susan Dorsey denies shortness of breath at rest or orthopnea.  3. Essential hypertension Review: taking medications as instructed, no medication side effects noted, no chest pain on exertion, no dyspnea on exertion, no swelling of ankles.  Now diet-controlled and at goal.  She was on medications prior to gastric sleeve.  None for many months.  BP Readings from Last 3 Encounters:  07/30/20 121/81  03/05/19 (!) 153/78  02/27/19 (!) 158/88   4. B12 nutritional deficiency She notes fatigue. She is not a vegetarian.  She does not have a previous diagnosis of pernicious anemia.  She has a history of weight loss surgery.  She is taking a bariatric multivitamin.  Lab Results  Component Value Date   VITAMINB12 1,796 (H) 07/30/2020   5. Vitamin D deficiency Susan Dorsey's Vitamin D level was 45.5 on 09/13/2018. She is currently taking a bariatric multivitamin. She denies nausea, vomiting or muscle weakness.  6. Other iron deficiency anemia Susan Dorsey is not a vegetarian.  She has a history of weight loss surgery.  She takes a bariatric multivitamin daily.  CBC Latest Ref Rng & Units 07/30/2020 03/05/2019 03/04/2019  WBC 3.4 - 10.8 x10E3/uL 7.5 12.7(H) -  Hemoglobin 11.1 - 15.9 g/dL 34.7 42.5 15.4(H)  Hematocrit 34.0 - 46.6 % 44.1 43.0 48.7(H)  Platelets 150 - 450 x10E3/uL 245 219 -   Lab Results  Component Value Date   IRON 76 07/30/2020   TIBC 294 07/30/2020   FERRITIN 91 07/30/2020   Lab Results  Component Value Date    VITAMINB12 1,796 (H) 07/30/2020   7. At risk for impaired metabolic function Susan Dorsey is at increased risk for impaired metabolic function due to current nutrition and muscle mass.  8. Depression screening Susan Dorsey was screened for depression as part of her Susan patient workup today.  Assessment/Plan:   1. Other fatigue Susan Dorsey does feel that her weight is causing her energy to be lower than it should be. Fatigue may be related to obesity, depression or many other causes. Labs will be ordered, and in the meanwhile, Susan Dorsey will focus on self care including making healthy food choices, increasing physical activity and focusing on stress reduction. - EKG 12-Lead - Anemia panel - Comprehensive metabolic panel - T3 - T4, free - TSH - Hemoglobin A1c - Insulin, random - Folate  2. Shortness of breath on exertion Susan Dorsey does feel that she gets out of breath more easily that she used to when she exercises. Susan Dorsey's shortness of breath appears to be obesity related and exercise induced. She has agreed to work on weight loss and gradually increase exercise to treat her exercise induced shortness of breath. Will continue to monitor closely. - Comprehensive metabolic panel - Lipid Panel With LDL/HDL Ratio  3. Essential hypertension Susan Dorsey is working on healthy weight loss and exercise to improve blood pressure control. We will watch for signs of hypotension as she continues her lifestyle modifications. - Comprehensive metabolic panel  4. B12 nutritional deficiency The diagnosis was reviewed with the patient. Counseling provided today, see below. We will continue to monitor. Orders and follow up as documented in patient record.  Counseling . The body needs vitamin B12: to make red blood cells; to make DNA; and to help the nerves work properly so they can carry messages from the brain to the body.  . The main causes of vitamin B12 deficiency include dietary deficiency, digestive diseases, pernicious anemia, and having a  surgery in which part of the stomach or small intestine is removed.  . Certain medicines can make it harder for the body to absorb vitamin B12. These medicines include: heartburn medications; some antibiotics; some medications used to treat diabetes, gout, and high cholesterol.  . In some cases, there are no symptoms of this condition. If the condition leads to anemia or nerve damage, various symptoms can occur, such as weakness or fatigue, shortness of breath, and numbness or tingling in your hands and feet.   . Treatment:  o May include taking vitamin B12 supplements.  o Avoid alcohol.  o Eat lots of healthy foods that contain vitamin B12: - Beef, pork, chicken, [place], and organ meats, such as liver.  - Seafood: This includes clams, rainbow trout, salmon, tuna, and haddock. Eggs.  - Cereal and dairy products that are fortified: This means that vitamin B12 has been added to the food.  - Vitamin B12  5. Vitamin D deficiency Low Vitamin D level contributes to fatigue and are associated with obesity, breast, and  colon cancer.  - VITAMIN D 25 Hydroxy (Vit-D Deficiency, Fractures)  6. Other iron deficiency anemia Orders and follow up as documented in patient record.  Counseling . Iron is essential for our bodies to make red blood cells.  Reasons that someone may be deficient include: an iron-deficient diet (more likely in those following vegan or vegetarian diets), women with heavy menses, patients with GI disorders or poor absorption, patients that have had bariatric surgery, frequent blood donors, patients with cancer, and patients with heart disease.   Gaspar Cola. Iron-rich foods include dark leafy greens, red and white meats, eggs, seafood, and beans.   . Certain foods and drinks prevent your body from absorbing iron properly. Avoid eating these foods in the same meal as iron-rich foods or with iron supplements. These foods include: coffee, black tea, and red wine; milk, dairy products, and foods that  are high in calcium; beans and soybeans; whole grains.  . Constipation can be a side effect of iron supplementation. Increased water and fiber intake are helpful. Water goal: > 2 liters/day. Fiber goal: > 25 grams/day. - CBC with Differential/Platelet  7. At risk for impaired metabolic function Tanairi was given approximately 15 minutes of impaired  metabolic function prevention counseling today. We discussed intensive lifestyle modifications today with an emphasis on specific nutrition and exercise instructions and strategies.   Repetitive spaced learning was employed today to elicit superior memory formation and behavioral change.  8. Depression screening Hartleigh had a positive depression screening. Depression is commonly associated with obesity and often results in emotional eating behaviors. We will monitor this closely and work on CBT to help improve the non-hunger eating patterns. Referral to Psychology may be required if no improvement is seen as she continues in our clinic.  9. Class 3 severe obesity with serious comorbidity and body mass index (BMI) of 40.0 to 44.9 in adult, unspecified obesity type (HCC) Statia is currently in the action stage of change and her goal is to continue with weight loss efforts. I recommend Anushree begin the structured treatment plan as follows:  She has agreed to the Category 2 Plan.  Exercise goals: As is.   Behavioral modification strategies: increasing lean protein intake, decreasing simple carbohydrates, meal planning and cooking strategies and planning for success.  She was informed of the importance of frequent follow-up visits to maximize her success with intensive lifestyle modifications for her multiple health conditions. She was informed we would discuss her lab results at her next visit unless there is a critical issue that needs to be addressed sooner. Ruberta agreed to keep her next visit at the agreed upon time to discuss these results.  Objective:   Blood  pressure 121/81, pulse 60, temperature 98.3 F (36.8 C), height 5\' 6"  (1.676 m), weight 273 lb (123.8 kg), last menstrual period 02/15/2020, SpO2 99 %. Body mass index is 44.06 kg/m.  EKG: Normal sinus rhythm, rate 60 bpm.  Indirect Calorimeter completed today shows a VO2 of 278 and a REE of 1933.  Her calculated basal metabolic rate is [id number] thus her basal metabolic rate is better than expected.  General: Cooperative, alert, well developed, in no acute distress. HEENT: Conjunctivae and lids unremarkable. Cardiovascular: Regular rhythm.  Lungs: Normal work of breathing. Neurologic: No focal deficits.   Lab Results  Component Value Date   CREATININE 0.85 07/30/2020   BUN 12 07/30/2020   NA 135 07/30/2020   K 4.0 07/30/2020   CL 97 07/30/2020   CO2 25 07/30/2020  Lab Results  Component Value Date   ALT 19 07/30/2020   AST 18 07/30/2020   ALKPHOS 99 07/30/2020   BILITOT 0.5 07/30/2020   Lab Results  Component Value Date   HGBA1C 5.2 07/30/2020   HGBA1C 5.1 09/13/2018   HGBA1C 5.2 01/08/2018   HGBA1C 5.1 07/14/2017   Lab Results  Component Value Date   INSULIN 3.3 07/30/2020   INSULIN 5.3 09/13/2018   INSULIN 5.6 01/08/2018   INSULIN 6.0 07/14/2017   Lab Results  Component Value Date   TSH 2.330 07/30/2020   Lab Results  Component Value Date   CHOL 177 07/30/2020   HDL 57 07/30/2020   LDLCALC 105 (H) 07/30/2020   TRIG 82 07/30/2020   Lab Results  Component Value Date   WBC 7.5 07/30/2020   HGB 14.9 07/30/2020   HCT 44.1 07/30/2020   MCV 96 07/30/2020   PLT 245 07/30/2020   Lab Results  Component Value Date   IRON 76 07/30/2020   TIBC 294 07/30/2020   FERRITIN 91 07/30/2020   Attestation Statements:   Reviewed by clinician on day of visit: allergies, medications, problem list, medical history, surgical history, family history, social history, and previous encounter notes.  I, Insurance claims handler, CMA, am acting as Energy manager for [employer],  DO.  I have reviewed the above documentation for accuracy and completeness, and I agree with the above. Thomasene Lot, DO

## 2020-08-13 ENCOUNTER — Other Ambulatory Visit: Payer: Self-pay

## 2020-08-13 ENCOUNTER — Encounter (INDEPENDENT_AMBULATORY_CARE_PROVIDER_SITE_OTHER): Payer: Self-pay | Admitting: Family Medicine

## 2020-08-13 ENCOUNTER — Ambulatory Visit (INDEPENDENT_AMBULATORY_CARE_PROVIDER_SITE_OTHER): Payer: 59 | Admitting: Family Medicine

## 2020-08-13 VITALS — BP 160/92 | HR 58 | Temp 97.9°F | Ht 66.0 in | Wt 273.0 lb

## 2020-08-13 DIAGNOSIS — E7849 Other hyperlipidemia: Secondary | ICD-10-CM | POA: Diagnosis not present

## 2020-08-13 DIAGNOSIS — Z9189 Other specified personal risk factors, not elsewhere classified: Secondary | ICD-10-CM | POA: Diagnosis not present

## 2020-08-13 DIAGNOSIS — I10 Essential (primary) hypertension: Secondary | ICD-10-CM

## 2020-08-13 DIAGNOSIS — E538 Deficiency of other specified B group vitamins: Secondary | ICD-10-CM | POA: Diagnosis not present

## 2020-08-13 DIAGNOSIS — Z6841 Body Mass Index (BMI) 40.0 and over, adult: Secondary | ICD-10-CM

## 2020-08-13 DIAGNOSIS — E559 Vitamin D deficiency, unspecified: Secondary | ICD-10-CM | POA: Diagnosis not present

## 2020-08-13 MED ORDER — VITAMIN D (ERGOCALCIFEROL) 1.25 MG (50000 UNIT) PO CAPS: ORAL_CAPSULE | ORAL | 0 refills | Status: DC

## 2020-08-17 NOTE — Progress Notes (Signed)
Chief Complaint:   OBESITY Susan Dorsey is here to discuss her progress with her obesity treatment plan along with follow-up of her obesity related diagnoses. Susan Dorsey is on the Category 2 Plan and states she is following her eating plan approximately 85% of the time. Susan Dorsey states she is  Doing the HOPE program for 60 minutes 3 times per week.  Today's visit was #: 2 Starting weight: 273 lbs Starting date: 07/30/2020 Today's weight: 273 lbs Today's date: 08/13/2020 Total lbs lost to date: 0 Total lbs lost since last in-office visit: 0  Interim History: This is Susan Dorsey's first follow-up visit with me.  She is overwhelmed by not being able to use butter or dressings, etc.  She says she was out of town for 2 days last week.  She says she did not eat bread, and instead of bread, she ate oatmeal.  On Monday, she started to use MFP.  She averages around 1500 calories per day with 100 grams or so of protein.  Subjective:   1. Essential hypertension Review: taking medications as instructed, no medication side effects noted, no chest pain on exertion, no dyspnea on exertion, no swelling of ankles.  Blood pressure is increased from prior. Diet controlled currently.  Per patient, well-controlled at home (usually 120s/70-80).  No complaints or symptoms.  She says she felt stressed from work today.  BP Readings from Last 3 Encounters:  08/13/20 (!) 160/92  07/30/20 121/81  03/05/19 (!) 153/78   2. Vitamin B 12 deficiency She is not a vegetarian.  She does not have a previous diagnosis of pernicious anemia.  She does not have a history of weight loss surgery.  B12 is slightly elevated.  She is taking a daily supplement.  Lab Results  Component Value Date   VITAMINB12 1,796 (H) 07/30/2020   3. Vitamin D deficiency Susan Dorsey's Vitamin D level was 45.8 on 07/30/2020. She is currently taking no vitamin D supplement. She denies nausea, vomiting or muscle weakness.  4. Other hyperlipidemia Susan Dorsey has hyperlipidemia and has  been trying to improve her cholesterol levels with intensive lifestyle modification including a low saturated fat diet, exercise and weight loss. She denies any chest pain, claudication or myalgias.  Mildly elevated LDL.  HDL 56.  Per patient, HDL used to be in the single digits.  Lab Results  Component Value Date   ALT 19 07/30/2020   AST 18 07/30/2020   ALKPHOS 99 07/30/2020   BILITOT 0.5 07/30/2020   Lab Results  Component Value Date   CHOL 177 07/30/2020   HDL 57 07/30/2020   LDLCALC 105 (H) 07/30/2020   TRIG 82 07/30/2020   5. At risk for impaired metabolic function Susan Dorsey is at increased risk for impaired metabolic function due to current nutrition and muscle mass and her being status post weight loss surgery.  Assessment/Plan:   1. Essential hypertension Worsening.  Discussed labs with patient today.  Susan Dorsey is working on healthy weight loss and exercise to improve blood pressure control. We will watch for signs of hypotension as she continues her lifestyle modifications.  Check blood pressure daily and keep a log.  Bring log in to next office visit.  Continue prudent nutritional plan, weight loss, and low salt diet.  2. Vitamin B 12 deficiency Discussed labs with patient today.  The diagnosis was reviewed with the patient. Counseling provided today, see below. We will continue to monitor. Orders and follow up as documented in patient record.  Continue bariatric multivitamin.  Counseling . The body needs vitamin B12: to make red blood cells; to make DNA; and to help the nerves work properly so they can carry messages from the brain to the body.  . The main causes of vitamin B12 deficiency include dietary deficiency, digestive diseases, pernicious anemia, and having a surgery in which part of the stomach or small intestine is removed.  . Certain medicines can make it harder for the body to absorb vitamin B12. These medicines include: heartburn medications; some antibiotics; some  medications used to treat diabetes, gout, and high cholesterol.  . In some cases, there are no symptoms of this condition. If the condition leads to anemia or nerve damage, various symptoms can occur, such as weakness or fatigue, shortness of breath, and numbness or tingling in your hands and feet.   . Treatment:  o May include taking vitamin B12 supplements.  o Avoid alcohol.  o Eat lots of healthy foods that contain vitamin B12: - Beef, pork, chicken, [place], and organ meats, such as liver.  - Seafood: This includes clams, rainbow trout, salmon, tuna, and haddock. Eggs.  - Cereal and dairy products that are fortified: This means that vitamin B12 has been added to the food.   3. Vitamin D deficiency Discussed labs with patient today. Low Vitamin D level contributes to fatigue and are associated with obesity, breast, and colon cancer. She agrees to start to take prescription Vitamin D @50 ,000 IU twice monthly and will follow-up for routine testing of Vitamin D, at least 2-3 times per year to avoid over-replacement.  - Start ergocalciferol 50,000 IU by mouth every 14 days.  #2, 0 refills.  4. Other hyperlipidemia Discussed labs with patient today.  Cardiovascular risk and specific lipid/LDL goals reviewed.  We discussed several lifestyle modifications today and Susan Dorsey will continue to work on diet, exercise and weight loss efforts. Orders and follow up as documented in patient record.     Counseling Intensive lifestyle modifications are the first line treatment for this issue. . Dietary changes: Increase soluble fiber. Decrease simple carbohydrates. . Exercise changes: Moderate to vigorous-intensity aerobic activity 150 minutes per week if tolerated. . Lipid-lowering medications: see documented in medical record.  5. At risk for impaired metabolic function Susan Dorsey was given approximately 15 minutes of impaired  metabolic function prevention counseling today. We discussed intensive lifestyle  modifications today with an emphasis on specific nutrition and exercise instructions and strategies.   Repetitive spaced learning was employed today to elicit superior memory formation and behavioral change.  6. Class 3 severe obesity with serious comorbidity and body mass index (BMI) of 40.0 to 44.9 in adult, unspecified obesity type (HCC) Susan Dorsey is currently in the action stage of change. As such, her goal is to continue with weight loss efforts. She has agreed to the Category 2 Plan.   Exercise goals: As is.  Behavioral modification strategies: increasing lean protein intake, meal planning and cooking strategies, better snacking choices and planning for success.  Susan Dorsey has agreed to follow-up with our clinic in 2 weeks. She was informed of the importance of frequent follow-up visits to maximize her success with intensive lifestyle modifications for her multiple health conditions.   Objective:   Blood pressure (!) 160/92, pulse (!) 58, temperature 97.9 F (36.6 C), height 5\' 6"  (1.676 m), weight 273 lb (123.8 kg), SpO2 96 %. Body mass index is 44.06 kg/m.  General: Cooperative, alert, well developed, in no acute distress. HEENT: Conjunctivae and lids unremarkable. Cardiovascular: Regular rhythm.  Lungs: Normal work of breathing. Neurologic: No focal deficits.   Lab Results  Component Value Date   CREATININE 0.85 07/30/2020   BUN 12 07/30/2020   NA 135 07/30/2020   K 4.0 07/30/2020   CL 97 07/30/2020   CO2 25 07/30/2020   Lab Results  Component Value Date   ALT 19 07/30/2020   AST 18 07/30/2020   ALKPHOS 99 07/30/2020   BILITOT 0.5 07/30/2020   Lab Results  Component Value Date   HGBA1C 5.2 07/30/2020   HGBA1C 5.1 09/13/2018   HGBA1C 5.2 01/08/2018   HGBA1C 5.1 07/14/2017   Lab Results  Component Value Date   INSULIN 3.3 07/30/2020   INSULIN 5.3 09/13/2018   INSULIN 5.6 01/08/2018   INSULIN 6.0 07/14/2017   Lab Results  Component Value Date   TSH 2.330 07/30/2020    Lab Results  Component Value Date   CHOL 177 07/30/2020   HDL 57 07/30/2020   LDLCALC 105 (H) 07/30/2020   TRIG 82 07/30/2020   Lab Results  Component Value Date   WBC 7.5 07/30/2020   HGB 14.9 07/30/2020   HCT 44.1 07/30/2020   MCV 96 07/30/2020   PLT 245 07/30/2020   Lab Results  Component Value Date   IRON 76 07/30/2020   TIBC 294 07/30/2020   FERRITIN 91 07/30/2020   Attestation Statements:   Reviewed by clinician on day of visit: allergies, medications, problem list, medical history, surgical history, family history, social history, and previous encounter notes.  I, Insurance claims handler, CMA, am acting as Energy manager for [employer], DO.  I have reviewed the above documentation for accuracy and completeness, and I agree with the above. Thomasene Lot, DO

## 2020-08-26 ENCOUNTER — Other Ambulatory Visit: Payer: Self-pay

## 2020-08-26 ENCOUNTER — Encounter (INDEPENDENT_AMBULATORY_CARE_PROVIDER_SITE_OTHER): Payer: Self-pay | Admitting: Family Medicine

## 2020-08-26 ENCOUNTER — Ambulatory Visit (INDEPENDENT_AMBULATORY_CARE_PROVIDER_SITE_OTHER): Payer: 59 | Admitting: Family Medicine

## 2020-08-26 VITALS — BP 142/84 | HR 65 | Temp 98.5°F | Ht 66.0 in | Wt 272.0 lb

## 2020-08-26 DIAGNOSIS — Z9189 Other specified personal risk factors, not elsewhere classified: Secondary | ICD-10-CM

## 2020-08-26 DIAGNOSIS — E538 Deficiency of other specified B group vitamins: Secondary | ICD-10-CM | POA: Diagnosis not present

## 2020-08-26 DIAGNOSIS — I10 Essential (primary) hypertension: Secondary | ICD-10-CM

## 2020-08-26 DIAGNOSIS — Z9884 Bariatric surgery status: Secondary | ICD-10-CM

## 2020-08-26 DIAGNOSIS — Z6841 Body Mass Index (BMI) 40.0 and over, adult: Secondary | ICD-10-CM

## 2020-08-26 DIAGNOSIS — E559 Vitamin D deficiency, unspecified: Secondary | ICD-10-CM

## 2020-08-26 MED ORDER — VITAMIN D (ERGOCALCIFEROL) 1.25 MG (50000 UNIT) PO CAPS: ORAL_CAPSULE | ORAL | 0 refills | Status: DC

## 2020-08-27 NOTE — Progress Notes (Signed)
Chief Complaint:   OBESITY Susan Dorsey is here to discuss her progress with her obesity treatment plan along with follow-up of her obesity related diagnoses. Susan Dorsey is on the Category 2 Plan and states she is following her eating plan approximately 60% of the time. Susan Dorsey states she is biking and doing group exercise for 60 minutes 3 times per week.  Today's visit was #: 3 Starting weight: 273 lbs Starting date: 07/30/2020 Today's weight: 272 lbs Today's date: 08/26/2020 Total lbs lost to date: 1 lb Total lbs lost since last in-office visit: 1 lb  Interim History: Susan Dorsey says she is happily surprised that sh elsot weight due to increased stress at work.  She was doing really well, especially until this past weekend due to stress at work.  Cravings are well-controlled, as is hunger, due to vagotomy along with lap band.  Subjective:   1. Essential hypertension diet controlled Review: taking medications as instructed, no medication side effects noted, no chest pain on exertion, no dyspnea on exertion, no swelling of ankles.  Well controlled at home.  She checks daily and 117/66, 122/67, 124/81, 127/84, 125/70, 124/67, 135/81 is highest.  She is diet-controlled.  BP Readings from Last 3 Encounters:  08/26/20 (!) 142/84  08/13/20 (!) 160/92  07/30/20 121/81   2. Vitamin B 12 deficiency She is not a vegetarian.  She does not have a previous diagnosis of pernicious anemia.  She has a history of weight loss surgery. History of gastric sleeve surgery in 2020 by Dr. Daphine Deutscher.  Lab Results  Component Value Date   VITAMINB12 1,796 (H) 07/30/2020   3. Vitamin D deficiency Susan Dorsey's Vitamin D level was 45.8 on 07/30/2020. She is currently taking prescription vitamin D 50,000 IU each week. She denies nausea, vomiting or muscle weakness.    4. Lapband APS trucal vagotomy Jan 2008 Susan Dorsey had lap band surgery with vagotomy in January of 2008,  5. S/P laparoscopic sleeve gastrectomy Susan Dorsey is status post laparoscopic  sleeve gastrectomy in 02/2019.  6. At risk for impaired metabolic function Susan Dorsey is at increased risk for impaired metabolic function due to current nutrition and muscle mass.  Assessment/Plan:   1. Essential hypertension diet controlled Susan Dorsey is working on healthy weight loss and exercise to improve blood pressure control. We will watch for signs of hypotension as she continues her lifestyle modifications.  Continue monitoring at home once weekly.  Continue prudent nutritional plan, weight loss.  2. Vitamin B 12 deficiency The diagnosis was reviewed with the patient. Counseling provided today, see below. We will continue to monitor. Orders and follow up as documented in patient record.  Continue supplement.  Counseling . The body needs vitamin B12: to make red blood cells; to make DNA; and to help the nerves work properly so they can carry messages from the brain to the body.  . The main causes of vitamin B12 deficiency include dietary deficiency, digestive diseases, pernicious anemia, and having a surgery in which part of the stomach or small intestine is removed.  . Certain medicines can make it harder for the body to absorb vitamin B12. These medicines include: heartburn medications; some antibiotics; some medications used to treat diabetes, gout, and high cholesterol.  . In some cases, there are no symptoms of this condition. If the condition leads to anemia or nerve damage, various symptoms can occur, such as weakness or fatigue, shortness of breath, and numbness or tingling in your hands and feet.   . Treatment:  o  May include taking vitamin B12 supplements.  o Avoid alcohol.  o Eat lots of healthy foods that contain vitamin B12: - Beef, pork, chicken, [place], and organ meats, such as liver.  - Seafood: This includes clams, rainbow trout, salmon, tuna, and haddock. Eggs.  - Cereal and dairy products that are fortified: This means that vitamin B12 has been added to the food.   3. Vitamin D  deficiency Low Vitamin D level contributes to fatigue and are associated with obesity, breast, and colon cancer. She agrees to continue to take prescription Vitamin D @50 ,000 IU every week and will follow-up for routine testing of Vitamin D, at least 2-3 times per year to avoid over-replacement.  She is on vitamin D for her level not being at goal.  Recheck levels after 3 months of supplementation.  Continue .pnp and weight loss.  -Refill Vitamin D, Ergocalciferol, (DRISDOL) 1.25 MG (50000 UNIT) CAPS capsule; Take one tablet twice monthly  Dispense: 2 capsule; Refill: 0  4. Lapband APS trucal vagotomy Jan 2008 Will continue to monitor.  5. S/P laparoscopic sleeve gastrectomy Continue prudent nutritional plan and weight loss.  6. At risk for impaired metabolic function Susan Dorsey was given approximately 15 minutes of impaired  metabolic function prevention counseling today. We discussed intensive lifestyle modifications today with an emphasis on specific nutrition and exercise instructions and strategies.   7. Class 3 severe obesity with serious comorbidity and body mass index (BMI) of 40.0 to 44.9 in adult, unspecified obesity type (HCC) Susan Dorsey is currently in the action stage of change. As such, her goal is to continue with weight loss efforts. She has agreed to the Category 2 Plan.   Exercise goals: As is.  Behavioral modification strategies: increasing lean protein intake, meal planning and cooking strategies, keeping healthy foods in the home and planning for success.  Susan Dorsey has agreed to follow-up with our clinic in 2 weeks. She was informed of the importance of frequent follow-up visits to maximize her success with intensive lifestyle modifications for her multiple health conditions.   Objective:   Blood pressure (!) 142/84, pulse 65, temperature 98.5 F (36.9 C), height 5\' 6"  (1.676 m), weight 272 lb (123.4 kg), SpO2 98 %. Body mass index is 43.9 kg/m.  General: Cooperative, alert, well  developed, in no acute distress. HEENT: Conjunctivae and lids unremarkable. Cardiovascular: Regular rhythm.  Lungs: Normal work of breathing. Neurologic: No focal deficits.   Lab Results  Component Value Date   CREATININE 0.85 07/30/2020   BUN 12 07/30/2020   NA 135 07/30/2020   K 4.0 07/30/2020   CL 97 07/30/2020   CO2 25 07/30/2020   Lab Results  Component Value Date   ALT 19 07/30/2020   AST 18 07/30/2020   ALKPHOS 99 07/30/2020   BILITOT 0.5 07/30/2020   Lab Results  Component Value Date   HGBA1C 5.2 07/30/2020   HGBA1C 5.1 09/13/2018   HGBA1C 5.2 01/08/2018   HGBA1C 5.1 07/14/2017   Lab Results  Component Value Date   INSULIN 3.3 07/30/2020   INSULIN 5.3 09/13/2018   INSULIN 5.6 01/08/2018   INSULIN 6.0 07/14/2017   Lab Results  Component Value Date   TSH 2.330 07/30/2020   Lab Results  Component Value Date   CHOL 177 07/30/2020   HDL 57 07/30/2020   LDLCALC 105 (H) 07/30/2020   TRIG 82 07/30/2020   Lab Results  Component Value Date   WBC 7.5 07/30/2020   HGB 14.9 07/30/2020   HCT 44.1  07/30/2020   MCV 96 07/30/2020   PLT 245 07/30/2020   Lab Results  Component Value Date   IRON 76 07/30/2020   TIBC 294 07/30/2020   FERRITIN 91 07/30/2020   Attestation Statements:   Reviewed by clinician on day of visit: allergies, medications, problem list, medical history, surgical history, family history, social history, and previous encounter notes.  I, Insurance claims handler, CMA, am acting as Energy manager for [employer], DO.  I have reviewed the above documentation for accuracy and completeness, and I agree with the above. Thomasene Lot, DO

## 2020-09-09 ENCOUNTER — Ambulatory Visit (INDEPENDENT_AMBULATORY_CARE_PROVIDER_SITE_OTHER): Payer: 59 | Admitting: Family Medicine

## 2020-09-09 ENCOUNTER — Other Ambulatory Visit: Payer: Self-pay

## 2020-09-09 ENCOUNTER — Encounter (INDEPENDENT_AMBULATORY_CARE_PROVIDER_SITE_OTHER): Payer: Self-pay | Admitting: Family Medicine

## 2020-09-09 VITALS — BP 131/83 | HR 60 | Temp 98.3°F | Ht 66.0 in | Wt 278.0 lb

## 2020-09-09 DIAGNOSIS — E559 Vitamin D deficiency, unspecified: Secondary | ICD-10-CM | POA: Diagnosis not present

## 2020-09-09 DIAGNOSIS — G479 Sleep disorder, unspecified: Secondary | ICD-10-CM

## 2020-09-09 DIAGNOSIS — Z6841 Body Mass Index (BMI) 40.0 and over, adult: Secondary | ICD-10-CM | POA: Diagnosis not present

## 2020-09-09 MED ORDER — VITAMIN D (ERGOCALCIFEROL) 1.25 MG (50000 UNIT) PO CAPS: ORAL_CAPSULE | ORAL | 0 refills | Status: DC

## 2020-09-14 NOTE — Progress Notes (Signed)
Chief Complaint:   OBESITY Susan Dorsey is here to discuss her progress with her obesity treatment plan along with follow-up of her obesity related diagnoses. Susan Dorsey is on the Category 2 Plan and states she is following her eating plan approximately 50% of the time. Susan Dorsey states she is doing boot camp for 60 minutes 3 times per week.  Today's visit was #: 4 Starting weight: 273 lbs Starting date: 07/30/2020 Today's weight: 278 lbs Today's date: 09/09/2020 Total lbs lost to date: 0 Total lbs lost since last in-office visit: 0  Interim History: Susan Dorsey says she is not sleeping well because her schedule keeps changing at work and she cannot keep a steady time for sleep/wake cycles.  Her boss is causing a lot of stress for her.  She feels constantly harassed.  Subjective:   1. Difficulty sleeping Due to stress and change in schedule.  Declines medication for stress and states she is taking steps to change jobs/looking.  Denies need for mood medication or sleep medication.  2. Vitamin D deficiency Susan Dorsey's Vitamin D level was 45.8 on 07/30/2020. She is currently taking prescription vitamin D 50,000 IU each week. She denies nausea, vomiting or muscle weakness.  Tolerating well.  Assessment/Plan:   1. Difficulty sleeping Continue melatonin.  Recheck mood in 2 weeks and will follow closely her stress levels and sleep patterns.  We discussed sleep hygiene.   2. Vitamin D deficiency Low Vitamin D level contributes to fatigue and are associated with obesity, breast, and colon cancer. She agrees to continue to take prescription Vitamin D @50 ,000 IU every week and will follow-up for routine testing of Vitamin D, at least 2-3 times per year to avoid over-replacement.  Recheck vitamin D level in 3 months or so.  -Refill Vitamin D, Ergocalciferol, (DRISDOL) 1.25 MG (50000 UNIT) CAPS capsule; Take one tablet twice monthly  Dispense: 2 capsule; Refill: 0  3. Class 3 severe obesity with serious comorbidity and body mass  index (BMI) of 40.0 to 44.9 in adult, unspecified obesity type (HCC) Susan Dorsey is currently in the action stage of change. As such, her goal is to continue with weight loss efforts. She has agreed to the Category 2 Plan.   Exercise goals: As is.  Behavioral modification strategies: increasing lean protein intake, meal planning and cooking strategies and planning for success.  Plan:  We discussed stress management techniques/sleep hygiene techniques and will follow closely.  Susan Dorsey has agreed to follow-up with our clinic in 2 weeks. She was informed of the importance of frequent follow-up visits to maximize her success with intensive lifestyle modifications for her multiple health conditions.   Objective:   Blood pressure 131/83, pulse 60, temperature 98.3 F (36.8 C), height 5\' 6"  (1.676 m), weight 278 lb (126.1 kg), SpO2 98 %. Body mass index is 44.87 kg/m.  General: Cooperative, alert, well developed, in no acute distress. HEENT: Conjunctivae and lids unremarkable. Cardiovascular: Regular rhythm.  Lungs: Normal work of breathing. Neurologic: No focal deficits.   Lab Results  Component Value Date   CREATININE 0.85 07/30/2020   BUN 12 07/30/2020   NA 135 07/30/2020   K 4.0 07/30/2020   CL 97 07/30/2020   CO2 25 07/30/2020   Lab Results  Component Value Date   ALT 19 07/30/2020   AST 18 07/30/2020   ALKPHOS 99 07/30/2020   BILITOT 0.5 07/30/2020   Lab Results  Component Value Date   HGBA1C 5.2 07/30/2020   HGBA1C 5.1 09/13/2018   HGBA1C  5.2 01/08/2018   HGBA1C 5.1 07/14/2017   Lab Results  Component Value Date   INSULIN 3.3 07/30/2020   INSULIN 5.3 09/13/2018   INSULIN 5.6 01/08/2018   INSULIN 6.0 07/14/2017   Lab Results  Component Value Date   TSH 2.330 07/30/2020   Lab Results  Component Value Date   CHOL 177 07/30/2020   HDL 57 07/30/2020   LDLCALC 105 (H) 07/30/2020   TRIG 82 07/30/2020   Lab Results  Component Value Date   WBC 7.5 07/30/2020   HGB 14.9  07/30/2020   HCT 44.1 07/30/2020   MCV 96 07/30/2020   PLT 245 07/30/2020   Lab Results  Component Value Date   IRON 76 07/30/2020   TIBC 294 07/30/2020   FERRITIN 91 07/30/2020   Attestation Statements:   Reviewed by clinician on day of visit: allergies, medications, problem list, medical history, surgical history, family history, social history, and previous encounter notes.  I, Insurance claims handler, CMA, am acting as Energy manager for [employer], DO.  I have reviewed the above documentation for accuracy and completeness, and I agree with the above. Thomasene Lot, DO

## 2020-09-18 IMAGING — RF DG UGI W/O KUB
4 series · 14 of 24 positions shown · non-contrast
Comparison: 08/02/2018

CLINICAL DATA: Status post lap band procedure. Reflux and vomiting.

EXAM:
UPPER GI SERIES WITH KUB
TECHNIQUE: After obtaining a scout radiograph a routine upper GI series was
performed using thin barium
FLUOROSCOPY TIME:  Fluoroscopy Time:  4 minutes and 36 seconds.
Radiation Exposure Index (if provided by the fluoroscopic device):
477 mGy
Number of Acquired Spot Images:

[Series 1: one shot · 0.14mm/px · 1 of 1 slices shown (1 of 2)]
[im 1/1]
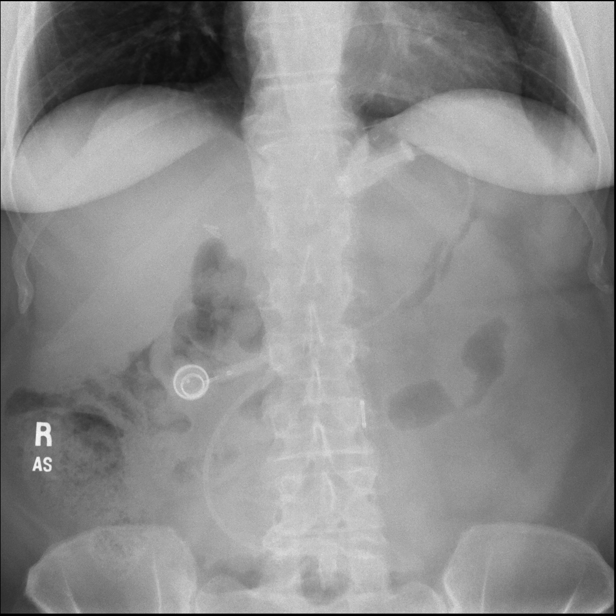

[Series 2: sequence · 2 of 101 frames shown (1 of 2)]
[frame 31/101]
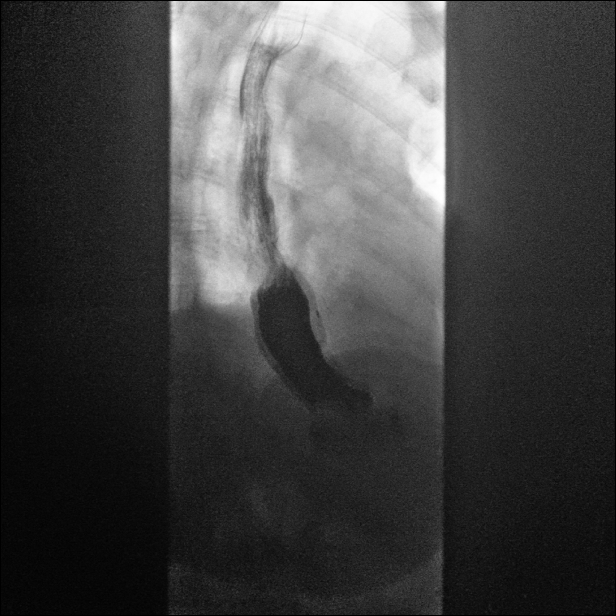
[frame 86/101]
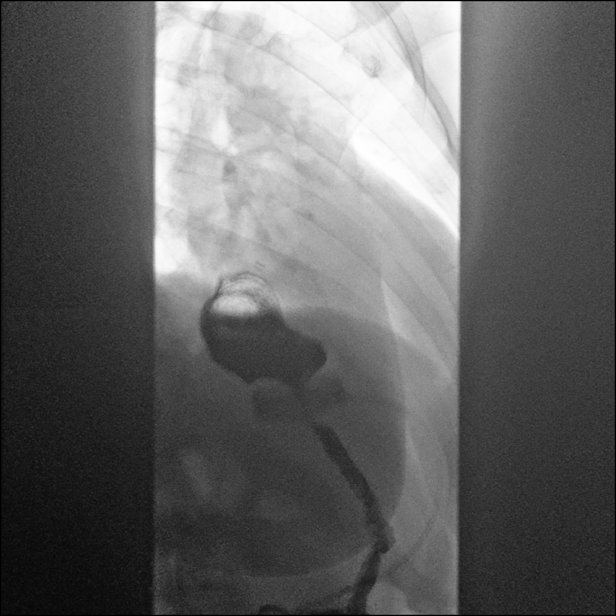

[Series 3: sequence · 2 of 135 frames shown (2 of 2)]
[frame 37/135]
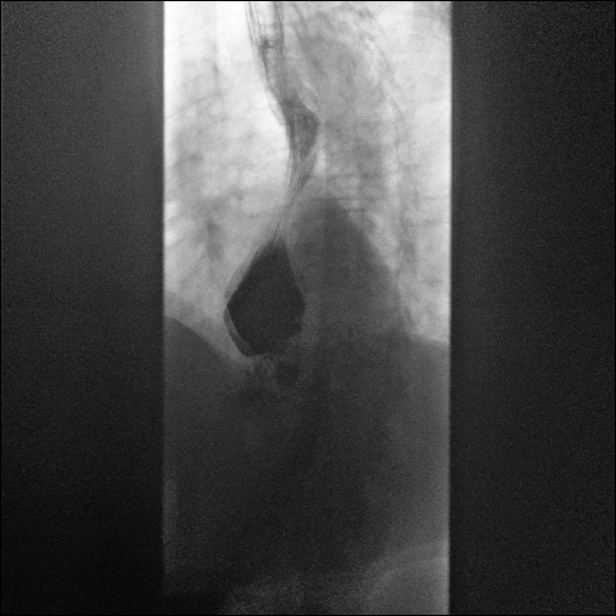
[frame 68/135]
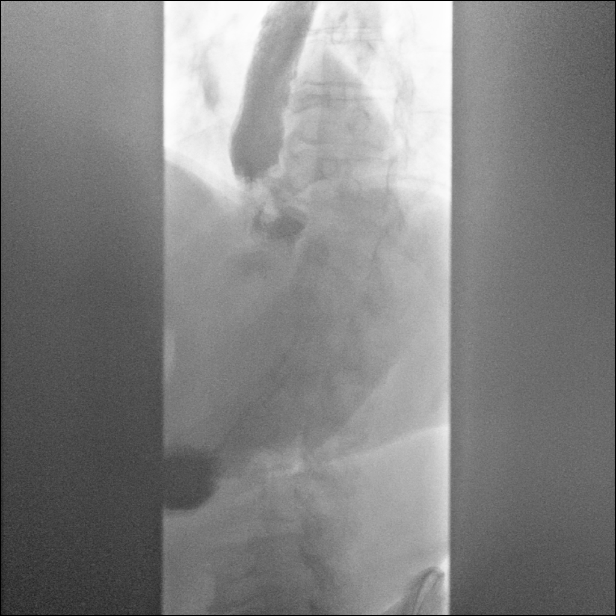

[Series 4: one shot · 0.15mm/px · 9 of 16 slices shown (2 of 2)]
[im 1/16]
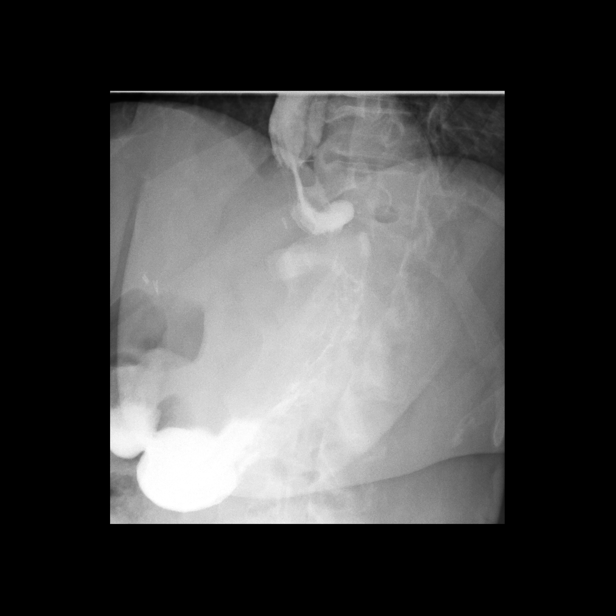
[im 3/16]
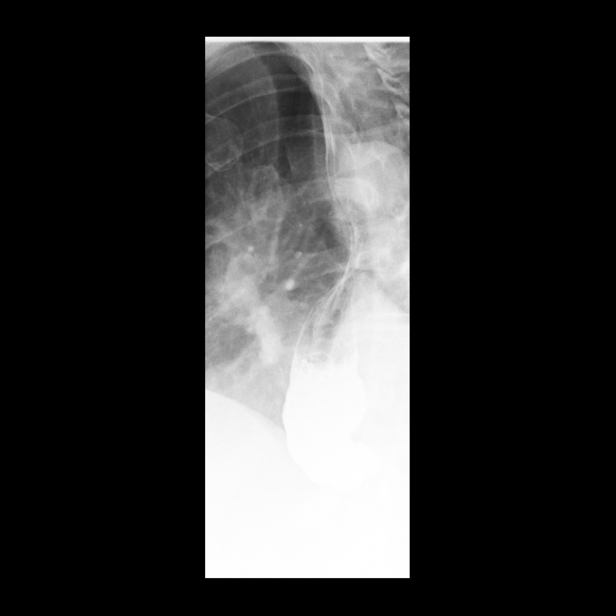
[im 5/16]
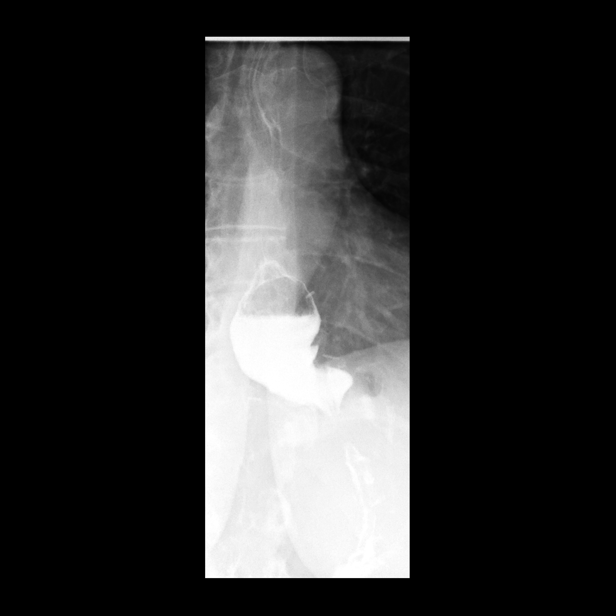
[im 7/16]
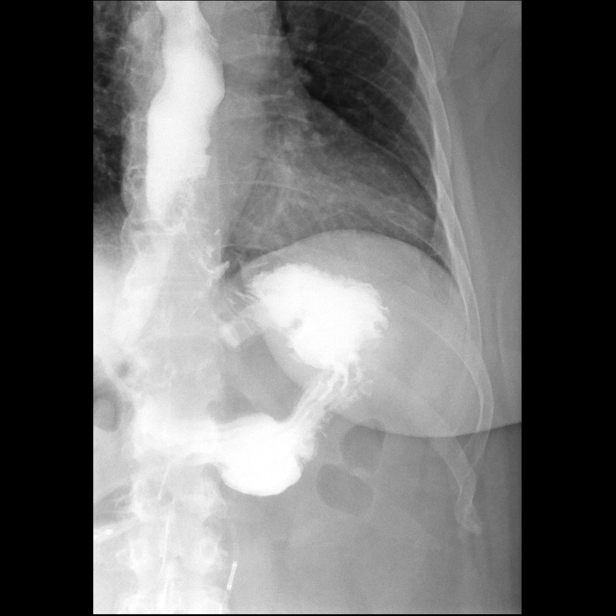
[im 9/16]
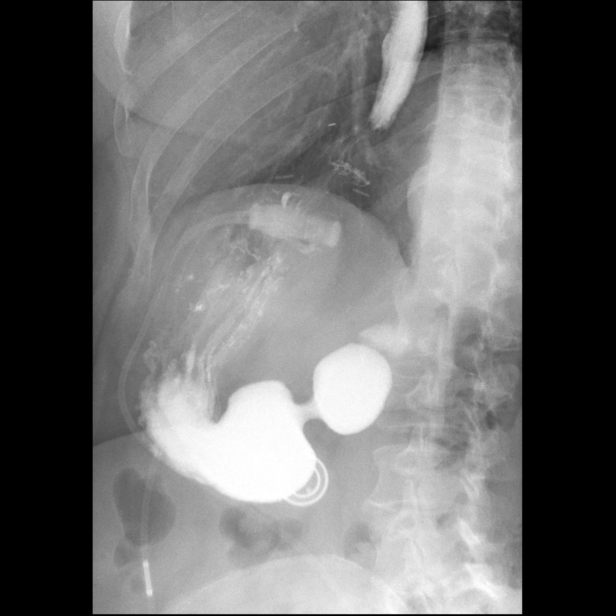
[im 11/16]
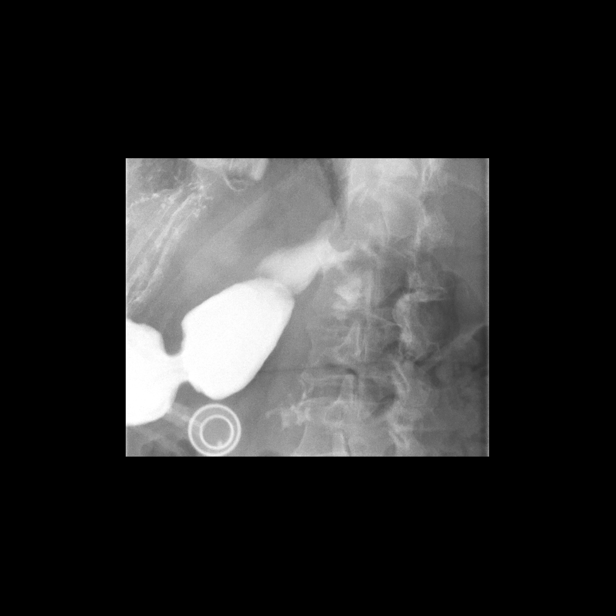
[im 12/16]
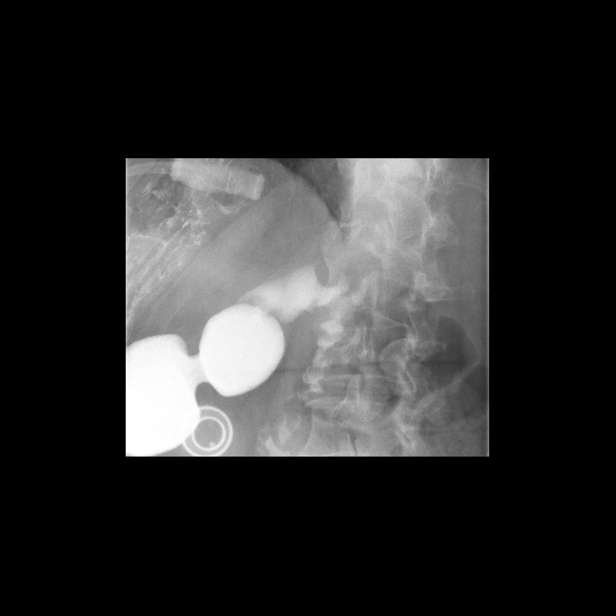
[im 14/16]
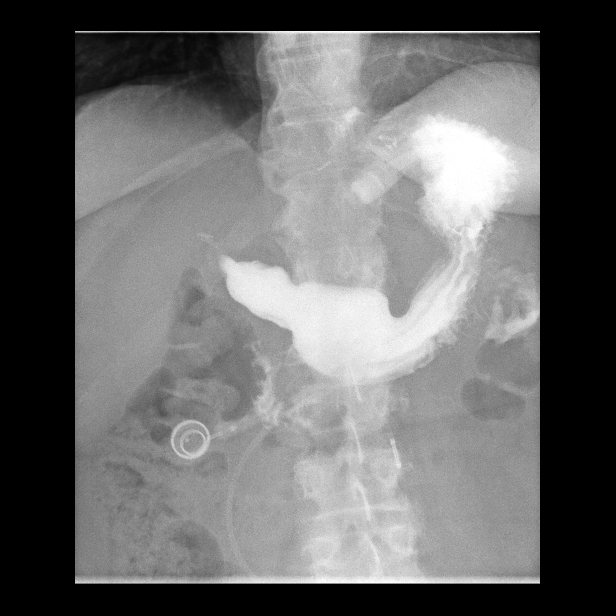
[im 16/16]
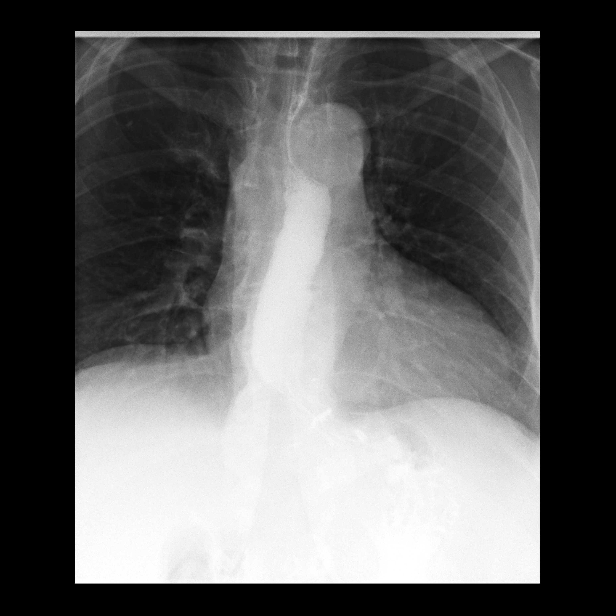

[14 of 24 positions shown; findings below may reference images not displayed]

FINDINGS: Pre-procedure KUB shows stable lap band position oriented from
approximately a [DATE] to 8:30 o'clock position.

Patient was given swallows of thin barium in an upright position.
This demonstrates string like flow of contrast through the lap band
initially. With additional swallows, there is substantial stasis of
contrast material in the esophagus, proximal to the lap band.

Eventually, and a barium passed through the band to allow assessment
of the stomach. The stomach shows no mass-effect other than that of
the lap band. No gross mucosal ulceration is evident. Gastric
emptying is relatively prompt through a normal pylorus. Duodenal
bulb is normal. The duodenal C-loop and ligament of Treitz are
normally position.

After nearly 5 minutes of observation with the patient in both
upright and horizontal positions, prominent residual contrast
material remained in the esophagus.
IMPRESSION: 1. Stable position of the lap band oriented from the [DATE] to 8:30
o'clock position.
2. There is string like flow of contrast through the lap band with
relatively long intervening periods of no flow through the band..
3. Stasis of contrast material in the esophagus, proximal to the lap
band.

## 2020-09-23 ENCOUNTER — Encounter (INDEPENDENT_AMBULATORY_CARE_PROVIDER_SITE_OTHER): Payer: Self-pay | Admitting: Family Medicine

## 2020-09-23 ENCOUNTER — Ambulatory Visit (INDEPENDENT_AMBULATORY_CARE_PROVIDER_SITE_OTHER): Payer: 59 | Admitting: Family Medicine

## 2020-09-23 ENCOUNTER — Other Ambulatory Visit: Payer: Self-pay

## 2020-09-23 VITALS — BP 130/86 | HR 63 | Temp 98.1°F | Ht 66.0 in | Wt 269.6 lb

## 2020-09-23 DIAGNOSIS — E559 Vitamin D deficiency, unspecified: Secondary | ICD-10-CM | POA: Diagnosis not present

## 2020-09-23 DIAGNOSIS — Z9189 Other specified personal risk factors, not elsewhere classified: Secondary | ICD-10-CM

## 2020-09-23 DIAGNOSIS — Z6841 Body Mass Index (BMI) 40.0 and over, adult: Secondary | ICD-10-CM | POA: Diagnosis not present

## 2020-09-23 MED ORDER — VITAMIN D (ERGOCALCIFEROL) 1.25 MG (50000 UNIT) PO CAPS: ORAL_CAPSULE | ORAL | 0 refills | Status: DC

## 2020-09-24 NOTE — Progress Notes (Signed)
Chief Complaint:   OBESITY Susan Dorsey is here to discuss her progress with her obesity treatment plan along with follow-up of her obesity related diagnoses. Sonny is on the Category 2 Plan and states she is following her eating plan approximately 83% of the time. Susan Dorsey states she is going to the gym for 60 minutes 3 times per week.  Today's visit was #: 5 Starting weight: 273 lbs Starting date: 07/30/2020 Today's weight: 269 lbs Today's date: 09/23/2020 Total lbs lost to date: 4 lbs Total lbs lost since last in-office visit: 9 lbs  Interim History:   Loany says the last 2 weeks have gone well.    She is engaging in self-care activities and feeling better emotionally.    She has increased her protein intake and decreased fruit intake.    Cravings and hunger are controlled without issues.  Subjective:   1. Vitamin D deficiency Susan Dorsey's Vitamin D level was 45.8 on 07/30/2020. She is currently taking prescription vitamin D 50,000 IU each week. She denies nausea, vomiting or muscle weakness.  2. At risk for malnutrition Danasia is at increased risk for malnutrition due to being status post gastric bypass.  Assessment/Plan:   1. Vitamin D deficiency Low Vitamin D level contributes to fatigue and are associated with obesity, breast, and colon cancer. She agrees to continue to take prescription Vitamin D @50 ,000 IU every week and will follow-up for routine testing of Vitamin D, at least 2-3 times per year to avoid over-replacement.  -Refill Vitamin D, Ergocalciferol, (DRISDOL) 1.25 MG (50000 UNIT) CAPS capsule; Take one tablet twice monthly  Dispense: 2 capsule; Refill: 0   2. At risk for malnutrition Susan Dorsey was given approximately 15 minutes of counseling today regarding prevention of malnutrition and ways to meet macronutrient goals. She is at high risk for this due to her h/o gastric bypass   3. Class 3 severe obesity with serious comorbidity and body mass index (BMI) of 40.0 to 44.9 in adult,  unspecified obesity type (HCC) Susan Dorsey is currently in the action stage of change. As such, her goal is to continue with weight loss efforts. She has agreed to the Category 2 Plan.   Exercise goals: 25 minutes on bike and 30 minutes aerobics class at the gym.  Behavioral modification strategies: increasing lean protein intake, no skipping meals, meal planning and cooking strategies and planning for success.  Susan Dorsey has agreed to follow-up with our clinic in 2 weeks. She was informed of the importance of frequent follow-up visits to maximize her success with intensive lifestyle modifications for her multiple health conditions.   Objective:   Blood pressure 130/86, pulse 63, temperature 98.1 F (36.7 C), height 5\' 6"  (1.676 m), weight 269 lb 9.6 oz (122.3 kg), SpO2 97 %. Body mass index is 43.51 kg/m.  General: Cooperative, alert, well developed, in no acute distress. HEENT: Conjunctivae and lids unremarkable. Cardiovascular: Regular rhythm.  Lungs: Normal work of breathing. Neurologic: No focal deficits.   Lab Results  Component Value Date   CREATININE 0.85 07/30/2020   BUN 12 07/30/2020   NA 135 07/30/2020   K 4.0 07/30/2020   CL 97 07/30/2020   CO2 25 07/30/2020   Lab Results  Component Value Date   ALT 19 07/30/2020   AST 18 07/30/2020   ALKPHOS 99 07/30/2020   BILITOT 0.5 07/30/2020   Lab Results  Component Value Date   HGBA1C 5.2 07/30/2020   HGBA1C 5.1 09/13/2018   HGBA1C 5.2 01/08/2018  HGBA1C 5.1 07/14/2017   Lab Results  Component Value Date   INSULIN 3.3 07/30/2020   INSULIN 5.3 09/13/2018   INSULIN 5.6 01/08/2018   INSULIN 6.0 07/14/2017   Lab Results  Component Value Date   TSH 2.330 07/30/2020   Lab Results  Component Value Date   CHOL 177 07/30/2020   HDL 57 07/30/2020   LDLCALC 105 (H) 07/30/2020   TRIG 82 07/30/2020   Lab Results  Component Value Date   WBC 7.5 07/30/2020   HGB 14.9 07/30/2020   HCT 44.1 07/30/2020   MCV 96 07/30/2020    PLT 245 07/30/2020   Lab Results  Component Value Date   IRON 76 07/30/2020   TIBC 294 07/30/2020   FERRITIN 91 07/30/2020   Attestation Statements:   Reviewed by clinician on day of visit: allergies, medications, problem list, medical history, surgical history, family history, social history, and previous encounter notes.  I, Insurance claims handler, CMA, am acting as Energy manager for [employer], DO.  I have reviewed the above documentation for accuracy and completeness, and I agree with the above. Thomasene Lot, DO

## 2020-10-01 ENCOUNTER — Encounter (HOSPITAL_COMMUNITY): Payer: Self-pay

## 2020-10-07 ENCOUNTER — Other Ambulatory Visit: Payer: Self-pay

## 2020-10-07 ENCOUNTER — Encounter (INDEPENDENT_AMBULATORY_CARE_PROVIDER_SITE_OTHER): Payer: Self-pay | Admitting: Family Medicine

## 2020-10-07 ENCOUNTER — Ambulatory Visit (INDEPENDENT_AMBULATORY_CARE_PROVIDER_SITE_OTHER): Payer: 59 | Admitting: Family Medicine

## 2020-10-07 VITALS — BP 131/85 | HR 71 | Temp 98.5°F | Ht 66.0 in | Wt 267.0 lb

## 2020-10-07 DIAGNOSIS — Z903 Acquired absence of stomach [part of]: Secondary | ICD-10-CM | POA: Diagnosis not present

## 2020-10-07 DIAGNOSIS — E559 Vitamin D deficiency, unspecified: Secondary | ICD-10-CM

## 2020-10-08 NOTE — Progress Notes (Addendum)
Chief Complaint:   OBESITY Susan Dorsey is here to discuss her progress with her obesity treatment plan along with follow-up of her obesity related diagnoses. Susan Dorsey is on the Category 2 Plan and states she is following her eating plan approximately 75% of the time. Susan Dorsey states she is going to the gym for 60 minutes 3 times per week.  Today's visit was #: 6 Starting weight: 273 lbs Starting date: 07/30/2020 Today's weight: 267 lbs Today's date: 10/07/2020 Total lbs lost to date: 6 lbs Total lbs lost since last in-office visit: 2 lbs  Interim History: Susan Dorsey says that over the past 2 weeks, she worked 12 days out of 13.  She has done a lot of eating out.  She says she went for career counseling.  Assessment/Plan:   1. S/P gastric sleeve procedure Susan Dorsey is at risk for malnutrition due to her previous bariatric surgery.  She has an appointment with Dr. Daphine Deutscher next week.  Plan:  I told her to take her meal plant to her appointment with Dr. Daphine Deutscher and let us know if he feels any changes should be made and to contact me with any questions.  Counseling  You may need to eat 3 meals and 2 snacks, or 5 small meals each day in order to reach your protein and calorie goals.   Allow at least 15 minutes for each meal so that you can eat mindfully. Listen to your body so that you do not overeat. For most people, your sleeve or pouch will comfortably hold 4-6 ounces.  Eat foods from all food groups. This includes fruits and vegetables, grains, dairy, and meat and other proteins.  Include a protein-rich food at every meal and snack, and eat the protein food first.   You should be taking a Bariatric Multivitamin as well as calcium.   2. Vitamin D deficiency  Susan Dorsey has a history of Vitamin D deficiency with resultant generalized fatigue as her primary symptom.  she is taking vitamin D 50,000 IU twice monthly for this deficiency and tolerating it well without side-effect.   Most recent Vitamin D lab  reviewed-  level: 45.8.  Plan:   - Discussed importance of vitamin D (as well as calcium) to their health and well-being.   - We reviewed possible symptoms of low Vitamin D including low energy, depressed mood, muscle aches, joint aches, osteoporosis etc.  - We discussed that low Vitamin D levels may be linked to an increased risk of cardiovascular events and even increased risk of cancers- such as colon and breast.   - Educated pt that weight loss will likely improve availability of vitamin D, thus encouraged Susan Dorsey to continue with meal plan and their weight loss efforts to further improve this condition  - I recommend pt take a weekly prescription vit D- see script below- which pt agrees to after discussion of risks and benefits of this medication.      - Informed patient this may be a lifelong thing, and she was encouraged to continue to take the medicine until pt told otherwise.   We will need to monitor levels regularly ( q 3-4 mo on average )  to keep levels within normal limits.   - All pt's questions and concerns regarding this condition addressed  3. Class 3 severe obesity with serious comorbidity and body mass index (BMI) of 40.0 to 44.9 in adult, unspecified obesity type (HCC)  Susan Dorsey is currently in the action stage of change. As  such, her goal is to continue with weight loss efforts. She has agreed to the Category 2 Plan.   Exercise goals: For substantial health benefits, adults should do at least 150 minutes (2 hours and 30 minutes) a week of moderate-intensity, or 75 minutes (1 hour and 15 minutes) a week of vigorous-intensity aerobic physical activity, or an equivalent combination of moderate- and vigorous-intensity aerobic activity. Aerobic activity should be performed in episodes of at least 10 minutes, and preferably, it should be spread throughout the week.  Behavioral modification strategies: increasing lean protein intake, decreasing simple carbohydrates, decreasing eating  out, meal planning and cooking strategies, emotional eating strategies and planning for success.  Susan Dorsey has agreed to follow-up with our clinic in 2-3 weeks. She was informed of the importance of frequent follow-up visits to maximize her success with intensive lifestyle modifications for her multiple health conditions.   Objective:   Blood pressure 131/85, pulse 71, temperature 98.5 F (36.9 C), height 5\' 6"  (1.676 m), weight 267 lb (121.1 kg), SpO2 98 %. Body mass index is 43.09 kg/m.  General: Cooperative, alert, well developed, in no acute distress. HEENT: Conjunctivae and lids unremarkable. Cardiovascular: Regular rhythm.  Lungs: Normal work of breathing. Neurologic: No focal deficits.   Lab Results  Component Value Date   CREATININE 0.85 07/30/2020   BUN 12 07/30/2020   NA 135 07/30/2020   K 4.0 07/30/2020   CL 97 07/30/2020   CO2 25 07/30/2020   Lab Results  Component Value Date   ALT 19 07/30/2020   AST 18 07/30/2020   ALKPHOS 99 07/30/2020   BILITOT 0.5 07/30/2020   Lab Results  Component Value Date   HGBA1C 5.2 07/30/2020   HGBA1C 5.1 09/13/2018   HGBA1C 5.2 01/08/2018   HGBA1C 5.1 07/14/2017   Lab Results  Component Value Date   INSULIN 3.3 07/30/2020   INSULIN 5.3 09/13/2018   INSULIN 5.6 01/08/2018   INSULIN 6.0 07/14/2017   Lab Results  Component Value Date   TSH 2.330 07/30/2020   Lab Results  Component Value Date   CHOL 177 07/30/2020   HDL 57 07/30/2020   LDLCALC 105 (H) 07/30/2020   TRIG 82 07/30/2020   Lab Results  Component Value Date   WBC 7.5 07/30/2020   HGB 14.9 07/30/2020   HCT 44.1 07/30/2020   MCV 96 07/30/2020   PLT 245 07/30/2020   Lab Results  Component Value Date   IRON 76 07/30/2020   TIBC 294 07/30/2020   FERRITIN 91 07/30/2020   Attestation Statements:   Reviewed by clinician on day of visit: allergies, medications, problem list, medical history, surgical history, family history, social history, and previous  encounter notes.  Time spent on visit including pre-visit chart review and post-visit care and charting was 23 minutes.   I, 09/29/2020, CMA, am acting as Insurance claims handler for Energy manager, DO.  I have reviewed the above documentation for accuracy and completeness, and I agree with the above. [employer], D.O.  The 21st Century Cures Act was signed into law in 2016 which includes the topic of electronic health records.  This provides immediate access to information in MyChart.  This includes consultation notes, operative notes, office notes, lab results and pathology reports.  If you have any questions about what you read please let 2017 know at your next visit so we can discuss your concerns and take corrective action if need be.  We are right here with you.

## 2020-11-02 ENCOUNTER — Ambulatory Visit (INDEPENDENT_AMBULATORY_CARE_PROVIDER_SITE_OTHER): Payer: 59 | Admitting: Family Medicine

## 2020-11-04 ENCOUNTER — Encounter (INDEPENDENT_AMBULATORY_CARE_PROVIDER_SITE_OTHER): Payer: Self-pay | Admitting: Family Medicine

## 2020-11-04 ENCOUNTER — Other Ambulatory Visit: Payer: Self-pay

## 2020-11-04 ENCOUNTER — Ambulatory Visit (INDEPENDENT_AMBULATORY_CARE_PROVIDER_SITE_OTHER): Payer: 59 | Admitting: Family Medicine

## 2020-11-04 VITALS — BP 125/85 | HR 63 | Temp 97.9°F | Ht 66.0 in | Wt 268.0 lb

## 2020-11-04 DIAGNOSIS — Z9189 Other specified personal risk factors, not elsewhere classified: Secondary | ICD-10-CM | POA: Diagnosis not present

## 2020-11-04 DIAGNOSIS — Z903 Acquired absence of stomach [part of]: Secondary | ICD-10-CM

## 2020-11-04 DIAGNOSIS — Z6841 Body Mass Index (BMI) 40.0 and over, adult: Secondary | ICD-10-CM | POA: Diagnosis not present

## 2020-11-04 DIAGNOSIS — E559 Vitamin D deficiency, unspecified: Secondary | ICD-10-CM

## 2020-11-04 MED ORDER — VITAMIN D (ERGOCALCIFEROL) 1.25 MG (50000 UNIT) PO CAPS: ORAL_CAPSULE | ORAL | 0 refills | Status: DC

## 2020-11-05 NOTE — Progress Notes (Signed)
Chief Complaint:   OBESITY Susan Dorsey is here to discuss her progress with her obesity treatment plan along with follow-up of her obesity related diagnoses. Susan Dorsey is on the Category 2 Plan and states she is following her eating plan approximately 80% of the time. Susan Dorsey states she is going to the gym for 60 minutes 3 times per week.  Today's visit was #: 7 Starting weight: 273 lbs Starting date: 07/30/2020 Today's weight: 268 lbs Today's date: 11/04/2020 Total lbs lost to date: 5 lbs Total lbs lost since last in-office visit: +1 lb Total weight loss percentage to date: -1.83%  Interim History: Susan Dorsey says she went on vacation in [place] and [place] and was away for 7-10 days.  She planned ahead and took foods to the hotel, etc.  She is happy with only a 1 pound weight gain.    Assessment/Plan:   Meds ordered this encounter  Medications  . Vitamin D, Ergocalciferol, (DRISDOL) 1.25 MG (50000 UNIT) CAPS capsule    Sig: Take one tablet twice monthly    Dispense:  2 capsule    Refill:  0   No orders of the defined types were placed in this encounter.    1. S/P gastric sleeve procedure Susan Dorsey met with Dr. Hassell Done last week.  He wanted her to eat more fat and protein and less bread/carbs.  No bread at breakfast or lunch.  Eating cauliflower crackers/bread; started eating 1/4 cup unsalted nuts and even occasionally some avocados.  Plan:  We will change her meal plan today to journaling to accommodate more fats in her diet.  She will track on MFP and we will review her progress at her next office visit.   2. Vitamin D deficiency Susan Dorsey's Vitamin D level was 45.8 on 07/30/2020. She is currently taking prescription vitamin D 50,000 IU each week. She denies nausea, vomiting or muscle weakness.  Low Vitamin D level contributes to fatigue and are associated with obesity, breast, and colon cancer. She agrees to continue to take prescription Vitamin D @50 ,000 IU every week and will follow-up for routine testing  of Vitamin D, at least 2-3 times per year to avoid over-replacement.  -Refill Vitamin D, Ergocalciferol, (DRISDOL) 1.25 MG (50000 UNIT) CAPS capsule; Take one tablet twice monthly  Dispense: 2 capsule; Refill: 0   3. At risk for deficient intake of food Susan Dorsey was given approximately >8 minutes of deficit intake of food prevention counseling today. Cailynn is at risk for eating too few calories based on current food recall. She was encouraged to focus on meeting caloric and protein goals according to her recommended meal plan.   4. Class 3 severe obesity with serious comorbidity and body mass index (BMI) of 40.0 to 44.9 in adult, unspecified obesity type (Susan Dorsey)  Susan Dorsey is currently in the action stage of change. As such, her goal is to continue with weight loss efforts. She has agreed to CHANGE PLAN TO : keeping a food journal and adhering to recommended goals of 1200-1400 calories and 100-120 grams of protein.   Exercise goals: As is.  Behavioral modification strategies: increasing lean protein intake, decreasing simple carbohydrates, meal planning and cooking strategies and planning for success.  Susan Dorsey has agreed to follow-up with our clinic in 2-3 weeks. She was informed of the importance of frequent follow-up visits to maximize her success with intensive lifestyle modifications for her multiple health conditions.   Objective:   Blood pressure 125/85, pulse 63, temperature 97.9 F (36.6 C), height 5'  6" (1.676 m), weight 268 lb (121.6 kg), SpO2 98 %. Body mass index is 43.26 kg/m.  General: Cooperative, alert, well developed, in no acute distress. HEENT: Conjunctivae and lids unremarkable. Cardiovascular: Regular rhythm.  Lungs: Normal work of breathing. Neurologic: No focal deficits.   Lab Results  Component Value Date   CREATININE 0.85 07/30/2020   BUN 12 07/30/2020   NA 135 07/30/2020   K 4.0 07/30/2020   CL 97 07/30/2020   CO2 25 07/30/2020   Lab Results  Component Value Date    ALT 19 07/30/2020   AST 18 07/30/2020   ALKPHOS 99 07/30/2020   BILITOT 0.5 07/30/2020   Lab Results  Component Value Date   HGBA1C 5.2 07/30/2020   HGBA1C 5.1 09/13/2018   HGBA1C 5.2 01/08/2018   HGBA1C 5.1 07/14/2017   Lab Results  Component Value Date   INSULIN 3.3 07/30/2020   INSULIN 5.3 09/13/2018   INSULIN 5.6 01/08/2018   INSULIN 6.0 07/14/2017   Lab Results  Component Value Date   TSH 2.330 07/30/2020   Lab Results  Component Value Date   CHOL 177 07/30/2020   HDL 57 07/30/2020   LDLCALC 105 (H) 07/30/2020   TRIG 82 07/30/2020   Lab Results  Component Value Date   WBC 7.5 07/30/2020   HGB 14.9 07/30/2020   HCT 44.1 07/30/2020   MCV 96 07/30/2020   PLT 245 07/30/2020   Lab Results  Component Value Date   IRON 76 07/30/2020   TIBC 294 07/30/2020   FERRITIN 91 07/30/2020   Attestation Statements:   Reviewed by clinician on day of visit: allergies, medications, problem list, medical history, surgical history, family history, social history, and previous encounter notes.  I, Water quality scientist, CMA, am acting as Location manager for [employer], DO.  I have reviewed the above documentation for accuracy and completeness, and I agree with the above. Marjory Sneddon, D.O.  The [place] was signed into law in 2016 which includes the topic of electronic health records.  This provides immediate access to information in MyChart.  This includes consultation notes, operative notes, office notes, lab results and pathology reports.  If you have any questions about what you read please let us know at your next visit so we can discuss your concerns and take corrective action if need be.  We are right here with you.

## 2020-11-18 ENCOUNTER — Other Ambulatory Visit: Payer: Self-pay

## 2020-11-18 ENCOUNTER — Encounter (INDEPENDENT_AMBULATORY_CARE_PROVIDER_SITE_OTHER): Payer: Self-pay | Admitting: Family Medicine

## 2020-11-18 ENCOUNTER — Ambulatory Visit (INDEPENDENT_AMBULATORY_CARE_PROVIDER_SITE_OTHER): Payer: 59 | Admitting: Family Medicine

## 2020-11-18 VITALS — BP 121/86 | HR 56 | Temp 97.5°F | Ht 66.0 in | Wt 269.0 lb

## 2020-11-18 DIAGNOSIS — Z903 Acquired absence of stomach [part of]: Secondary | ICD-10-CM

## 2020-11-18 DIAGNOSIS — I10 Essential (primary) hypertension: Secondary | ICD-10-CM | POA: Insufficient documentation

## 2020-11-18 DIAGNOSIS — Z6841 Body Mass Index (BMI) 40.0 and over, adult: Secondary | ICD-10-CM

## 2020-11-23 NOTE — Progress Notes (Signed)
Chief Complaint:   OBESITY Susan Dorsey is here to discuss her progress with her obesity treatment plan along with follow-up of her obesity related diagnoses. Susan Dorsey is on keeping a food journal and adhering to recommended goals of 1200-1400 calories and 100-120 grams of protein and states she is following her eating plan approximately 75% of the time. Susan Dorsey states she is going to the gym for 60 minutes 3 times per week.  Today's visit was #: 8 Starting weight: 273 lbs Starting date: 07/30/2020 Today's weight: 269 lbs Today's date: 11/18/2020 Total lbs lost to date: 4 lbs Total lbs lost since last in-office visit: +1 lb Total weight loss percentage to date: -1.47%  Interim History: Susan Dorsey is not journaling.  She says that it is a challenge and a lot of work.  She will start a new job on December 16 and she is very happy about that.  She loves her new boss.  She will be traveling for the holidays.  Labs were last drawn on 07/30/2020, but she has only had a 4 pound difference in weight from then until now.  Will hold off on repeat labs until 5-10% weight loss has been achieved.    Assessment/Plan:   1. S/P gastric sleeve procedure In 2020.  Unable to eat all lunch or all dinner at one time.  Plan:  Long discussing regarding the need ot follow the plan closely and not make her own substitutions (for now) and break up proteins/amounts into multiple "small" meals.  2. Essential hypertension diet controlled Asymptomatic, no concerns or new headaches, vision changes, etc.  Not checking blood pressure at home.  Plan:  Blood pressure is slightly above goal of 130/80 or less.  Decrease salt intake, continue prudent nutritional plan and follow meal plan so weight loss can be achieved.   BP Readings from Last 3 Encounters:  11/18/20 121/86  11/04/20 125/85  10/07/20 131/85   3. Class 3 severe obesity with serious comorbidity and body mass index (BMI) of 40.0 to 44.9 in adult, unspecified obesity type  (HCC)  Susan Dorsey is currently in the action stage of change. As such, her goal is to continue with weight loss efforts. She has agreed to the Category 2 Plan with breakfast options and 2 ounces of protein substitutions.  Sheet given.   Exercise goals: For substantial health benefits, adults should do at least 150 minutes (2 hours and 30 minutes) a week of moderate-intensity, or 75 minutes (1 hour and 15 minutes) a week of vigorous-intensity aerobic physical activity, or an equivalent combination of moderate- and vigorous-intensity aerobic activity. Aerobic activity should be performed in episodes of at least 10 minutes, and preferably, it should be spread throughout the week consistently.  Behavioral modification strategies: increasing lean protein intake, decreasing simple carbohydrates, meal planning and cooking strategies, keeping healthy foods in the home, better snacking choices, travel eating strategies, holiday eating strategies  and planning for success.  Susan Dorsey has agreed to follow-up with our clinic in 2 weeks after change in plan today. She was informed of the importance of frequent follow-up visits to maximize her success with intensive lifestyle modifications for her multiple health conditions.   Objective:   Blood pressure 121/86, pulse (!) 56, temperature (!) 97.5 F (36.4 C), height 5\' 6"  (1.676 m), weight 269 lb (122 kg), SpO2 99 %. Body mass index is 43.42 kg/m.  General: Cooperative, alert, well developed, in no acute distress. HEENT: Conjunctivae and lids unremarkable. Cardiovascular: Regular rhythm.  Lungs:  Normal work of breathing. Neurologic: No focal deficits.   Lab Results  Component Value Date   CREATININE 0.85 07/30/2020   BUN 12 07/30/2020   NA 135 07/30/2020   K 4.0 07/30/2020   CL 97 07/30/2020   CO2 25 07/30/2020   Lab Results  Component Value Date   ALT 19 07/30/2020   AST 18 07/30/2020   ALKPHOS 99 07/30/2020   BILITOT 0.5 07/30/2020   Lab Results   Component Value Date   HGBA1C 5.2 07/30/2020   HGBA1C 5.1 09/13/2018   HGBA1C 5.2 01/08/2018   HGBA1C 5.1 07/14/2017   Lab Results  Component Value Date   INSULIN 3.3 07/30/2020   INSULIN 5.3 09/13/2018   INSULIN 5.6 01/08/2018   INSULIN 6.0 07/14/2017   Lab Results  Component Value Date   TSH 2.330 07/30/2020   Lab Results  Component Value Date   CHOL 177 07/30/2020   HDL 57 07/30/2020   LDLCALC 105 (H) 07/30/2020   TRIG 82 07/30/2020   Lab Results  Component Value Date   WBC 7.5 07/30/2020   HGB 14.9 07/30/2020   HCT 44.1 07/30/2020   MCV 96 07/30/2020   PLT 245 07/30/2020   Lab Results  Component Value Date   IRON 76 07/30/2020   TIBC 294 07/30/2020   FERRITIN 91 07/30/2020   Attestation Statements:   Reviewed by clinician on day of visit: allergies, medications, problem list, medical history, surgical history, family history, social history, and previous encounter notes.  Time spent on visit including pre-visit chart review and post-visit care and charting was 30 minutes.   I, Insurance claims handler, CMA, am acting as Energy manager for [employer], DO.  I have reviewed the above documentation for accuracy and completeness, and I agree with the above. Carlye Grippe, D.O.  The 21st Century Cures Act was signed into law in 2016 which includes the topic of electronic health records.  This provides immediate access to information in MyChart.  This includes consultation notes, operative notes, office notes, lab results and pathology reports.  If you have any questions about what you read please let us know at your next visit so we can discuss your concerns and take corrective action if need be.  We are right here with you.

## 2020-12-02 ENCOUNTER — Ambulatory Visit (INDEPENDENT_AMBULATORY_CARE_PROVIDER_SITE_OTHER): Payer: 59 | Admitting: Family Medicine

## 2020-12-02 ENCOUNTER — Other Ambulatory Visit: Payer: Self-pay

## 2020-12-02 ENCOUNTER — Encounter (INDEPENDENT_AMBULATORY_CARE_PROVIDER_SITE_OTHER): Payer: Self-pay | Admitting: Family Medicine

## 2020-12-02 VITALS — BP 130/81 | HR 59 | Temp 97.9°F | Ht 66.0 in | Wt 265.0 lb

## 2020-12-02 DIAGNOSIS — M1712 Unilateral primary osteoarthritis, left knee: Secondary | ICD-10-CM | POA: Diagnosis not present

## 2020-12-02 DIAGNOSIS — Z9189 Other specified personal risk factors, not elsewhere classified: Secondary | ICD-10-CM

## 2020-12-02 DIAGNOSIS — E559 Vitamin D deficiency, unspecified: Secondary | ICD-10-CM | POA: Diagnosis not present

## 2020-12-02 DIAGNOSIS — E66813 Obesity, class 3: Secondary | ICD-10-CM

## 2020-12-02 DIAGNOSIS — Z6841 Body Mass Index (BMI) 40.0 and over, adult: Secondary | ICD-10-CM | POA: Diagnosis not present

## 2020-12-02 MED ORDER — VITAMIN D (ERGOCALCIFEROL) 1.25 MG (50000 UNIT) PO CAPS: ORAL_CAPSULE | ORAL | 0 refills | Status: DC

## 2020-12-02 NOTE — Progress Notes (Signed)
Chief Complaint:   OBESITY Susan Dorsey is here to discuss her progress with her obesity treatment plan along with follow-up of her obesity related diagnoses. Susan Dorsey is on the Category 2 Plan with breakfast options with 2 oz protein subs and states she is following her eating plan approximately 90% of the time. Susan Dorsey states she is doing gym exercises 60 minutes 3 times per week.  Today's visit was #: 9 Starting weight: 273 lbs Starting date: 07/30/2020 Today's weight: 265 lbs Today's date: 12/08/2020 Total lbs lost to date: 8 lbs Total lbs lost since last in-office visit: 4 lbs Total weight loss percentage to date: -2.93%  Interim History: Susan Dorsey is doing 1300-1400 calories with 100+ protein per day as she is following the Category 2 plan with breakfast options, but she likes to track on some days as well. She "enjoyed" Thanksgiving and ate off the plan while traveling. She is happy she lost weight and denies issues with plan.  Assessment/Plan:   1. Arthropathy of left knee with pain Susan Dorsey went to her orthopedic doctor and they will be doing home physical therapy. They are awaiting insurance approval of gel shots. She sees Dr. Turner Daniels at [facility].   Plan: Continue physical therapy, ice as needed, and follow up with ortho for definitive treatment.  2. Vitamin D deficiency Susan Dorsey Vitamin D level was 45.8 on 07/30/2020. She is currently taking prescription vitamin D 50,000 IU each week. She denies nausea, vomiting or muscle weakness.  Plan: Refill Vit D for 1 month, as per below. Low Vitamin D level contributes to fatigue and are associated with obesity, breast, and colon cancer. She agrees to continue to take prescription Vitamin D @50 ,000 IU every week and will follow-up for routine testing of Vitamin D, at least 2-3 times per year to avoid over-replacement.  Refill- Vitamin D, Ergocalciferol, (DRISDOL) 1.25 MG (50000 UNIT) CAPS capsule; Take one tablet twice monthly  Dispense: 2 capsule; Refill:  0  3. At risk for activity intolerance Susan Dorsey was given approximately 15 minutes of exercise intolerance counseling today. She is 59 y.o. female and has risk factors exercise intolerance including obesity and knee arthritis pain. We discussed intensive lifestyle modifications today with an emphasis on specific weight loss instructions and strategies. Susan Dorsey will slowly increase activity as tolerated.  4. Class 3 severe obesity with serious comorbidity and body mass index (BMI) of 40.0 to 44.9 in adult, unspecified obesity type (HCC) Susan Dorsey is currently in the action stage of change. As such, her goal is to continue with weight loss efforts. She has agreed to the Category 2 Plan with breakfast options plus protein equivalents guide for 2 oz.   Exercise goals: As is  Behavioral modification strategies: increasing lean protein intake, meal planning and cooking strategies and planning for success.  Susan Dorsey has agreed to follow-up with our clinic in 2 weeks. She was informed of the importance of frequent follow-up visits to maximize her success with intensive lifestyle modifications for her multiple health conditions.   Objective:   Blood pressure 130/81, pulse (!) 59, temperature 97.9 F (36.6 C), height 5\' 6"  (1.676 m), weight 265 lb (120.2 kg), SpO2 98 %. Body mass index is 42.77 kg/m.  General: Cooperative, alert, well developed, in no acute distress. HEENT: Conjunctivae and lids unremarkable. Cardiovascular: Regular rhythm.  Lungs: Normal work of breathing. Neurologic: No focal deficits.   Lab Results  Component Value Date   CREATININE 0.85 07/30/2020   BUN 12 07/30/2020   NA 135  07/30/2020   K 4.0 07/30/2020   CL 97 07/30/2020   CO2 25 07/30/2020   Lab Results  Component Value Date   ALT 19 07/30/2020   AST 18 07/30/2020   ALKPHOS 99 07/30/2020   BILITOT 0.5 07/30/2020   Lab Results  Component Value Date   HGBA1C 5.2 07/30/2020   HGBA1C 5.1 09/13/2018   HGBA1C 5.2 01/08/2018    HGBA1C 5.1 07/14/2017   Lab Results  Component Value Date   INSULIN 3.3 07/30/2020   INSULIN 5.3 09/13/2018   INSULIN 5.6 01/08/2018   INSULIN 6.0 07/14/2017   Lab Results  Component Value Date   TSH 2.330 07/30/2020   Lab Results  Component Value Date   CHOL 177 07/30/2020   HDL 57 07/30/2020   LDLCALC 105 (H) 07/30/2020   TRIG 82 07/30/2020   Lab Results  Component Value Date   WBC 7.5 07/30/2020   HGB 14.9 07/30/2020   HCT 44.1 07/30/2020   MCV 96 07/30/2020   PLT 245 07/30/2020   Lab Results  Component Value Date   IRON 76 07/30/2020   TIBC 294 07/30/2020   FERRITIN 91 07/30/2020    Attestation Statements:   Reviewed by clinician on day of visit: allergies, medications, problem list, medical history, surgical history, family history, social history, and previous encounter notes.  Edmund Hilda, am acting as Energy manager for [employer], DO.  I have reviewed the above documentation for accuracy and completeness, and I agree with the above. Carlye Grippe, D.O.  The 21st Century Cures Act was signed into law in 2016 which includes the topic of electronic health records.  This provides immediate access to information in MyChart.  This includes consultation notes, operative notes, office notes, lab results and pathology reports.  If you have any questions about what you read please let us know at your next visit so we can discuss your concerns and take corrective action if need be.  We are right here with you.

## 2020-12-15 ENCOUNTER — Ambulatory Visit (INDEPENDENT_AMBULATORY_CARE_PROVIDER_SITE_OTHER): Payer: 59 | Admitting: Family Medicine

## 2020-12-15 ENCOUNTER — Other Ambulatory Visit: Payer: Self-pay

## 2020-12-15 ENCOUNTER — Encounter (INDEPENDENT_AMBULATORY_CARE_PROVIDER_SITE_OTHER): Payer: Self-pay | Admitting: Family Medicine

## 2020-12-15 VITALS — BP 143/80 | HR 58 | Temp 97.9°F | Ht 66.0 in | Wt 266.0 lb

## 2020-12-15 DIAGNOSIS — E559 Vitamin D deficiency, unspecified: Secondary | ICD-10-CM | POA: Diagnosis not present

## 2020-12-15 DIAGNOSIS — Z6841 Body Mass Index (BMI) 40.0 and over, adult: Secondary | ICD-10-CM

## 2020-12-15 DIAGNOSIS — E7849 Other hyperlipidemia: Secondary | ICD-10-CM

## 2020-12-15 DIAGNOSIS — R7989 Other specified abnormal findings of blood chemistry: Secondary | ICD-10-CM

## 2020-12-15 DIAGNOSIS — Z9189 Other specified personal risk factors, not elsewhere classified: Secondary | ICD-10-CM | POA: Diagnosis not present

## 2020-12-15 MED ORDER — VITAMIN D (ERGOCALCIFEROL) 1.25 MG (50000 UNIT) PO CAPS: ORAL_CAPSULE | ORAL | 0 refills | Status: DC

## 2020-12-15 NOTE — Patient Instructions (Signed)
The 10-year ASCVD risk score Denman George DC Montez Hageman., et al., 2013) is: 2.7%   Values used to calculate the score:     Age: 59 years     Sex: Female     Is Non-Hispanic African American: No     Diabetic: No     Tobacco smoker: No     Systolic Blood Pressure: 143 mmHg     Is BP treated: No     HDL Cholesterol: 57 mg/dL     Total Cholesterol: 185 mg/dL

## 2020-12-16 NOTE — Progress Notes (Signed)
Chief Complaint:   OBESITY Susan Dorsey is here to discuss her progress with her obesity treatment plan along with follow-up of her obesity related diagnoses. Susan Dorsey is on the Category 2 Plan with breakfast options and protein equivalents for 2 oz and states she is following her eating plan approximately 80% of the time. Susan Dorsey states she is doing boot camp and stationary bike 60 minutes 3 times per week.  Today's visit was #: 10 Starting weight: 273 lbs Starting date: 07/30/2020 Today's weight: 266 lbs Today's date: 12/15/2020 Total lbs lost to date: 7 lbs Total lbs lost since last in-office visit: +1 Total weight loss percentage to date: -2.56%  Interim History: Susan Dorsey reports that she started a new job yesterday and she loves it and her new boss. She recently had labs done at her PCP's office at [facility] and asked me to review them with her in office. Patient's Vit D resulted as 64 ( you have result as 63 on the back sheet, so I not sure if it's 63 or 64) on 12/08/2020.   Assessment/Plan:   1. Vitamin D deficiency Discussed recent labs done at [facility] with patient today.  Vit D result shows 63 on 12/08/2020. Susan Dorsey's Vitamin D level was 45.8 on 07/30/2020. She is currently taking prescription vitamin D 50,000 IU each week. She denies nausea, vomiting or muscle weakness.   Ref. Range 07/30/2020 12:01  Vitamin D, 25-Hydroxy Latest Ref Range: 30.0 - 100.0 ng/mL 45.8   Plan: Refill Vit D for 1 month, as per below. Low Vitamin D level contributes to fatigue and are associated with obesity, breast, and colon cancer. She agrees to continue to take prescription Vitamin D @50 ,000 IU every week and will follow-up for routine testing of Vitamin D, at least 2-3 times per year to avoid over-replacement.  Refill- Vitamin D, Ergocalciferol, (DRISDOL) 1.25 MG (50000 UNIT) CAPS capsule; Take one tablet twice monthly  Dispense: 2 capsule; Refill: 0  2. Other hyperlipidemia Discussed labs with patient today. Susan Dorsey is not  prescribed medication, as her diagnosis is diet controlled. It is mostly improved from prior. Results are as follows: LDL= 109, HDL= 61, TG= 79. Recent labs done in our clinic listed below. Lab Results  Component Value Date   ALT 19 07/30/2020   AST 18 07/30/2020   ALKPHOS 99 07/30/2020   BILITOT 0.5 07/30/2020   Lab Results  Component Value Date   CHOL 177 07/30/2020   HDL 57 07/30/2020   LDLCALC 105 (H) 07/30/2020   TRIG 82 07/30/2020   The 10-year ASCVD risk score 09/29/2020 DC Jr., et al., 2013) is: 2.7%   Values used to calculate the score:     Age: 89 years     Sex: Female     Is Non-Hispanic African American: No     Diabetic: No     Tobacco smoker: No     Systolic Blood Pressure: 143 mmHg     Is BP treated: No     HDL Cholesterol: 57 mg/dL     Total Cholesterol: 185 mg/dL  Plan: Athziri is advised to continue prudent nutritional plan, weight loss, and increase exercise. She is also advised to decrease saturated and trans fats. Education provided.  3. Elevated TSH New. Discussed labs with patient today. Recent labs showed Susan Dorsey's TSH result of 4.56. She was told by her PCP that they will watch her levels and recheck in the near future. Susan Dorsey denies symptoms or concerns and no additional thyroid labs were  drawn.  Plan: Consider adding Free T4 and T3 when repeat TSH is done in 2-4 months. Mykala is asymptomatic and we'll continue to monitor alongside her PCP.  4. At risk for malnutrition Susan Dorsey was given approximately 12 minutes of counseling today regarding prevention of malnutrition and ways to meet macronutrient goals, as she is status-post band and gastric sleeve..   5. Class 3 severe obesity with serious comorbidity and body mass index (BMI) of 40.0 to 44.9 in adult, unspecified obesity type (HCC) Susan Dorsey is currently in the action stage of change. As such, her goal is to continue with weight loss efforts. She has agreed to the Category 2 Plan with breakfast options and protein equivalents.    Exercise goals: As is  Behavioral modification strategies: holiday eating strategies  and planning for success.  Susan Dorsey has agreed to follow-up with our clinic in 2 weeks. She was informed of the importance of frequent follow-up visits to maximize her success with intensive lifestyle modifications for her multiple health conditions.   Objective:   Blood pressure (!) 143/80, pulse (!) 58, temperature 97.9 F (36.6 C), height 5\' 6"  (1.676 m), weight 266 lb (120.7 kg), SpO2 98 %. Body mass index is 42.93 kg/m.  General: Cooperative, alert, well developed, in no acute distress. HEENT: Conjunctivae and lids unremarkable. Cardiovascular: Regular rhythm.  Lungs: Normal work of breathing. Neurologic: No focal deficits.   Lab Results  Component Value Date   CREATININE 0.85 07/30/2020   BUN 12 07/30/2020   NA 135 07/30/2020   K 4.0 07/30/2020   CL 97 07/30/2020   CO2 25 07/30/2020   Lab Results  Component Value Date   ALT 19 07/30/2020   AST 18 07/30/2020   ALKPHOS 99 07/30/2020   BILITOT 0.5 07/30/2020   Lab Results  Component Value Date   HGBA1C 5.2 07/30/2020   HGBA1C 5.1 09/13/2018   HGBA1C 5.2 01/08/2018   HGBA1C 5.1 07/14/2017   Lab Results  Component Value Date   INSULIN 3.3 07/30/2020   INSULIN 5.3 09/13/2018   INSULIN 5.6 01/08/2018   INSULIN 6.0 07/14/2017   Lab Results  Component Value Date   TSH 2.330 07/30/2020   Lab Results  Component Value Date   CHOL 177 07/30/2020   HDL 57 07/30/2020   LDLCALC 105 (H) 07/30/2020   TRIG 82 07/30/2020   Lab Results  Component Value Date   WBC 7.5 07/30/2020   HGB 14.9 07/30/2020   HCT 44.1 07/30/2020   MCV 96 07/30/2020   PLT 245 07/30/2020   Lab Results  Component Value Date   IRON 76 07/30/2020   TIBC 294 07/30/2020   FERRITIN 91 07/30/2020   Attestation Statements:   Reviewed by clinician on day of visit: allergies, medications, problem list, medical history, surgical history, family history, social  history, and previous encounter notes.  09/29/2020, am acting as Edmund Hilda for Energy manager, DO.  I have reviewed the above documentation for accuracy and completeness, and I agree with the above. [employer], D.O.  The 21st Century Cures Act was signed into law in 2016 which includes the topic of electronic health records.  This provides immediate access to information in MyChart.  This includes consultation notes, operative notes, office notes, lab results and pathology reports.  If you have any questions about what you read please let 2017 know at your next visit so we can discuss your concerns and take corrective action if need be.  We are  right here with you.

## 2020-12-29 ENCOUNTER — Ambulatory Visit (INDEPENDENT_AMBULATORY_CARE_PROVIDER_SITE_OTHER): Payer: 59 | Admitting: Family Medicine

## 2020-12-29 ENCOUNTER — Other Ambulatory Visit: Payer: Self-pay

## 2020-12-29 ENCOUNTER — Encounter (INDEPENDENT_AMBULATORY_CARE_PROVIDER_SITE_OTHER): Payer: Self-pay | Admitting: Family Medicine

## 2020-12-29 VITALS — BP 163/84 | Temp 97.9°F | Ht 66.0 in | Wt 267.0 lb

## 2020-12-29 DIAGNOSIS — E559 Vitamin D deficiency, unspecified: Secondary | ICD-10-CM | POA: Diagnosis not present

## 2020-12-29 DIAGNOSIS — R030 Elevated blood-pressure reading, without diagnosis of hypertension: Secondary | ICD-10-CM | POA: Insufficient documentation

## 2020-12-29 DIAGNOSIS — Z6841 Body Mass Index (BMI) 40.0 and over, adult: Secondary | ICD-10-CM

## 2020-12-29 DIAGNOSIS — Z9189 Other specified personal risk factors, not elsewhere classified: Secondary | ICD-10-CM | POA: Diagnosis not present

## 2020-12-29 MED ORDER — VITAMIN D (ERGOCALCIFEROL) 1.25 MG (50000 UNIT) PO CAPS: ORAL_CAPSULE | ORAL | 0 refills | Status: DC

## 2020-12-31 NOTE — Progress Notes (Signed)
Chief Complaint:   OBESITY Susan Dorsey is here to discuss her progress with her obesity treatment plan along with follow-up of her obesity related diagnoses. Susan Dorsey is on the Category 2 Plan with breakfast options and protein equivalents and states she is following her eating plan approximately 80% of the time. Susan Dorsey states she is exercising 0 minutes 0 times per week.  Today's visit was #: 11 Starting weight: 273 lbs Starting date: 07/30/2020 Today's weight: 267 lbs Today's date: 12/29/2020 Total lbs lost to date: 6 lbs Total lbs lost since last in-office visit: +1 lb Total weight loss percentage to date: -2.20%  Interim History: Susan Dorsey reports that every other year she would gain 10 lbs over Thanksgiving and Christmas. She is down 2 lbs since November 2021. She plans on going back to the gym tomorrow and starting with a personal trainer, as well.  Assessment/Plan:   1. Elevated blood pressure reading Susan Dorsey has known white coat syndrome and has not been on medication for hypertension since symptoms about 13 years ago. She checks her blood pressure at home 1-2 times a month and iti usually runs around 125/78.   BP Readings from Last 3 Encounters:  12/29/20 (!) 163/84  12/15/20 (!) 143/80  12/02/20 130/81    Plan: Practice home blood pressure monitoring 3-4 days a week for the next 2 weeks and bring in log to next OV. We will adjust treatment plan if needed at that time.  Continue to focus on low sodium food items, exercise and lose wt   2. Vitamin D deficiency Susan Dorsey's Vitamin D level was 45.8 on 07/30/2020. She is currently taking prescription vitamin D 50,000 IU each week. She denies nausea, vomiting or muscle weakness.   Ref. Range 07/30/2020 12:01  Vitamin D, 25-Hydroxy Latest Ref Range: 30.0 - 100.0 ng/mL 45.8   Plan: Refill Vit D for 1 month, as per below. Low Vitamin D level contributes to fatigue and are associated with obesity, breast, and colon cancer. She agrees to continue to take  prescription Vitamin D @50 ,000 IU twice a month and will follow-up for routine testing of Vitamin D, at least 2-3 times per year to avoid over-replacement.  Refill- Vitamin D, Ergocalciferol, (DRISDOL) 1.25 MG (50000 UNIT) CAPS capsule; Take one tablet twice monthly  Dispense: 2 capsule; Refill: 0   3. At risk for impaired metabolic function Susan Dorsey was given approximately 9 minutes of impaired  metabolic function prevention counseling today. We discussed intensive lifestyle modifications today with an emphasis on specific nutrition and exercise instructions and strategies.    4. Class 3 severe obesity with serious comorbidity and body mass index (BMI) of 40.0 to 44.9 in adult, unspecified obesity type (HCC) Susan Dorsey is currently in the action stage of change. As such, her goal is to continue with weight loss efforts. She has agreed to the Category 2 Plan with breakfast options and protein equivalents.   Exercise goals: Susan Dorsey will be getting a personal trainer in the near futrue  Behavioral modification strategies: meal planning and cooking strategies, keeping healthy foods in the home and planning for success.  Susan Dorsey has agreed to follow-up with our clinic in 2-3 weeks. She was informed of the importance of frequent follow-up visits to maximize her success with intensive lifestyle modifications for her multiple health conditions.   Objective:   Blood pressure (!) 163/84, temperature 97.9 F (36.6 C), height 5\' 6"  (1.676 m), weight 267 lb (121.1 kg), SpO2 99 %. Body mass index is 43.09 kg/m.  General: Cooperative, alert, well developed, in no acute distress. HEENT: Conjunctivae and lids unremarkable. Cardiovascular: Regular rhythm.  Lungs: Normal work of breathing. Neurologic: No focal deficits.   Lab Results  Component Value Date   CREATININE 0.85 07/30/2020   BUN 12 07/30/2020   NA 135 07/30/2020   K 4.0 07/30/2020   CL 97 07/30/2020   CO2 25 07/30/2020   Lab Results  Component Value  Date   ALT 19 07/30/2020   AST 18 07/30/2020   ALKPHOS 99 07/30/2020   BILITOT 0.5 07/30/2020   Lab Results  Component Value Date   HGBA1C 5.2 07/30/2020   HGBA1C 5.1 09/13/2018   HGBA1C 5.2 01/08/2018   HGBA1C 5.1 07/14/2017   Lab Results  Component Value Date   INSULIN 3.3 07/30/2020   INSULIN 5.3 09/13/2018   INSULIN 5.6 01/08/2018   INSULIN 6.0 07/14/2017   Lab Results  Component Value Date   TSH 2.330 07/30/2020   Lab Results  Component Value Date   CHOL 177 07/30/2020   HDL 57 07/30/2020   LDLCALC 105 (H) 07/30/2020   TRIG 82 07/30/2020   Lab Results  Component Value Date   WBC 7.5 07/30/2020   HGB 14.9 07/30/2020   HCT 44.1 07/30/2020   MCV 96 07/30/2020   PLT 245 07/30/2020   Lab Results  Component Value Date   IRON 76 07/30/2020   TIBC 294 07/30/2020   FERRITIN 91 07/30/2020    Attestation Statements:   Reviewed by clinician on day of visit: allergies, medications, problem list, medical history, surgical history, family history, social history, and previous encounter notes.   Edmund Hilda, am acting as Energy manager for [employer], DO.  I have reviewed the above documentation for accuracy and completeness, and I agree with the above. Carlye Grippe, D.O.  The 21st Century Cures Act was signed into law in 2016 which includes the topic of electronic health records.  This provides immediate access to information in MyChart.  This includes consultation notes, operative notes, office notes, lab results and pathology reports.  If you have any questions about what you read please let us know at your next visit so we can discuss your concerns and take corrective action if need be.  We are right here with you.

## 2021-01-12 ENCOUNTER — Telehealth (INDEPENDENT_AMBULATORY_CARE_PROVIDER_SITE_OTHER): Payer: 59 | Admitting: Family Medicine

## 2021-01-12 ENCOUNTER — Encounter (INDEPENDENT_AMBULATORY_CARE_PROVIDER_SITE_OTHER): Payer: Self-pay | Admitting: Family Medicine

## 2021-01-12 VITALS — BP 125/72 | Wt 268.0 lb

## 2021-01-12 DIAGNOSIS — Z9189 Other specified personal risk factors, not elsewhere classified: Secondary | ICD-10-CM

## 2021-01-12 DIAGNOSIS — Z6841 Body Mass Index (BMI) 40.0 and over, adult: Secondary | ICD-10-CM | POA: Diagnosis not present

## 2021-01-12 DIAGNOSIS — F32A Depression, unspecified: Secondary | ICD-10-CM | POA: Insufficient documentation

## 2021-01-12 DIAGNOSIS — F3289 Other specified depressive episodes: Secondary | ICD-10-CM | POA: Diagnosis not present

## 2021-01-12 DIAGNOSIS — R030 Elevated blood-pressure reading, without diagnosis of hypertension: Secondary | ICD-10-CM | POA: Diagnosis not present

## 2021-01-13 NOTE — Progress Notes (Signed)
TeleHealth Visit:  Due to the COVID-19 pandemic, this visit was completed with telemedicine (audio/video) technology to reduce patient and provider exposure as well as to preserve personal protective equipment.   Susan Dorsey has verbally consented to this TeleHealth visit. The patient is located at home, the provider is located at the [facility] office. The participants in this visit include the listed provider and patient. The visit was conducted today via video.  Chief Complaint: OBESITY Susan Dorsey is here to discuss her progress with her obesity treatment plan along with follow-up of her obesity related diagnoses. Susan Dorsey is on the Category 2 Plan with breakfast options and protein equivalents and states she is following her eating plan approximately 65% of the time. Susan Dorsey states she is intentional housework 30 minutes 3 times per week.  Today's visit was #: 12 Starting weight: 273 lbs Starting date: 07/30/2020  Interim History: "I have been the "snack queen" lately. [employer] thinks she gained 3 lbs since her last OV. She did not start with personal trainer yet and hasn't been to the gym due to COVID and snow. Eating on plan otherwise.  Plan: Discussed with patient healthy alternatives for snacks when she gets cravings.  Assessment/Plan:   1. White coat syndrome without diagnosis of hypertension Susan Dorsey's BP in last OV was 163/84. Her BP readings at home on most days run 118-125/60-70's. She has no issues.  BP Readings from Last 3 Encounters:  01/12/21 125/72  12/29/20 (!) 163/84  12/15/20 (!) 143/80   Plan: Continue home blood pressure monitoring 2 times a week and prn. Continue prudent nutritional plan and weight loss.  2. Other depression, with emotional eating Susan Dorsey reports she is doing a lot of boredom eating/mindless eating lately. She has a new job and although a lot better situation, she just doesn't feel productive enough and spends more time snacking when bored.  Plan: We discussed  possible meds, such as Wellbutrin, that can help but patient wishes to wait for now. Advised to do mindful eating meditation via Calm app or even YouTube at least twice daily and increase activity/exercise. Also, obtain hobbies or things to take up her excess time.  3. At risk for depression Susan Dorsey was given approximately 9 minutes of depression risk counseling today. She has risk factors for depression including job change and boredom. Although patient denies being depressed at all now, she is at higher risk due to circumstances. We discussed the importance of a healthy work life balance, a healthy relationship with food and a good support system.  4. Class 3 severe obesity with serious comorbidity and body mass index (BMI) of 40.0 to 44.9 in adult, unspecified obesity type (HCC) Susan Dorsey is currently in the action stage of change. As such, her goal is to continue with weight loss efforts. She has agreed to the Category 2 Plan with breakfast options and protein equivalents.   Exercise goals: For substantial health benefits, adults should do at least 150 minutes (2 hours and 30 minutes) a week of moderate-intensity, or 75 minutes (1 hour and 15 minutes) a week of vigorous-intensity aerobic physical activity, or an equivalent combination of moderate- and vigorous-intensity aerobic activity. Aerobic activity should be performed in episodes of at least 10 minutes, and preferably, it should be spread throughout the week. Restart exercise routine even if from home via [employer].  Behavioral modification strategies: meal planning and cooking strategies, keeping healthy foods in the home, ways to avoid boredom eating, better snacking choices, emotional eating  strategies and planning for success.  Susan Dorsey has agreed to follow-up with our clinic in 2 weeks on 01/26/2021 at 0820. She was informed of the importance of frequent follow-up visits to maximize her success with intensive lifestyle modifications for her multiple  health conditions.  Objective:   VITALS: Per patient if applicable, see vitals. GENERAL: Alert and in no acute distress. CARDIOPULMONARY: No increased WOB. Speaking in clear sentences.  PSYCH: Pleasant and cooperative. Speech normal rate and rhythm. Affect is appropriate. Insight and judgement are appropriate. Attention is focused, linear, and appropriate.  NEURO: Oriented as arrived to appointment on time with no prompting.   Lab Results  Component Value Date   CREATININE 0.85 07/30/2020   BUN 12 07/30/2020   NA 135 07/30/2020   K 4.0 07/30/2020   CL 97 07/30/2020   CO2 25 07/30/2020   Lab Results  Component Value Date   ALT 19 07/30/2020   AST 18 07/30/2020   ALKPHOS 99 07/30/2020   BILITOT 0.5 07/30/2020   Lab Results  Component Value Date   HGBA1C 5.2 07/30/2020   HGBA1C 5.1 09/13/2018   HGBA1C 5.2 01/08/2018   HGBA1C 5.1 07/14/2017   Lab Results  Component Value Date   INSULIN 3.3 07/30/2020   INSULIN 5.3 09/13/2018   INSULIN 5.6 01/08/2018   INSULIN 6.0 07/14/2017   Lab Results  Component Value Date   TSH 2.330 07/30/2020   Lab Results  Component Value Date   CHOL 177 07/30/2020   HDL 57 07/30/2020   LDLCALC 105 (H) 07/30/2020   TRIG 82 07/30/2020   Lab Results  Component Value Date   WBC 7.5 07/30/2020   HGB 14.9 07/30/2020   HCT 44.1 07/30/2020   MCV 96 07/30/2020   PLT 245 07/30/2020   Lab Results  Component Value Date   IRON 76 07/30/2020   TIBC 294 07/30/2020   FERRITIN 91 07/30/2020    Attestation Statements:   Reviewed by clinician on day of visit: allergies, medications, problem list, medical history, surgical history, family history, social history, and previous encounter notes.  Edmund Hilda, am acting as Energy manager for [employer], DO.  I have reviewed the above documentation for accuracy and completeness, and I agree with the above. Carlye Grippe, D.O.  The 21st Century Cures Act was signed into law in  2016 which includes the topic of electronic health records.  This provides immediate access to information in MyChart.  This includes consultation notes, operative notes, office notes, lab results and pathology reports.  If you have any questions about what you read please let us know at your next visit so we can discuss your concerns and take corrective action if need be.  We are right here with you.

## 2021-01-26 ENCOUNTER — Other Ambulatory Visit: Payer: Self-pay

## 2021-01-26 ENCOUNTER — Encounter (INDEPENDENT_AMBULATORY_CARE_PROVIDER_SITE_OTHER): Payer: Self-pay | Admitting: Family Medicine

## 2021-01-26 ENCOUNTER — Ambulatory Visit (INDEPENDENT_AMBULATORY_CARE_PROVIDER_SITE_OTHER): Payer: 59 | Admitting: Family Medicine

## 2021-01-26 VITALS — BP 138/85 | HR 59 | Temp 97.4°F | Ht 66.0 in | Wt 266.0 lb

## 2021-01-26 DIAGNOSIS — Z6841 Body Mass Index (BMI) 40.0 and over, adult: Secondary | ICD-10-CM | POA: Diagnosis not present

## 2021-01-26 DIAGNOSIS — Z9189 Other specified personal risk factors, not elsewhere classified: Secondary | ICD-10-CM | POA: Diagnosis not present

## 2021-01-26 DIAGNOSIS — E559 Vitamin D deficiency, unspecified: Secondary | ICD-10-CM | POA: Diagnosis not present

## 2021-01-26 DIAGNOSIS — R030 Elevated blood-pressure reading, without diagnosis of hypertension: Secondary | ICD-10-CM

## 2021-01-26 MED ORDER — VITAMIN D (ERGOCALCIFEROL) 1.25 MG (50000 UNIT) PO CAPS: ORAL_CAPSULE | ORAL | 0 refills | Status: DC

## 2021-01-27 NOTE — Progress Notes (Signed)
Chief Complaint:   OBESITY Susan Dorsey is here to discuss her progress with her obesity treatment plan along with follow-up of her obesity related diagnoses.   Today's visit was #: 13 Starting weight: 273 lbs Starting date: 07/30/2020 Today's weight: 266 lbs Today's date: 01/26/2021 Total lbs lost to date: 7 lbs Body mass index is 42.93 kg/m.  Total weight loss percentage to date: -2.56%  Interim History:  Susan Dorsey is here for a follow up office visit.  she is following the meal plan without concern or issues.  Patient's meal and food recall appears to be accurate and consistent with what is on the plan.   When on plan, her hunger and cravings are well controlled.    Nutrition Plan: the Category 2 Plan with breakfast options and protein equivalents for 75% of the time.  Activity: Boot camp, biking, and stretching for 30-60 minutes 5 times per week.  Assessment/Plan:   No orders of the defined types were placed in this encounter.   Medications Discontinued During This Encounter  Medication Reason  . Vitamin D, Ergocalciferol, (DRISDOL) 1.25 MG (50000 UNIT) CAPS capsule Reorder     Meds ordered this encounter  Medications  . Vitamin D, Ergocalciferol, (DRISDOL) 1.25 MG (50000 UNIT) CAPS capsule    Sig: Take one tablet twice monthly    Dispense:  2 capsule    Refill:  0     1. Vitamin D deficiency Improving, but not optimized. Current vitamin D is 45.8, tested on 07/30/2020. Optimal goal > 50 ng/dL.   Plan:  [x]   Continue Vitamin D @50 ,000 IU every week. []   Continue home supplement daily. [x]   Follow-up for routine testing of Vitamin D at least 2-3 times per year to avoid over-replacement.  - Refill Vitamin D, Ergocalciferol, (DRISDOL) 1.25 MG (50000 UNIT) CAPS capsule; Take one tablet twice monthly  Dispense: 2 capsule; Refill: 0  2. White coat syndrome without diagnosis of hypertension Home blood pressure 116/65. Asx. No complaints or concerns  Plan:  Continue home  blood pressure monitoring, and low salt diet.  Continue to increase exercise.  Follow prudent nutritional plan and continue weight loss.  BP Readings from Last 3 Encounters:  01/26/21 138/85  01/12/21 125/72  12/29/20 (!) 163/84   Lab Results  Component Value Date   CREATININE 0.85 07/30/2020   3. At risk for dehydration Susan Dorsey is at higher than average risk of dehydration.  Susan Dorsey was given more than 9 minutes of proper hydration counseling today.  We discussed the signs and symptoms of dehydration some of which may include muscle cramping, constipation or even orthostatic symptoms.   Counseling on the prevention of dehydration was also provided today.  Susan Dorsey is at risk for dehydration due to weight loss, lifestyle and behavorial habits and possibly due to taking certain medication(s).  She was encouraged to adequately hydrate and monitor fluid status to avoid dehydration as well as weight loss plateaus.  Unless pre-existing renal or cardiopulmonary conditions exist which pt was told to limit their fluid intake, I recommended roughly one half of their weight in pounds to be the approximate ounces of non-caloric, non-caffeinated beverages they should drink per day; including more if they are engaging in exercise.  4. Class 3 severe obesity with serious comorbidity and body mass index (BMI) of 40.0 to 44.9 in adult, unspecified obesity type [facility])  Course: Susan Dorsey is currently in the action stage of change. As such, her goal is to continue with weight  loss efforts.   Nutrition goals: She has agreed to the Category 2 Plan with breakfast options and protein equivalents.   Exercise goals: For substantial health benefits, adults should do at least 150 minutes (2 hours and 30 minutes) a week of moderate-intensity, or 75 minutes (1 hour and 15 minutes) a week of vigorous-intensity aerobic physical activity, or an equivalent combination of moderate- and vigorous-intensity aerobic activity. Aerobic activity should be  performed in episodes of at least 10 minutes, and preferably, it should be spread throughout the week.  Behavioral modification strategies: increasing water intake, ways to avoid boredom eating, emotional eating strategies and avoiding temptations.  Susan Dorsey has agreed to follow-up with our clinic in 2-3 weeks. She was informed of the importance of frequent follow-up visits to maximize her success with intensive lifestyle modifications for her multiple health conditions.   Objective:   Blood pressure 138/85, pulse (!) 59, temperature (!) 97.4 F (36.3 C), height 5\' 6"  (1.676 m), weight 266 lb (120.7 kg), SpO2 98 %. Body mass index is 42.93 kg/m.  General: Cooperative, alert, well developed, in no acute distress. HEENT: Conjunctivae and lids unremarkable. Cardiovascular: Regular rhythm.  Lungs: Normal work of breathing. Neurologic: No focal deficits.   Lab Results  Component Value Date   CREATININE 0.85 07/30/2020   BUN 12 07/30/2020   NA 135 07/30/2020   K 4.0 07/30/2020   CL 97 07/30/2020   CO2 25 07/30/2020   Lab Results  Component Value Date   ALT 19 07/30/2020   AST 18 07/30/2020   ALKPHOS 99 07/30/2020   BILITOT 0.5 07/30/2020   Lab Results  Component Value Date   HGBA1C 5.2 07/30/2020   HGBA1C 5.1 09/13/2018   HGBA1C 5.2 01/08/2018   HGBA1C 5.1 07/14/2017   Lab Results  Component Value Date   INSULIN 3.3 07/30/2020   INSULIN 5.3 09/13/2018   INSULIN 5.6 01/08/2018   INSULIN 6.0 07/14/2017   Lab Results  Component Value Date   TSH 2.330 07/30/2020   Lab Results  Component Value Date   CHOL 177 07/30/2020   HDL 57 07/30/2020   LDLCALC 105 (H) 07/30/2020   TRIG 82 07/30/2020   Lab Results  Component Value Date   WBC 7.5 07/30/2020   HGB 14.9 07/30/2020   HCT 44.1 07/30/2020   MCV 96 07/30/2020   PLT 245 07/30/2020   Lab Results  Component Value Date   IRON 76 07/30/2020   TIBC 294 07/30/2020   FERRITIN 91 07/30/2020   Attestation Statements:    Reviewed by clinician on day of visit: allergies, medications, problem list, medical history, surgical history, family history, social history, and previous encounter notes.  I, 09/29/2020, CMA, am acting as Insurance claims handler for Energy manager, DO.  I have reviewed the above documentation for accuracy and completeness, and I agree with the above. [employer], D.O.  The 21st Century Cures Act was signed into law in 2016 which includes the topic of electronic health records.  This provides immediate access to information in MyChart.  This includes consultation notes, operative notes, office notes, lab results and pathology reports.  If you have any questions about what you read please let 2017 know at your next visit so we can discuss your concerns and take corrective action if need be.  We are right here with you.

## 2021-02-15 ENCOUNTER — Ambulatory Visit (INDEPENDENT_AMBULATORY_CARE_PROVIDER_SITE_OTHER): Payer: 59 | Admitting: Family Medicine

## 2021-02-15 ENCOUNTER — Other Ambulatory Visit: Payer: Self-pay

## 2021-02-15 ENCOUNTER — Encounter (INDEPENDENT_AMBULATORY_CARE_PROVIDER_SITE_OTHER): Payer: Self-pay | Admitting: Family Medicine

## 2021-02-15 VITALS — BP 132/84 | HR 67 | Temp 98.0°F | Ht 66.0 in | Wt 266.0 lb

## 2021-02-15 DIAGNOSIS — E559 Vitamin D deficiency, unspecified: Secondary | ICD-10-CM

## 2021-02-15 DIAGNOSIS — R632 Polyphagia: Secondary | ICD-10-CM | POA: Insufficient documentation

## 2021-02-15 DIAGNOSIS — E611 Iron deficiency: Secondary | ICD-10-CM | POA: Insufficient documentation

## 2021-02-15 DIAGNOSIS — Z9189 Other specified personal risk factors, not elsewhere classified: Secondary | ICD-10-CM

## 2021-02-15 DIAGNOSIS — R5383 Other fatigue: Secondary | ICD-10-CM

## 2021-02-15 DIAGNOSIS — Z6841 Body Mass Index (BMI) 40.0 and over, adult: Secondary | ICD-10-CM

## 2021-02-15 DIAGNOSIS — E538 Deficiency of other specified B group vitamins: Secondary | ICD-10-CM

## 2021-02-15 DIAGNOSIS — R7989 Other specified abnormal findings of blood chemistry: Secondary | ICD-10-CM | POA: Insufficient documentation

## 2021-02-15 MED ORDER — VITAMIN D (ERGOCALCIFEROL) 1.25 MG (50000 UNIT) PO CAPS: ORAL_CAPSULE | ORAL | 0 refills | Status: DC

## 2021-02-16 LAB — T3: T3, Total: 115 ng/dL (ref 71–180)

## 2021-02-16 LAB — T4, FREE: Free T4: 1.43 ng/dL (ref 0.82–1.77)

## 2021-02-16 LAB — TSH: TSH: 3.21 u[IU]/mL (ref 0.450–4.500)

## 2021-02-18 NOTE — Progress Notes (Signed)
Chief Complaint:   OBESITY Susan Dorsey is here to discuss her progress with her obesity treatment plan along with follow-up of her obesity related diagnoses.   Today's visit was #: 14 Starting weight: 273 lbs Starting date: 07/30/2020 Today's weight: 266 lbs Today's date: 02/15/2021 Total lbs lost to date: 7 lbs Body mass index is 42.93 kg/m.  Total weight loss percentage to date: -2.56%  Interim History:  [name] denies issues with the plan and is following it the majority of the time, she says.  No desire to change plan.  She has been doing more working out with her trainer lately.  Labs were done with her PCP in December 2021.  All within normal limits including CBC and thyroid panel.  Current Meal Plan: the Category 2 Plan with breakfast options and protein equivalents.  Current Exercise Plan: Biking, personal trainer, stretches for 60 minutes 5 times per week.  This patient is following the prescribed meal plan meal without concerns.  Food recall appears to be consistent with the prescribed plan.  When following the plan, hunger and cravings are well controlled.    Assessment/Plan:   1. Tiredness Tareka says she feels like she is sleeping well after decreasing her caffeine intake and increasing her water and exercise, but wants to sleep longer and it has been tough to wake up lately.  TSH was 4.56 on 12/08/2020.  Plan:  Labs recently done with PCP, including B12, CBC, thyroid all within normal limits.  Continue healthy habits and reassess thyroid today.  Bring in lab results from PCP.  Will check TSH, T3, and free T4 today since it has been over 2-2.5 months ago.  - T4, free - T3 - TSH  2. Vitamin D deficiency At goal. Current vitamin D is 64.7, tested on 12/08/2020 at PCP's office. Optimal goal > 50 ng/dL.  She is taking vitamin D 50,000 IU weekly.  Plan: Continue to take prescription Vitamin D @50 ,000 IU every week as prescribed.  Follow-up for routine testing of Vitamin D, at  least 2-3 times per year to avoid over-replacement.  - Refill Vitamin D, Ergocalciferol, (DRISDOL) 1.25 MG (50000 UNIT) CAPS capsule; Take one tablet twice monthly  Dispense: 2 capsule; Refill: 0  3. Iron deficiency Fe 45, Hct 47.0, Hgb 15.8.  She is taking elemental ferritin daily.  Plan:  Continue supplement.  Continue prudent nutritional plan.  4. Polyphagia She says she has been snacking more than usual lately, especially in mid-morning and afternoon.  Plan:  Declines medication, and she says that she does not want to be on anymore medications.  She will adjust snack times to help her desires.  5. Vitamin B 12 deficiency Lab Results  Component Value Date   VITAMINB12 1,796 (H) 07/30/2020   History of gastric sleeve.  Vitamin B12 level was 1409 on 12/08/2020.  Plan:  Continue supplement and prudent nutritional plan.   6. At risk for deficient intake of food [name] was given extensive education and counseling today of more than 9 minutes on risks associated with deficient food intake.  Counseled her on the importance of following our prescribed meal plan and eating adequate amounts of protein.  Discussed with [name] 12/10/2020 that inadequate food intake over longer periods of time can slow their metabolism down significantly.  She is risk due to increasing exercise as of late.  Will monitor closely.  7. Class 3 severe obesity with serious comorbidity and body mass index (BMI) of 40.0 to 44.9  in adult, unspecified obesity type [facility])  Course: Susan Dorsey is currently in the action stage of change. As such, her goal is to continue with weight loss efforts.   Nutrition goals: She has agreed to the Category 2 Plan.   Exercise goals: As is.  Behavioral modification strategies: decreasing simple carbohydrates, increasing water intake, keeping healthy foods in the home, ways to avoid night time snacking, better snacking choices and planning for success.  [name] has agreed to follow-up with our clinic  in 3 weeks. She was informed of the importance of frequent follow-up visits to maximize her success with intensive lifestyle modifications for her multiple health conditions.   Objective:   Blood pressure 132/84, pulse 67, temperature 98 F (36.7 C), height 5\' 6"  (1.676 m), weight 266 lb (120.7 kg), SpO2 99 %. Body mass index is 42.93 kg/m.  General: Cooperative, alert, well developed, in no acute distress. HEENT: Conjunctivae and lids unremarkable. Cardiovascular: Regular rhythm.  Lungs: Normal work of breathing. Neurologic: No focal deficits.   Lab Results  Component Value Date   CREATININE 0.85 07/30/2020   BUN 12 07/30/2020   NA 135 07/30/2020   K 4.0 07/30/2020   CL 97 07/30/2020   CO2 25 07/30/2020   Lab Results  Component Value Date   ALT 19 07/30/2020   AST 18 07/30/2020   ALKPHOS 99 07/30/2020   BILITOT 0.5 07/30/2020   Lab Results  Component Value Date   HGBA1C 5.2 07/30/2020   HGBA1C 5.1 09/13/2018   HGBA1C 5.2 01/08/2018   HGBA1C 5.1 07/14/2017   Lab Results  Component Value Date   INSULIN 3.3 07/30/2020   INSULIN 5.3 09/13/2018   INSULIN 5.6 01/08/2018   INSULIN 6.0 07/14/2017   Lab Results  Component Value Date   TSH 3.210 02/15/2021   Lab Results  Component Value Date   CHOL 177 07/30/2020   HDL 57 07/30/2020   LDLCALC 105 (H) 07/30/2020   TRIG 82 07/30/2020   Lab Results  Component Value Date   WBC 7.5 07/30/2020   HGB 14.9 07/30/2020   HCT 44.1 07/30/2020   MCV 96 07/30/2020   PLT 245 07/30/2020   Lab Results  Component Value Date   IRON 76 07/30/2020   TIBC 294 07/30/2020   FERRITIN 91 07/30/2020   Attestation Statements:   Reviewed by clinician on day of visit: allergies, medications, problem list, medical history, surgical history, family history, social history, and previous encounter notes.  I, 09/29/2020, CMA, am acting as Insurance claims handler for Energy manager, DO.  I have reviewed the above documentation for accuracy  and completeness, and I agree with the above. [employer], D.O.  The 21st Century Cures Act was signed into law in 2016 which includes the topic of electronic health records.  This provides immediate access to information in MyChart.  This includes consultation notes, operative notes, office notes, lab results and pathology reports.  If you have any questions about what you read please let 2017 know at your next visit so we can discuss your concerns and take corrective action if need be.  We are right here with you.

## 2021-03-10 ENCOUNTER — Encounter (INDEPENDENT_AMBULATORY_CARE_PROVIDER_SITE_OTHER): Payer: Self-pay | Admitting: Family Medicine

## 2021-03-10 ENCOUNTER — Other Ambulatory Visit: Payer: Self-pay

## 2021-03-10 ENCOUNTER — Ambulatory Visit (INDEPENDENT_AMBULATORY_CARE_PROVIDER_SITE_OTHER): Payer: 59 | Admitting: Family Medicine

## 2021-03-10 VITALS — BP 134/83 | HR 64 | Temp 98.0°F | Ht 66.0 in | Wt 267.0 lb

## 2021-03-10 DIAGNOSIS — K5909 Other constipation: Secondary | ICD-10-CM | POA: Diagnosis not present

## 2021-03-10 DIAGNOSIS — Z9189 Other specified personal risk factors, not elsewhere classified: Secondary | ICD-10-CM

## 2021-03-10 DIAGNOSIS — E559 Vitamin D deficiency, unspecified: Secondary | ICD-10-CM | POA: Diagnosis not present

## 2021-03-10 DIAGNOSIS — Z6841 Body Mass Index (BMI) 40.0 and over, adult: Secondary | ICD-10-CM | POA: Diagnosis not present

## 2021-03-10 MED ORDER — VITAMIN D (ERGOCALCIFEROL) 1.25 MG (50000 UNIT) PO CAPS: ORAL_CAPSULE | ORAL | 0 refills | Status: DC

## 2021-03-17 NOTE — Progress Notes (Signed)
Chief Complaint:   OBESITY Susan Dorsey is here to discuss her progress with her obesity treatment plan along with follow-up of her obesity related diagnoses.   Today's visit was #: 15 Starting weight: 273 lbs Starting date: 07/30/2020 Today's weight: 267 lbs Today's date: 03/10/2021 Total lbs lost to date: 6 lbs Body mass index is 43.09 kg/m.  Total weight loss percentage to date: -2.20%  Interim History:  Susan Dorsey says that things are going well.  She is working out for 4 hours per week and with a Systems analyst 2 days per week.  She thinks she has put on muscle mass and is proud of her accomplishments.  She has less stress at work.  Current Meal Plan: the Category 2 Plan with breakfast options for 75% of the time.  Current Exercise Plan: Biking and stretching for 60 minutes 5 times per week and working with a Systems analyst.  Assessment/Plan:   1. Other constipation This problem is controlled.  She denies current symptoms.  Nolyn was informed that a decrease in bowel movement frequency is normal while losing weight, but stools should not be hard or painful.  Counseling: Getting to Good Bowel Health: Your goal is to have one soft bowel movement each day. Drink at least 8 glasses of water each day. Eat plenty of fiber (goal is over 30 grams each day). It is best to get most of your fiber from dietary sources which includes leafy green vegetables, fresh fruit, and whole grains. You may need to add fiber with the help of OTC fiber supplements. These include Metamucil, Citrucel, and Benefiber. If you are still having trouble, try adding an osmotic laxative such as Miralax. If all of these changes do not work, Dietitian.   2. Vitamin D deficiency Improving, but not optimized. Current vitamin D is 45.8, tested on 07/30/2020. Optimal goal > 50 ng/dL.  She is taking vitamin D 50,000 IU weekly.  Plan: Continue to take prescription Vitamin D @50 ,000 IU every week as prescribed.  Follow-up  for routine testing of Vitamin D, at least 2-3 times per year to avoid over-replacement.  - Refill Vitamin D, Ergocalciferol, (DRISDOL) 1.25 MG (50000 UNIT) CAPS capsule; Take one tablet twice monthly  Dispense: 2 capsule; Refill: 0  3. At risk for impaired metabolic function Due to Susan Dorsey's current state of health and medical condition(s), she is at a significantly higher risk for impaired metabolic function.   At least 9 minutes was spent on counseling Susan Dorsey about these concerns today.  This places the patient at a much greater risk to subsequently develop cardio-pulmonary conditions that can negatively affect the patient's quality of life.  I stressed the importance of reversing these risks factors.  The initial goal is to lose at least 5-10% of starting weight to help reduce risk factors.  Counseling:  Intensive lifestyle modifications discussed with [name] as the most appropriate first line treatment.  she will continue to work on diet, exercise, and weight loss efforts.  We will continue to reassess these conditions on a fairly regular basis in an attempt to decrease the patient's overall morbidity and mortality.  4. Class 3 severe obesity with serious comorbidity and body mass index (BMI) of 40.0 to 44.9 in adult, unspecified obesity type [facility])  Course: Susan Dorsey is currently in the action stage of change. As such, her goal is to continue with weight loss efforts.   Nutrition goals: She has agreed to the Category 2 Plan with breakfast options.  Exercise goals: Continue working out and Runner, broadcasting/film/video along with cardio.  Behavioral modification strategies: increasing lean protein intake and decreasing simple carbohydrates.  Susan Dorsey has agreed to follow-up with our clinic in 3 weeks. She was informed of the importance of frequent follow-up visits to maximize her success with intensive lifestyle modifications for her multiple health conditions.   Objective:   Blood pressure 134/83, pulse 64, temperature 98  F (36.7 C), height 5\' 6"  (1.676 m), weight 267 lb (121.1 kg), SpO2 99 %. Body mass index is 43.09 kg/m.  General: Cooperative, alert, well developed, in no acute distress. HEENT: Conjunctivae and lids unremarkable. Cardiovascular: Regular rhythm.  Lungs: Normal work of breathing. Neurologic: No focal deficits.   Lab Results  Component Value Date   CREATININE 0.85 07/30/2020   BUN 12 07/30/2020   NA 135 07/30/2020   K 4.0 07/30/2020   CL 97 07/30/2020   CO2 25 07/30/2020   Lab Results  Component Value Date   ALT 19 07/30/2020   AST 18 07/30/2020   ALKPHOS 99 07/30/2020   BILITOT 0.5 07/30/2020   Lab Results  Component Value Date   HGBA1C 5.2 07/30/2020   HGBA1C 5.1 09/13/2018   HGBA1C 5.2 01/08/2018   HGBA1C 5.1 07/14/2017   Lab Results  Component Value Date   INSULIN 3.3 07/30/2020   INSULIN 5.3 09/13/2018   INSULIN 5.6 01/08/2018   INSULIN 6.0 07/14/2017   Lab Results  Component Value Date   TSH 3.210 02/15/2021   Lab Results  Component Value Date   CHOL 177 07/30/2020   HDL 57 07/30/2020   LDLCALC 105 (H) 07/30/2020   TRIG 82 07/30/2020   Lab Results  Component Value Date   WBC 7.5 07/30/2020   HGB 14.9 07/30/2020   HCT 44.1 07/30/2020   MCV 96 07/30/2020   PLT 245 07/30/2020   Lab Results  Component Value Date   IRON 76 07/30/2020   TIBC 294 07/30/2020   FERRITIN 91 07/30/2020   Attestation Statements:   Reviewed by clinician on day of visit: allergies, medications, problem list, medical history, surgical history, family history, social history, and previous encounter notes.  I, 09/29/2020, CMA, am acting as Insurance claims handler for Energy manager, DO.  I have reviewed the above documentation for accuracy and completeness, and I agree with the above. [employer], D.O.  The 21st Century Cures Act was signed into law in 2016 which includes the topic of electronic health records.  This provides immediate access to information in  MyChart.  This includes consultation notes, operative notes, office notes, lab results and pathology reports.  If you have any questions about what you read please let 2017 know at your next visit so we can discuss your concerns and take corrective action if need be.  We are right here with you.

## 2021-04-01 ENCOUNTER — Other Ambulatory Visit: Payer: Self-pay

## 2021-04-01 ENCOUNTER — Encounter (INDEPENDENT_AMBULATORY_CARE_PROVIDER_SITE_OTHER): Payer: Self-pay | Admitting: Family Medicine

## 2021-04-01 ENCOUNTER — Ambulatory Visit (INDEPENDENT_AMBULATORY_CARE_PROVIDER_SITE_OTHER): Payer: 59 | Admitting: Family Medicine

## 2021-04-01 VITALS — BP 124/84 | HR 64 | Temp 98.3°F | Ht 66.0 in | Wt 269.0 lb

## 2021-04-01 DIAGNOSIS — E559 Vitamin D deficiency, unspecified: Secondary | ICD-10-CM | POA: Diagnosis not present

## 2021-04-01 DIAGNOSIS — Z6841 Body Mass Index (BMI) 40.0 and over, adult: Secondary | ICD-10-CM | POA: Diagnosis not present

## 2021-04-01 DIAGNOSIS — Z9189 Other specified personal risk factors, not elsewhere classified: Secondary | ICD-10-CM | POA: Diagnosis not present

## 2021-04-06 NOTE — Progress Notes (Signed)
Chief Complaint:   OBESITY Susan Dorsey is here to discuss her progress with her obesity treatment plan along with follow-up of her obesity related diagnoses.   Today's visit was #: 16 Starting weight: 273 lbs Starting date: 07/30/2020 Today's weight: 269 lbs Today's date: 04/01/2021 Total lbs lost to date: 4 lbs Body mass index is 43.42 kg/m.  Total weight loss percentage to date: -1.47%  Interim History:  Susan Dorsey says she has a close friend who was recently diagnosed with terminal cancer.  She has been eating off plan a lot lately.  She has been back on track for 3-4 days now.  Current Meal Plan: the Category 2 Plan with breakfast options.  Current Exercise Plan:  Working with a Systems analyst for 45 minutes 3 times per week.  Assessment/Plan:   1. Vitamin D deficiency Not at goal. Current vitamin D is 45.8, tested on 07/30/2020. Optimal goal > 50 ng/dL.  She is taking vitamin D 50,000 IU twice a month.  Plan: Continue to take prescription Vitamin D @50 ,000 IU twice a month as prescribed.  Follow-up for routine testing of Vitamin D, at least 2-3 times per year to avoid over-replacement.  2. At risk for malnutrition Susan Dorsey was given extensive malnutrition prevention education and counseling today of more than 9 minutes.  Counseled her that malnutrition refers to inappropriate nutrients or not the right balance of nutrients for optimal health.  Discussed with Susan Dorsey Susan Dorsey that it is absolutely possible to be malnourished but yet obese.  Risk factors, including but not limited to, inappropriate dietary choices, difficulty with obtaining food due to physical or financial limitations, and various physical and mental health conditions were reviewed with Susan Dorsey.   3. Obesity with current BMI of 43.4  Course: Susan Dorsey is currently in the action stage of change. As such, her goal is to continue with weight loss efforts.   Nutrition goals: She has agreed to the Category 2 Plan.   Exercise goals:  As is.  Behavioral modification strategies: meal planning and cooking strategies and planning for success.  Susan Dorsey has agreed to follow-up with our clinic in 3-4 weeks. She was informed of the importance of frequent follow-up visits to maximize her success with intensive lifestyle modifications for her multiple health conditions.   Objective:   Blood pressure 124/84, pulse 64, temperature 98.3 F (36.8 C), height 5\' 6"  (1.676 m), weight 269 lb (122 kg), SpO2 100 %. Body mass index is 43.42 kg/m.  General: Cooperative, alert, well developed, in no acute distress. HEENT: Conjunctivae and lids unremarkable. Cardiovascular: Regular rhythm.  Lungs: Normal work of breathing. Neurologic: No focal deficits.   Lab Results  Component Value Date   CREATININE 0.85 07/30/2020   BUN 12 07/30/2020   NA 135 07/30/2020   K 4.0 07/30/2020   CL 97 07/30/2020   CO2 25 07/30/2020   Lab Results  Component Value Date   ALT 19 07/30/2020   AST 18 07/30/2020   ALKPHOS 99 07/30/2020   BILITOT 0.5 07/30/2020   Lab Results  Component Value Date   HGBA1C 5.2 07/30/2020   HGBA1C 5.1 09/13/2018   HGBA1C 5.2 01/08/2018   HGBA1C 5.1 07/14/2017   Lab Results  Component Value Date   INSULIN 3.3 07/30/2020   INSULIN 5.3 09/13/2018   INSULIN 5.6 01/08/2018   INSULIN 6.0 07/14/2017   Lab Results  Component Value Date   TSH 3.210 02/15/2021   Lab Results  Component Value Date  CHOL 177 07/30/2020   HDL 57 07/30/2020   LDLCALC 105 (H) 07/30/2020   TRIG 82 07/30/2020   Lab Results  Component Value Date   WBC 7.5 07/30/2020   HGB 14.9 07/30/2020   HCT 44.1 07/30/2020   MCV 96 07/30/2020   PLT 245 07/30/2020   Lab Results  Component Value Date   IRON 76 07/30/2020   TIBC 294 07/30/2020   FERRITIN 91 07/30/2020   Attestation Statements:   Reviewed by clinician on day of visit: allergies, medications, problem list, medical history, surgical history, family history, social history, and  previous encounter notes.  I, Insurance claims handler, CMA, am acting as Energy manager for [employer], DO.  I have reviewed the above documentation for accuracy and completeness, and I agree with the above. Carlye Grippe, D.O.  The 21st Century Cures Act was signed into law in 2016 which includes the topic of electronic health records.  This provides immediate access to information in MyChart.  This includes consultation notes, operative notes, office notes, lab results and pathology reports.  If you have any questions about what you read please let us know at your next visit so we can discuss your concerns and take corrective action if need be.  We are right here with you.

## 2021-04-29 ENCOUNTER — Encounter (INDEPENDENT_AMBULATORY_CARE_PROVIDER_SITE_OTHER): Payer: Self-pay | Admitting: Family Medicine

## 2021-04-29 ENCOUNTER — Other Ambulatory Visit: Payer: Self-pay

## 2021-04-29 ENCOUNTER — Ambulatory Visit (INDEPENDENT_AMBULATORY_CARE_PROVIDER_SITE_OTHER): Payer: 59 | Admitting: Family Medicine

## 2021-04-29 VITALS — BP 138/84 | HR 54 | Temp 97.8°F | Ht 66.0 in | Wt 268.0 lb

## 2021-04-29 DIAGNOSIS — R030 Elevated blood-pressure reading, without diagnosis of hypertension: Secondary | ICD-10-CM | POA: Diagnosis not present

## 2021-04-29 DIAGNOSIS — Z6841 Body Mass Index (BMI) 40.0 and over, adult: Secondary | ICD-10-CM

## 2021-04-29 DIAGNOSIS — Z9189 Other specified personal risk factors, not elsewhere classified: Secondary | ICD-10-CM | POA: Diagnosis not present

## 2021-04-29 DIAGNOSIS — E66813 Obesity, class 3: Secondary | ICD-10-CM

## 2021-04-29 DIAGNOSIS — E559 Vitamin D deficiency, unspecified: Secondary | ICD-10-CM

## 2021-04-29 MED ORDER — VITAMIN D (ERGOCALCIFEROL) 1.25 MG (50000 UNIT) PO CAPS: ORAL_CAPSULE | ORAL | 0 refills | Status: DC

## 2021-05-06 NOTE — Progress Notes (Signed)
Chief Complaint:   OBESITY Susan Dorsey is here to discuss her progress with her obesity treatment plan along with follow-up of her obesity related diagnoses.   Today's visit was #: 17 Starting weight: 273 lbs Starting date: 07/30/2020 Today's weight: 268 lbs Today's date: 04/29/2021 Weight change since last visit: 1 lb Total lbs lost to date: 5 lbs Body mass index is 43.26 kg/m.  Total weight loss percentage to date: -1.83%  Interim History:  Susan Dorsey has lost 1 pound since her last office visit.  Susan Dorsey went a little overboard over Easter, Susan Dorsey says.  Starting 1 week after Easter, Susan Dorsey has been doing 1400 calories and 100+ grams per day.  Current Meal Plan: the Category 2 Plan for 95% of the time.  Current Exercise Plan: Class at the gym, stationary bike for 60 minutes 3 times per week and walking the dog for 20 minutes 2 times per week.  Assessment/Plan:   Medications Discontinued During This Encounter  Medication Reason  . Vitamin D, Ergocalciferol, (DRISDOL) 1.25 MG (50000 UNIT) CAPS capsule Reorder   Meds ordered this encounter  Medications  . Vitamin D, Ergocalciferol, (DRISDOL) 1.25 MG (50000 UNIT) CAPS capsule    Sig: Take one tablet twice monthly    Dispense:  2 capsule    Refill:  0    1. Vitamin D deficiency Not at goal. Current vitamin D is 45.8, tested on 07/30/2020. Optimal goal > 50 ng/dL.  Susan Dorsey is taking vitamin D 50,000 IU twice monthly.  Plan: Continue to take prescription Vitamin D @50 ,000 IU twice monthly as prescribed.  Follow-up for routine testing of Vitamin D, at least 2-3 times per year to avoid over-replacement.  - Refill Vitamin D, Ergocalciferol, (DRISDOL) 1.25 MG (50000 UNIT) CAPS capsule; Take one tablet twice monthly  Dispense: 2 capsule; Refill: 0  2. Elevated blood pressure reading Blood pressure at home is 120s/70s.  Susan Dorsey checks it randomly or if blood pressure is higher at the doctor's office.  Susan Dorsey has history of hypertension prior to her weight loss  surgery.  Plan:  Check blood pressure at home occasionally (twice a month or so).  Increase water intake and watch salt intake.  BP Readings from Last 3 Encounters:  04/29/21 138/84  04/01/21 124/84  03/10/21 134/83   Lab Results  Component Value Date   CREATININE 0.85 07/30/2020   3. At risk for hypertension Due to Susan Dorsey's current state of health, lifestyle habits and medical condition(s), Susan Dorsey is at a higher risk for developing hypertension.    At least 9 minutes was spent on counseling Susan Dorsey about these concerns today.  I stressed the importance of reversing risks factors for hypertension which include lowering her BMI.  I discussed there are genetic and family factors which cannot be changed, and I encouraged Susan Dorsey to focus on lifestyle choices that can be modified such as engaging in a healthy diet which includes lowering salt intake, increasing activity, limiting alcohol and/or tobacco intake.  Counseling: Intensive lifestyle modifications discussed with Susan Dorsey as most appropriate first line treatment.  Susan Dorsey will continue to work on diet, exercise and weight loss efforts.  We will continue to reassess these conditions on a fairly regular basis in an attempt to decrease patient's overall morbidity and mortality.  4. Obesity, current BMI 43.3  Course: Susan Dorsey is currently in the action stage of change. As such, her goal is to continue with weight loss efforts.   Nutrition goals: Susan Dorsey has agreed to keeping a  food journal and adhering to recommended goals of 1400 calories and 100+ calories protein.   Exercise goals: As is.  Behavioral modification strategies: increasing lean protein intake, decreasing simple carbohydrates, meal planning and cooking strategies, keeping healthy foods in the home and planning for success.  Susan Dorsey has agreed to follow-up with our clinic in 2-3 weeks. Susan Dorsey was informed of the importance of frequent follow-up visits to maximize her success with intensive lifestyle  modifications for her multiple health conditions.   Objective:   Blood pressure 138/84, pulse (!) 54, temperature 97.8 F (36.6 C), height 5\' 6"  (1.676 m), weight 268 lb (121.6 kg), SpO2 98 %. Body mass index is 43.26 kg/m.  General: Cooperative, alert, well developed, in no acute distress. HEENT: Conjunctivae and lids unremarkable. Cardiovascular: Regular rhythm.  Lungs: Normal work of breathing. Neurologic: No focal deficits.   Lab Results  Component Value Date   CREATININE 0.85 07/30/2020   BUN 12 07/30/2020   NA 135 07/30/2020   K 4.0 07/30/2020   CL 97 07/30/2020   CO2 25 07/30/2020   Lab Results  Component Value Date   ALT 19 07/30/2020   AST 18 07/30/2020   ALKPHOS 99 07/30/2020   BILITOT 0.5 07/30/2020   Lab Results  Component Value Date   HGBA1C 5.2 07/30/2020   HGBA1C 5.1 09/13/2018   HGBA1C 5.2 01/08/2018   HGBA1C 5.1 07/14/2017   Lab Results  Component Value Date   INSULIN 3.3 07/30/2020   INSULIN 5.3 09/13/2018   INSULIN 5.6 01/08/2018   INSULIN 6.0 07/14/2017   Lab Results  Component Value Date   TSH 3.210 02/15/2021   Lab Results  Component Value Date   CHOL 177 07/30/2020   HDL 57 07/30/2020   LDLCALC 105 (H) 07/30/2020   TRIG 82 07/30/2020   Lab Results  Component Value Date   WBC 7.5 07/30/2020   HGB 14.9 07/30/2020   HCT 44.1 07/30/2020   MCV 96 07/30/2020   PLT 245 07/30/2020   Lab Results  Component Value Date   IRON 76 07/30/2020   TIBC 294 07/30/2020   FERRITIN 91 07/30/2020   Attestation Statements:   Reviewed by clinician on day of visit: allergies, medications, problem list, medical history, surgical history, family history, social history, and previous encounter notes.  I, 09/29/2020, CMA, am acting as Insurance claims handler for Energy manager, DO.  I have reviewed the above documentation for accuracy and completeness, and I agree with the above. [employer], D.O.  The 21st Century Cures Act was signed into  law in 2016 which includes the topic of electronic health records.  This provides immediate access to information in MyChart.  This includes consultation notes, operative notes, office notes, lab results and pathology reports.  If you have any questions about what you read please let 2017 know at your next visit so we can discuss your concerns and take corrective action if need be.  We are right here with you.

## 2021-05-20 ENCOUNTER — Ambulatory Visit (INDEPENDENT_AMBULATORY_CARE_PROVIDER_SITE_OTHER): Payer: 59 | Admitting: Family Medicine

## 2021-05-20 ENCOUNTER — Encounter (INDEPENDENT_AMBULATORY_CARE_PROVIDER_SITE_OTHER): Payer: Self-pay | Admitting: Family Medicine

## 2021-05-20 ENCOUNTER — Other Ambulatory Visit: Payer: Self-pay

## 2021-05-20 VITALS — BP 127/85 | HR 54 | Temp 97.8°F | Ht 66.0 in | Wt 275.0 lb

## 2021-05-20 DIAGNOSIS — Z6841 Body Mass Index (BMI) 40.0 and over, adult: Secondary | ICD-10-CM

## 2021-05-20 DIAGNOSIS — Z9189 Other specified personal risk factors, not elsewhere classified: Secondary | ICD-10-CM

## 2021-05-20 DIAGNOSIS — E559 Vitamin D deficiency, unspecified: Secondary | ICD-10-CM | POA: Diagnosis not present

## 2021-05-20 MED ORDER — VITAMIN D (ERGOCALCIFEROL) 1.25 MG (50000 UNIT) PO CAPS
ORAL_CAPSULE | ORAL | 0 refills | Status: DC
Start: 2021-05-20 — End: 2021-06-29

## 2021-06-01 NOTE — Progress Notes (Signed)
Chief Complaint:   OBESITY Susan Dorsey is here to discuss her progress with her obesity treatment plan along with follow-up of her obesity related diagnoses.   Today's visit was #: 18 Starting weight: 273 lbs Starting date: 07/30/2020 Today's weight: 275 lbs Today's date: 05/20/2021 Weight change since last visit: +7 lbs Total lbs lost to date: +2 lbs Body mass index is 44.39 kg/m.   Interim History:  Susan Dorsey says that she has not been on a routine for the past 2-3 weeks.  She has not been journaling.  She is disappointed that she gained weight.  She feels like she let herself down.  She will be going on a vacation next week.  Current Meal Plan: the Category 2 Plan with protein equivalents and breakfast options or keeping a food journal and adhering to recommended goals of 1400 calories and 100 grams of protein for 60% of the time.  Current Exercise Plan: Going to the gym for 60 minutes 3 days per week and walking for 20 minutes 2 days per week.  Assessment/Plan:   Medications Discontinued During This Encounter  Medication Reason   Vitamin D, Ergocalciferol, (DRISDOL) 1.25 MG (50000 UNIT) CAPS capsule Reorder    Meds ordered this encounter  Medications   Vitamin D, Ergocalciferol, (DRISDOL) 1.25 MG (50000 UNIT) CAPS capsule    Sig: Take one tablet twice monthly    Dispense:  2 capsule    Refill:  0    1. Vitamin D deficiency Not optimized. Current vitamin D is 45.8, tested on 07/30/2020. Optimal goal > 50 ng/dL.  She is taking vitamin D 50,000 IU twice monthly.  Plan: Continue to take prescription Vitamin D @50 ,000 IU twice monthly as prescribed.  Follow-up for routine testing of Vitamin D, at least 2-3 times per year to avoid over-replacement.  - Refill Vitamin D, Ergocalciferol, (DRISDOL) 1.25 MG (50000 UNIT) CAPS capsule; Take one tablet twice monthly  Dispense: 2 capsule; Refill: 0  2. At risk for depression [name] was given approximately 8 minutes of depression prevention  counseling today due to their higher than average risk for this condition. The patient has several risk factors for depression such as chronic medical conditions, sleep issues, major life stressors/events, etc., and we discussed these today.  [name] was also counseled on the importance of a healthy work-life balance, a healthy relationship with food, and a good support system.  We discussed various strategies to help cope with these emotions as well.  I recommended counseling, meditation or prayer, healthy eating habits, sleep hygiene, and exercising to help manage these feelings.   3. Obesity, current 44.4  Course: Susan Dorsey is currently in the action stage of change. As such, her goal is to continue with weight loss efforts.   Nutrition goals: She has agreed to the Category 2 Plan with protein equivalents and breakfast options or keeping a food journal and adhering to recommended goals of 1400 calories and 100 grams of protein.   Exercise goals:  As is.  Behavioral modification strategies: travel eating strategies and holiday eating strategies .  [name] has agreed to follow-up with our clinic in 3 weeks. She was informed of the importance of frequent follow-up visits to maximize her success with intensive lifestyle modifications for her multiple health conditions.   Objective:   Blood pressure 127/85, pulse (!) 54, temperature 97.8 F (36.6 C), height 5\' 6"  (1.676 m), weight 275 lb (124.7 kg), SpO2 98 %. Body mass index is 44.39  kg/m.  General: Cooperative, alert, well developed, in no acute distress. HEENT: Conjunctivae and lids unremarkable. Cardiovascular: Regular rhythm.  Lungs: Normal work of breathing. Neurologic: No focal deficits.   Lab Results  Component Value Date   CREATININE 0.85 07/30/2020   BUN 12 07/30/2020   NA 135 07/30/2020   K 4.0 07/30/2020   CL 97 07/30/2020   CO2 25 07/30/2020   Lab Results  Component Value Date   ALT 19 07/30/2020   AST 18 07/30/2020    ALKPHOS 99 07/30/2020   BILITOT 0.5 07/30/2020   Lab Results  Component Value Date   HGBA1C 5.2 07/30/2020   HGBA1C 5.1 09/13/2018   HGBA1C 5.2 01/08/2018   HGBA1C 5.1 07/14/2017   Lab Results  Component Value Date   INSULIN 3.3 07/30/2020   INSULIN 5.3 09/13/2018   INSULIN 5.6 01/08/2018   INSULIN 6.0 07/14/2017   Lab Results  Component Value Date   TSH 3.210 02/15/2021   Lab Results  Component Value Date   CHOL 177 07/30/2020   HDL 57 07/30/2020   LDLCALC 105 (H) 07/30/2020   TRIG 82 07/30/2020   Lab Results  Component Value Date   WBC 7.5 07/30/2020   HGB 14.9 07/30/2020   HCT 44.1 07/30/2020   MCV 96 07/30/2020   PLT 245 07/30/2020   Lab Results  Component Value Date   IRON 76 07/30/2020   TIBC 294 07/30/2020   FERRITIN 91 07/30/2020   Attestation Statements:   Reviewed by clinician on day of visit: allergies, medications, problem list, medical history, surgical history, family history, social history, and previous encounter notes.  I, Insurance claims handler, CMA, am acting as Energy manager for [employer], DO.  I have reviewed the above documentation for accuracy and completeness, and I agree with the above. Carlye Grippe, D.O.  The 21st Century Cures Act was signed into law in 2016 which includes the topic of electronic health records.  This provides immediate access to information in MyChart.  This includes consultation notes, operative notes, office notes, lab results and pathology reports.  If you have any questions about what you read please let us know at your next visit so we can discuss your concerns and take corrective action if need be.  We are right here with you.

## 2021-06-14 ENCOUNTER — Ambulatory Visit (INDEPENDENT_AMBULATORY_CARE_PROVIDER_SITE_OTHER): Payer: 59 | Admitting: Family Medicine

## 2021-06-29 ENCOUNTER — Ambulatory Visit (INDEPENDENT_AMBULATORY_CARE_PROVIDER_SITE_OTHER): Payer: 59 | Admitting: Family Medicine

## 2021-06-29 ENCOUNTER — Other Ambulatory Visit: Payer: Self-pay

## 2021-06-29 ENCOUNTER — Encounter (INDEPENDENT_AMBULATORY_CARE_PROVIDER_SITE_OTHER): Payer: Self-pay | Admitting: Family Medicine

## 2021-06-29 VITALS — BP 144/85 | HR 61 | Temp 98.3°F | Ht 66.0 in | Wt 269.0 lb

## 2021-06-29 DIAGNOSIS — E8881 Metabolic syndrome: Secondary | ICD-10-CM | POA: Diagnosis not present

## 2021-06-29 DIAGNOSIS — Z9189 Other specified personal risk factors, not elsewhere classified: Secondary | ICD-10-CM

## 2021-06-29 DIAGNOSIS — M25562 Pain in left knee: Secondary | ICD-10-CM | POA: Diagnosis not present

## 2021-06-29 DIAGNOSIS — E559 Vitamin D deficiency, unspecified: Secondary | ICD-10-CM

## 2021-06-29 DIAGNOSIS — E7849 Other hyperlipidemia: Secondary | ICD-10-CM | POA: Diagnosis not present

## 2021-06-29 DIAGNOSIS — Z6841 Body Mass Index (BMI) 40.0 and over, adult: Secondary | ICD-10-CM

## 2021-06-29 DIAGNOSIS — E538 Deficiency of other specified B group vitamins: Secondary | ICD-10-CM

## 2021-06-29 MED ORDER — VITAMIN D (ERGOCALCIFEROL) 1.25 MG (50000 UNIT) PO CAPS: ORAL_CAPSULE | ORAL | 0 refills | Status: DC

## 2021-07-08 NOTE — Progress Notes (Signed)
Chief Complaint:   OBESITY Susan Dorsey is here to discuss her progress with her obesity treatment plan along with follow-up of her obesity related diagnoses.   Today's visit was #: 19 Starting weight: 273 lbs Starting date: 07/30/2020 Today's weight: 269 lbs Today's date: 06/29/2021 Weight change since last visit: 6 lbs Total lbs lost to date: 4 lbs Body mass index is 43.42 kg/m.  Total weight loss percentage to date: -1.47%  Interim History:  Susan Dorsey is getting 1200-1400 calories with 90 grams of protein per day most days.  She really buckled down.  She is doing very well with tracking/journaling.  Current Meal Plan: the Category 2 Plan or keeping a food journal and adhering to recommended goals of 1400 calories and 100 grams of protein for 95% of the time.  Current Exercise Plan: Walking for 15 minutes 2 times per week and going to the gym for 60 minutes 3 times per week.  Assessment/Plan:   Orders Placed This Encounter  Procedures   Hemoglobin A1c   Insulin, random   Lipid Panel With LDL/HDL Ratio   Vitamin B12   VITAMIN D 25 Hydroxy (Vit-D Deficiency, Fractures)   Medications Discontinued During This Encounter  Medication Reason   Vitamin D, Ergocalciferol, (DRISDOL) 1.25 MG (50000 UNIT) CAPS capsule Reorder   Meds ordered this encounter  Medications   Vitamin D, Ergocalciferol, (DRISDOL) 1.25 MG (50000 UNIT) CAPS capsule    Sig: Take one tablet twice monthly    Dispense:  2 capsule    Refill:  0   1. Vitamin D deficiency Not at goal.  She is taking vitamin D 50,000 IU twice monthly.  Plan: Continue to take prescription Vitamin D @50 ,000 IU twice monthly as prescribed.  Check vitamin D level next office visit.  Lab Results  Component Value Date   VD25OH 45.8 07/30/2020   VD25OH 45.5 09/13/2018   VD25OH 44.8 01/08/2018   - Refill Vitamin D, Ergocalciferol, (DRISDOL) 1.25 MG (50000 UNIT) CAPS capsule; Take one tablet twice monthly  Dispense: 2 capsule; Refill: 0 -  VITAMIN D 25 Hydroxy (Vit-D Deficiency, Fractures)  2. Left knee pain, unspecified chronicity Followed by Dr. 01/10/2018 at [facility].  Gave her injections in left knee in January.  Now hurting for 2-3 weeks.  Going again for possible injections soon.  Plan: Cont PNP and wt loss.  Ice prn for pain, and exercise as tolerated per ortho recs and restrictions  3. Insulin resistance At goal. Goal is HgbA1c < 5.7, fasting insulin closer to 5.  Medication: None.  Insurance will not cover GLP-1 or others (no weight loss medication coverage).  Plan:  She will continue to focus on protein-rich, low simple carbohydrate foods. We reviewed the importance of hydration, regular exercise for stress reduction, and restorative sleep.  Check fasting insulin and A1c at next office visit.  Lab Results  Component Value Date   HGBA1C 5.2 07/30/2020   Lab Results  Component Value Date   INSULIN 3.3 07/30/2020   INSULIN 5.3 09/13/2018   INSULIN 5.6 01/08/2018   INSULIN 6.0 07/14/2017   - Hemoglobin A1c - Insulin, random  4. Other hyperlipidemia Course: at goal/ controlled.  Lipid-lowering medications: None.  Diet controlled.  Elevated LDL.   Plan: Dietary changes: Increase soluble fiber, decrease simple carbohydrates, decrease saturated fat. Exercise changes: Moderate to vigorous-intensity aerobic activity 150 minutes per week or as tolerated. We will continue to monitor along with PCP/specialists as it pertains to her weight loss journey.  Check FLP at next office visit.   Lab Results  Component Value Date   CHOL 177 07/30/2020   HDL 57 07/30/2020   LDLCALC 105 (H) 07/30/2020   TRIG 82 07/30/2020   Lab Results  Component Value Date   ALT 19 07/30/2020   AST 18 07/30/2020   ALKPHOS 99 07/30/2020   BILITOT 0.5 07/30/2020   The 10-year ASCVD risk score Denman George DC Jr., et al., 2013) is: 3.1%   Values used to calculate the score:     Age: 30 years     Sex: Female     Is Non-Hispanic African  American: No     Diabetic: No     Tobacco smoker: No     Systolic Blood Pressure: 144 mmHg     Is BP treated: No     HDL Cholesterol: 57 mg/dL     Total Cholesterol: 185 mg/dL  - Lipid Panel With LDL/HDL Ratio  5. B12 nutritional deficiency Lab Results  Component Value Date   VITAMINB12 1,796 (H) 07/30/2020   Supplementation: Daily multivitamin. Status post gastric sleeve.  Plan:  Continue multivitamin and recheck her B12 at next office visit.   - Vitamin B12  6. At risk for diabetes mellitus - Susan Dorsey was given diabetes prevention education and counseling today of more than 9 minutes.  - Counseled patient on pathophysiology of disease and meaning/ implication of lab results.  - Reviewed how certain foods can either stimulate or inhibit insulin release, and subsequently affect hunger pathways  - Importance of following a healthy meal plan with limiting amounts of simple carbohydrates discussed with patient - Effects of regular aerobic exercise on blood sugar regulation reviewed and encouraged an eventual goal of 30 min 5d/week or more as a minimum.  - Briefly discussed treatment options, which always include dietary and lifestyle modification as first line.   - Handouts provided at patient's desire and/or told to go online to the American Diabetes Association website for further information.  7. Obesity, current 44.4  Course: [name] is currently in the action stage of change. As such, her goal is to continue with weight loss efforts.   Nutrition goals: She has agreed to the Category 2 Plan or keeping a food journal and adhering to recommended goals of 1400 calories and 90 grams of protein.   Exercise goals:  As is.  Behavioral modification strategies: avoiding temptations and keeping a strict food journal.  [name] has agreed to follow-up with our clinic in 3-4 weeks.  She will come prior for blood work.  She was informed of the importance of frequent follow-up visits to maximize her  success with intensive lifestyle modifications for her multiple health conditions.   Objective:   Blood pressure (!) 144/85, pulse 61, temperature 98.3 F (36.8 C), height 5\' 6"  (1.676 m), weight 269 lb (122 kg), SpO2 98 %. Body mass index is 43.42 kg/m.  General: Cooperative, alert, well developed, in no acute distress. HEENT: Conjunctivae and lids unremarkable. Cardiovascular: Regular rhythm.  Lungs: Normal work of breathing. Neurologic: No focal deficits.   Lab Results  Component Value Date   CREATININE 0.85 07/30/2020   BUN 12 07/30/2020   NA 135 07/30/2020   K 4.0 07/30/2020   CL 97 07/30/2020   CO2 25 07/30/2020   Lab Results  Component Value Date   ALT 19 07/30/2020   AST 18 07/30/2020   ALKPHOS 99 07/30/2020   BILITOT 0.5 07/30/2020   Lab Results  Component Value Date  HGBA1C 5.2 07/30/2020   HGBA1C 5.1 09/13/2018   HGBA1C 5.2 01/08/2018   HGBA1C 5.1 07/14/2017   Lab Results  Component Value Date   INSULIN 3.3 07/30/2020   INSULIN 5.3 09/13/2018   INSULIN 5.6 01/08/2018   INSULIN 6.0 07/14/2017   Lab Results  Component Value Date   TSH 3.210 02/15/2021   Lab Results  Component Value Date   CHOL 177 07/30/2020   HDL 57 07/30/2020   LDLCALC 105 (H) 07/30/2020   TRIG 82 07/30/2020   Lab Results  Component Value Date   VD25OH 45.8 07/30/2020   VD25OH 45.5 09/13/2018   VD25OH 44.8 01/08/2018   Lab Results  Component Value Date   WBC 7.5 07/30/2020   HGB 14.9 07/30/2020   HCT 44.1 07/30/2020   MCV 96 07/30/2020   PLT 245 07/30/2020   Lab Results  Component Value Date   IRON 76 07/30/2020   TIBC 294 07/30/2020   FERRITIN 91 07/30/2020   Attestation Statements:   Reviewed by clinician on day of visit: allergies, medications, problem list, medical history, surgical history, family history, social history, and previous encounter notes.  I, Insurance claims handler, CMA, am acting as Energy manager for [employer], DO.  I have reviewed the  above documentation for accuracy and completeness, and I agree with the above. Carlye Grippe, D.O.  The 21st Century Cures Act was signed into law in 2016 which includes the topic of electronic health records.  This provides immediate access to information in MyChart.  This includes consultation notes, operative notes, office notes, lab results and pathology reports.  If you have any questions about what you read please let us know at your next visit so we can discuss your concerns and take corrective action if need be.  We are right here with you.

## 2021-07-21 LAB — LIPID PANEL WITH LDL/HDL RATIO
Cholesterol, Total: 199 mg/dL (ref 100–199)
HDL: 56 mg/dL (ref >39)
LDL Chol Calc (NIH): 124 mg/dL — ABNORMAL HIGH (ref 0–99)
LDL/HDL Ratio: 2.2 ratio (ref 0.0–3.2)
Triglycerides: 108 mg/dL (ref 0–149)
VLDL Cholesterol Cal: 19 mg/dL (ref 5–40)

## 2021-07-21 LAB — HEMOGLOBIN A1C
Est. average glucose Bld gHb Est-mCnc: 105 mg/dL
Hgb A1c MFr Bld: 5.3 % (ref 4.8–5.6)

## 2021-07-21 LAB — INSULIN, RANDOM: INSULIN: 3.1 u[IU]/mL (ref 2.6–24.9)

## 2021-07-21 LAB — VITAMIN D 25 HYDROXY (VIT D DEFICIENCY, FRACTURES): Vit D, 25-Hydroxy: 60.3 ng/mL (ref 30.0–100.0)

## 2021-07-21 LAB — VITAMIN B12: Vitamin B-12: 1230 pg/mL (ref 232–1245)

## 2021-07-29 ENCOUNTER — Encounter (INDEPENDENT_AMBULATORY_CARE_PROVIDER_SITE_OTHER): Payer: Self-pay | Admitting: Family Medicine

## 2021-07-29 ENCOUNTER — Ambulatory Visit (INDEPENDENT_AMBULATORY_CARE_PROVIDER_SITE_OTHER): Payer: 59 | Admitting: Family Medicine

## 2021-07-29 ENCOUNTER — Other Ambulatory Visit: Payer: Self-pay

## 2021-07-29 VITALS — BP 130/86 | HR 57 | Temp 98.6°F | Ht 66.0 in | Wt 266.0 lb

## 2021-07-29 DIAGNOSIS — E7849 Other hyperlipidemia: Secondary | ICD-10-CM | POA: Diagnosis not present

## 2021-07-29 DIAGNOSIS — E538 Deficiency of other specified B group vitamins: Secondary | ICD-10-CM

## 2021-07-29 DIAGNOSIS — Z9189 Other specified personal risk factors, not elsewhere classified: Secondary | ICD-10-CM

## 2021-07-29 DIAGNOSIS — Z6841 Body Mass Index (BMI) 40.0 and over, adult: Secondary | ICD-10-CM

## 2021-07-29 DIAGNOSIS — E559 Vitamin D deficiency, unspecified: Secondary | ICD-10-CM

## 2021-07-29 DIAGNOSIS — E8881 Metabolic syndrome: Secondary | ICD-10-CM | POA: Diagnosis not present

## 2021-07-29 MED ORDER — VITAMIN D (ERGOCALCIFEROL) 1.25 MG (50000 UNIT) PO CAPS: ORAL_CAPSULE | ORAL | 0 refills | Status: DC

## 2021-07-29 NOTE — Patient Instructions (Signed)
The 10-year ASCVD risk score Denman George DC Montez Hageman., et al., 2013) is: 2.7%   Values used to calculate the score:     Age: 60 years     Sex: Female     Is Non-Hispanic African American: No     Diabetic: No     Tobacco smoker: No     Systolic Blood Pressure: 130 mmHg     Is BP treated: No     HDL Cholesterol: 56 mg/dL     Total Cholesterol: 199 mg/dL

## 2021-08-02 NOTE — Progress Notes (Signed)
**Note Susan-Identified via Obfuscation** Chief Complaint:   OBESITY Susan Dorsey is here to discuss her progress with her obesity treatment plan along with follow-up of her obesity related diagnoses. Susan Dorsey is on the Category 2 Plan and keeping a food journal and adhering to recommended goals of 1400 calories and 90 grams protein and states she is following her eating plan approximately 80% of the time. Susan Dorsey states she is going to the gym, biking, and cardio 60 minutes 3 times per week.  Today's visit was #: 20 Starting weight: 273 lbs Starting date: 07/30/2020 Today's weight: 266 lbs Today's date: 07/29/2021 Total lbs lost to date: 7 Total lbs lost since last in-office visit: 3  Interim History: Susan Dorsey is here for a follow up office visit.  We reviewed her meal plan and questions were answered.  Patient's food recall appears to be accurate and consistent with what is on plan when she is following it.   When eating on plan, her hunger and cravings are well controlled.    Subjective:   1. Other hyperlipidemia Worsening. Discussed labs with patient today. Susan Dorsey's triglycerides are than prior and HDL is slightly lower. She is not on statin therapy.  Lab Results  Component Value Date   CHOL 199 07/20/2021   HDL 56 07/20/2021   LDLCALC 124 (H) 07/20/2021   TRIG 108 07/20/2021   Lab Results  Component Value Date   ALT 19 07/30/2020   AST 18 07/30/2020   ALKPHOS 99 07/30/2020   BILITOT 0.5 07/30/2020   The 10-year ASCVD risk score Denman George DC Jr., et al., 2013) is: 2.7%   Values used to calculate the score:     Age: 60 years     Sex: Female     Is Non-Hispanic African American: No     Diabetic: No     Tobacco smoker: No     Systolic Blood Pressure: 130 mmHg     Is BP treated: No     HDL Cholesterol: 56 mg/dL     Total Cholesterol: 199 mg/dL  2. Vitamin D deficiency Discussed labs with patient today. At goal. She is currently taking prescription vitamin D 50,000 IU each week with good compliance. She denies nausea,  vomiting or muscle weakness.  Lab Results  Component Value Date   VD25OH 60.3 07/20/2021   VD25OH 45.8 07/30/2020   VD25OH 45.5 09/13/2018   3. Insulin resistance Discussed labs with patient today. Brynleigh has a diagnosis of insulin resistance based on her elevated fasting insulin level >5. She continues to work on diet and exercise to decrease her risk of diabetes.  Lab Results  Component Value Date   INSULIN 3.1 07/20/2021   INSULIN 3.3 07/30/2020   INSULIN 5.3 09/13/2018   INSULIN 5.6 01/08/2018   INSULIN 6.0 07/14/2017   Lab Results  Component Value Date   HGBA1C 5.3 07/20/2021   4. B12 nutritional deficiency Discussed labs with patient today. Deshanae's reports her energy level is good.   Lab Results  Component Value Date   VITAMINB12 1,230 07/20/2021   Assessment/Plan:  No orders of the defined types were placed in this encounter.   Medications Discontinued During This Encounter  Medication Reason   Vitamin D, Ergocalciferol, (DRISDOL) 1.25 MG (50000 UNIT) CAPS capsule Reorder     Meds ordered this encounter  Medications   Vitamin D, Ergocalciferol, (DRISDOL) 1.25 MG (50000 UNIT) CAPS capsule    Sig: Take one tablet twice monthly    Dispense:  2 capsule  Refill:  0     1. Other hyperlipidemia Cardiovascular risk and specific lipid/LDL goals reviewed.  We discussed several lifestyle modifications today and Susan Dorsey will continue to work on diet, decrease sat/trans fats, continue to exercise and weight loss efforts. Orders and follow up as documented in patient record.   Counseling Intensive lifestyle modifications are the first line treatment for this issue. Dietary changes: Increase soluble fiber. Decrease simple carbohydrates. Exercise changes: Moderate to vigorous-intensity aerobic activity 150 minutes per week if tolerated. Lipid-lowering medications: see documented in medical record.  2. Vitamin D deficiency Low Vitamin D level contributes to fatigue and are  associated with obesity, breast, and colon cancer. She agrees to continue to take prescription Vitamin D 50,000 IU every week and will follow-up for routine testing of Vitamin D, at least 2-3 times per year to avoid over-replacement.  Refill- Vitamin D, Ergocalciferol, (DRISDOL) 1.25 MG (50000 UNIT) CAPS capsule; Take one tablet twice monthly  Dispense: 2 capsule; Refill: 0  3. Insulin resistance Improving from prior. Susan Dorsey will continue to work on weight loss via prudent nutritional plan, exercise, and decreasing simple carbohydrates to help decrease the risk of diabetes. Susan Dorsey agreed to follow-up with [place] as directed to closely monitor her progress.  4. B12 nutritional deficiency At goal. Continue supplementation as is. The diagnosis was reviewed with the patient. Counseling provided today, see below. We will continue to monitor. Orders and follow up as documented in patient record.  Counseling The body needs vitamin B12: to make red blood cells; to make DNA; and to help the nerves work properly so they can carry messages from the brain to the body.  The main causes of vitamin B12 deficiency include dietary deficiency, digestive diseases, pernicious anemia, and having a surgery in which part of the stomach or small intestine is removed.  Certain medicines can make it harder for the body to absorb vitamin B12. These medicines include: heartburn medications; some antibiotics; some medications used to treat diabetes, gout, and high cholesterol.  In some cases, there are no symptoms of this condition. If the condition leads to anemia or nerve damage, various symptoms can occur, such as weakness or fatigue, shortness of breath, and numbness or tingling in your hands and feet.   Treatment:  May include taking vitamin B12 supplements.  Avoid alcohol.  Eat lots of healthy foods that contain vitamin B12: Beef, pork, chicken, [place], and organ meats, such as liver.  Seafood: This includes clams, rainbow  trout, salmon, tuna, and haddock. Eggs.  Cereal and dairy products that are fortified: This means that vitamin B12 has been added to the food.   5. At risk for impaired metabolic function Due to Susan Dorsey's current state of health and medical condition(s), she is at a significantly higher risk for impaired metabolic function.   At least 23 minutes was spent on counseling Susan Dorsey about these concerns today.  This places the patient at a much greater risk to subsequently develop cardio-pulmonary conditions that can negatively affect the patient's quality of life.  I stressed the importance of reversing these risks factors.  The initial goal is to lose at least 5-10% of starting weight to help reduce risk factors.  Counseling:  Intensive lifestyle modifications discussed with Susan Dorsey as the most appropriate first line treatment.  she will continue to work on diet, exercise, and weight loss efforts.  We will continue to reassess these conditions on a fairly regular basis in an attempt to decrease the patient's overall morbidity and mortality.  6. Obesity with current BMI 43.0  Susan Dorsey is currently in the action stage of change. As such, her goal is to continue with weight loss efforts. She has agreed to the Category 2 Plan and keeping a food journal and adhering to recommended goals of 1400 calories and 90 grams protein.   Bring log of date, calorie, protein for each day to next OV.  Exercise goals:  Alter exercises and intensity of exercise (ie. HIIT)  Behavioral modification strategies: meal planning and cooking strategies, better snacking choices, and planning for success.  Susan Dorsey has agreed to follow-up with our clinic in 2-3 weeks. She was informed of the importance of frequent follow-up visits to maximize her success with intensive lifestyle modifications for her multiple health conditions.   Objective:   Blood pressure 130/86, pulse (!) 57, temperature 98.6 F (37 C), height 5\' 6"  (1.676 m), weight 266 lb (120.7  kg), SpO2 99 %. Body mass index is 42.93 kg/m.  General: Cooperative, alert, well developed, in no acute distress. HEENT: Conjunctivae and lids unremarkable. Cardiovascular: Regular rhythm.  Lungs: Normal work of breathing. Neurologic: No focal deficits.   Lab Results  Component Value Date   CREATININE 0.85 07/30/2020   BUN 12 07/30/2020   NA 135 07/30/2020   K 4.0 07/30/2020   CL 97 07/30/2020   CO2 25 07/30/2020   Lab Results  Component Value Date   ALT 19 07/30/2020   AST 18 07/30/2020   ALKPHOS 99 07/30/2020   BILITOT 0.5 07/30/2020   Lab Results  Component Value Date   HGBA1C 5.3 07/20/2021   HGBA1C 5.2 07/30/2020   HGBA1C 5.1 09/13/2018   HGBA1C 5.2 01/08/2018   HGBA1C 5.1 07/14/2017   Lab Results  Component Value Date   INSULIN 3.1 07/20/2021   INSULIN 3.3 07/30/2020   INSULIN 5.3 09/13/2018   INSULIN 5.6 01/08/2018   INSULIN 6.0 07/14/2017   Lab Results  Component Value Date   TSH 3.210 02/15/2021   Lab Results  Component Value Date   CHOL 199 07/20/2021   HDL 56 07/20/2021   LDLCALC 124 (H) 07/20/2021   TRIG 108 07/20/2021   Lab Results  Component Value Date   VD25OH 60.3 07/20/2021   VD25OH 45.8 07/30/2020   VD25OH 45.5 09/13/2018   Lab Results  Component Value Date   WBC 7.5 07/30/2020   HGB 14.9 07/30/2020   HCT 44.1 07/30/2020   MCV 96 07/30/2020   PLT 245 07/30/2020   Lab Results  Component Value Date   IRON 76 07/30/2020   TIBC 294 07/30/2020   FERRITIN 91 07/30/2020   Attestation Statements:   Reviewed by clinician on day of visit: allergies, medications, problem list, medical history, surgical history, family history, social history, and previous encounter notes.  09/29/2020, CMA, am acting as transcriptionist for Edmund Hilda, DO.  I have reviewed the above documentation for accuracy and completeness, and I agree with the above. [employer], D.O.  The 21st Century Cures Act was signed into law in  2016 which includes the topic of electronic health records.  This provides immediate access to information in MyChart.  This includes consultation notes, operative notes, office notes, lab results and pathology reports.  If you have any questions about what you read please let 2017 know at your next visit so we can discuss your concerns and take corrective action if need be.  We are right here with you.

## 2021-08-17 ENCOUNTER — Other Ambulatory Visit: Payer: Self-pay

## 2021-08-17 ENCOUNTER — Ambulatory Visit (INDEPENDENT_AMBULATORY_CARE_PROVIDER_SITE_OTHER): Payer: 59 | Admitting: Family Medicine

## 2021-08-17 ENCOUNTER — Encounter (INDEPENDENT_AMBULATORY_CARE_PROVIDER_SITE_OTHER): Payer: Self-pay | Admitting: Family Medicine

## 2021-08-17 VITALS — BP 156/96 | HR 58 | Temp 98.3°F | Ht 66.0 in | Wt 266.0 lb

## 2021-08-17 DIAGNOSIS — Z6841 Body Mass Index (BMI) 40.0 and over, adult: Secondary | ICD-10-CM

## 2021-08-17 DIAGNOSIS — E559 Vitamin D deficiency, unspecified: Secondary | ICD-10-CM | POA: Diagnosis not present

## 2021-08-17 DIAGNOSIS — Z9189 Other specified personal risk factors, not elsewhere classified: Secondary | ICD-10-CM

## 2021-08-17 MED ORDER — VITAMIN D (ERGOCALCIFEROL) 1.25 MG (50000 UNIT) PO CAPS: ORAL_CAPSULE | ORAL | 0 refills | Status: DC

## 2021-08-18 NOTE — Progress Notes (Signed)
Chief Complaint:   OBESITY Elayne is here to discuss her progress with her obesity treatment plan along with follow-up of her obesity related diagnoses. Analiah is on the Category 2 Plan or keeping a food journal and adhering to recommended goals of 1400 calories and 90 grams of protein daily and states she is following her eating plan approximately 85% of the time. Indiyah states she is at the gym for 60 minutes 3 times per week.  Today's visit was #: 21 Starting weight: 273 lbs Starting date: 07/30/2020 Today's weight: 266 lbs Today's date: 08/17/2021 Total lbs lost to date: 7 Total lbs lost since last in-office visit: 0  Interim History: Luke hit her calorie goal for 8 days in the past 2-3 weeks with protein goals, but she was over in calories approximately 10-11 days during that time. She traveled to [place] and she was out of town eating out which was challenging to know exactly what to journal.  Subjective:   1. Vitamin D deficiency Antonya is currently taking prescription vitamin D 50,000 IU every 14 days. She denies nausea, vomiting or muscle weakness.  2. At risk for impaired metabolic function Binta is at increased risk for impaired metabolic function due to current nutrition and muscle mass.   Assessment/Plan:  No orders of the defined types were placed in this encounter.   Medications Discontinued During This Encounter  Medication Reason   Vitamin D, Ergocalciferol, (DRISDOL) 1.25 MG (50000 UNIT) CAPS capsule Reorder     Meds ordered this encounter  Medications   Vitamin D, Ergocalciferol, (DRISDOL) 1.25 MG (50000 UNIT) CAPS capsule    Sig: Take one tablet twice monthly    Dispense:  2 capsule    Refill:  0     1. Vitamin D deficiency Low Vitamin D level contributes to fatigue and are associated with obesity, breast, and colon cancer. We will refill prescription Vitamin D for 1 month. Rakesha will follow-up for routine testing of Vitamin D, at least 2-3 times per year to avoid  over-replacement.  - Vitamin D, Ergocalciferol, (DRISDOL) 1.25 MG (50000 UNIT) CAPS capsule; Take one tablet twice monthly  Dispense: 2 capsule; Refill: 0  2. At risk for impaired metabolic function Due to Lille's current state of health and medical condition(s), she is at a significantly higher risk for impaired metabolic function.   At least 8 minutes was spent on counseling Tenecia about these concerns today.  This places the patient at a much greater risk to subsequently develop cardio-pulmonary conditions that can negatively affect the patient's quality of life.  I stressed the importance of reversing these risks factors.  The initial goal is to lose at least 5-10% of starting weight to help reduce risk factors.  Counseling:  Intensive lifestyle modifications discussed with Aashka as the most appropriate first line treatment.  she will continue to work on diet, exercise, and weight loss efforts.  We will continue to reassess these conditions on a fairly regular basis in an attempt to decrease the patient's overall morbidity and mortality.  3. Obesity with current BMI of 43.1 Rhesa is currently in the action stage of change. As such, her goal is to continue with weight loss efforts. She has agreed to the Category 2 Plan or keeping a food journal and adhering to recommended goals of 1400 calories and 90 grams of protein daily.   Exercise goals: For substantial health benefits, adults should do at least 150 minutes (2 hours and 30 minutes) a  week of moderate-intensity, or 75 minutes (1 hour and 15 minutes) a week of vigorous-intensity aerobic physical activity, or an equivalent combination of moderate- and vigorous-intensity aerobic activity. Aerobic activity should be performed in episodes of at least 10 minutes, and preferably, it should be spread throughout the week. Try to add walking on days she is not at the gym.  Behavioral modification strategies: increasing lean protein intake and decreasing simple  carbohydrates.  Agata has agreed to follow-up with our clinic in 3 weeks. She was informed of the importance of frequent follow-up visits to maximize her success with intensive lifestyle modifications for her multiple health conditions.   Objective:   Blood pressure (!) 156/96, pulse (!) 58, temperature 98.3 F (36.8 C), height 5\' 6"  (1.676 m), weight 266 lb (120.7 kg), SpO2 99 %. Body mass index is 42.93 kg/m.  General: Cooperative, alert, well developed, in no acute distress. HEENT: Conjunctivae and lids unremarkable. Cardiovascular: Regular rhythm.  Lungs: Normal work of breathing. Neurologic: No focal deficits.   Lab Results  Component Value Date   CREATININE 0.85 07/30/2020   BUN 12 07/30/2020   NA 135 07/30/2020   K 4.0 07/30/2020   CL 97 07/30/2020   CO2 25 07/30/2020   Lab Results  Component Value Date   ALT 19 07/30/2020   AST 18 07/30/2020   ALKPHOS 99 07/30/2020   BILITOT 0.5 07/30/2020   Lab Results  Component Value Date   HGBA1C 5.3 07/20/2021   HGBA1C 5.2 07/30/2020   HGBA1C 5.1 09/13/2018   HGBA1C 5.2 01/08/2018   HGBA1C 5.1 07/14/2017   Lab Results  Component Value Date   INSULIN 3.1 07/20/2021   INSULIN 3.3 07/30/2020   INSULIN 5.3 09/13/2018   INSULIN 5.6 01/08/2018   INSULIN 6.0 07/14/2017   Lab Results  Component Value Date   TSH 3.210 02/15/2021   Lab Results  Component Value Date   CHOL 199 07/20/2021   HDL 56 07/20/2021   LDLCALC 124 (H) 07/20/2021   TRIG 108 07/20/2021   Lab Results  Component Value Date   VD25OH 60.3 07/20/2021   VD25OH 45.8 07/30/2020   VD25OH 45.5 09/13/2018   Lab Results  Component Value Date   WBC 7.5 07/30/2020   HGB 14.9 07/30/2020   HCT 44.1 07/30/2020   MCV 96 07/30/2020   PLT 245 07/30/2020   Lab Results  Component Value Date   IRON 76 07/30/2020   TIBC 294 07/30/2020   FERRITIN 91 07/30/2020   Attestation Statements:   Reviewed by clinician on day of visit: allergies, medications,  problem list, medical history, surgical history, family history, social history, and previous encounter notes.   09/29/2020, am acting as transcriptionist for Trude Mcburney, DO.  I have reviewed the above documentation for accuracy and completeness, and I agree with the above. [employer], D.O.  The 21st Century Cures Act was signed into law in 2016 which includes the topic of electronic health records.  This provides immediate access to information in MyChart.  This includes consultation notes, operative notes, office notes, lab results and pathology reports.  If you have any questions about what you read please let 2017 know at your next visit so we can discuss your concerns and take corrective action if need be.  We are right here with you.

## 2021-09-08 ENCOUNTER — Ambulatory Visit (INDEPENDENT_AMBULATORY_CARE_PROVIDER_SITE_OTHER): Payer: 59 | Admitting: Family Medicine

## 2021-09-29 ENCOUNTER — Other Ambulatory Visit: Payer: Self-pay

## 2021-09-29 ENCOUNTER — Ambulatory Visit (INDEPENDENT_AMBULATORY_CARE_PROVIDER_SITE_OTHER): Payer: 59 | Admitting: Family Medicine

## 2021-09-29 ENCOUNTER — Encounter (INDEPENDENT_AMBULATORY_CARE_PROVIDER_SITE_OTHER): Payer: Self-pay | Admitting: Family Medicine

## 2021-09-29 VITALS — BP 136/87 | HR 60 | Temp 97.9°F | Ht 66.0 in | Wt 273.0 lb

## 2021-09-29 DIAGNOSIS — Z6841 Body Mass Index (BMI) 40.0 and over, adult: Secondary | ICD-10-CM | POA: Diagnosis not present

## 2021-09-29 DIAGNOSIS — Z9189 Other specified personal risk factors, not elsewhere classified: Secondary | ICD-10-CM | POA: Diagnosis not present

## 2021-09-29 DIAGNOSIS — E559 Vitamin D deficiency, unspecified: Secondary | ICD-10-CM | POA: Diagnosis not present

## 2021-09-29 MED ORDER — VITAMIN D (ERGOCALCIFEROL) 1.25 MG (50000 UNIT) PO CAPS: ORAL_CAPSULE | ORAL | 0 refills | Status: DC

## 2021-09-29 NOTE — Progress Notes (Signed)
Chief Complaint:   OBESITY Susan Dorsey is here to discuss her progress with her obesity treatment plan along with follow-up of her obesity related diagnoses. Susan Dorsey is on keeping a food journal and adhering to recommended goals of 1400 calories and 90 grams protein and states she is following her eating plan approximately 40% of the time. Susan Dorsey states she is going to the gym 60 minutes 3 times per week.  Today's visit was #: 22 Starting weight: 273 lbs Starting date: 07/30/2020 Today's weight: 273 lbs Today's date: 09/29/2021 Total lbs lost to date: 0 Total lbs lost since last in-office visit: +7  Interim History: Susan Dorsey was diagnosed with COVID September 7th and was sick for 5 days, then returned to work. She went away the past 2-3 weekends as well and ate off plan. She fell off her routine as well. Pt is not getting protein in.  Subjective:   1. Vitamin D deficiency She is currently taking prescription vitamin D 50,000 IU each week. She denies nausea, vomiting or muscle weakness.  Lab Results  Component Value Date   VD25OH 60.3 07/20/2021   VD25OH 45.8 07/30/2020   VD25OH 45.5 09/13/2018   2. At risk for constipation Susan Dorsey is at increased risk for constipation due to increasing her protein content.  Assessment/Plan:  No orders of the defined types were placed in this encounter.   Medications Discontinued During This Encounter  Medication Reason   Vitamin D, Ergocalciferol, (DRISDOL) 1.25 MG (50000 UNIT) CAPS capsule Reorder     Meds ordered this encounter  Medications   Vitamin D, Ergocalciferol, (DRISDOL) 1.25 MG (50000 UNIT) CAPS capsule    Sig: Take one tablet twice monthly    Dispense:  2 capsule    Refill:  0    30 d supply;  ** OV for RF **   Do not send RF request     1. Vitamin D deficiency Low Vitamin D level contributes to fatigue and are associated with obesity, breast, and colon cancer. She agrees to continue to take prescription Vitamin D 50,000 IU every week and  will follow-up for routine testing of Vitamin D, at least 2-3 times per year to avoid over-replacement.  Refill- Vitamin D, Ergocalciferol, (DRISDOL) 1.25 MG (50000 UNIT) CAPS capsule; Take one tablet twice monthly  Dispense: 2 capsule; Refill: 0  2. At risk for constipation Susan Dorsey was given approximately 9 minutes of counseling today regarding prevention of constipation. She was encouraged to increase water and fiber intake.    3. Obesity with current BMI of 44.1  Susan Dorsey is currently in the action stage of change. As such, her goal is to continue with weight loss efforts. She has agreed to keeping a food journal and adhering to recommended goals of 1400 calories and 90 grams protein.   Pt will refocus her energy and start weighing, measuring, and journaling everything she eats. Recipe II packet provided to pt. Bring in a log of calories and proteins to next OV.  Exercise goals:  As is  Behavioral modification strategies: increasing lean protein intake, decreasing simple carbohydrates, and keeping a strict food journal.  Susan Dorsey has agreed to follow-up with our clinic in 3 weeks. She was informed of the importance of frequent follow-up visits to maximize her success with intensive lifestyle modifications for her multiple health conditions.   Objective:   Blood pressure 136/87, pulse 60, temperature 97.9 F (36.6 C), height 5\' 6"  (1.676 m), weight 273 lb (123.8 kg), SpO2 98 %. Body  mass index is 44.06 kg/m.  General: Cooperative, alert, well developed, in no acute distress. HEENT: Conjunctivae and lids unremarkable. Cardiovascular: Regular rhythm.  Lungs: Normal work of breathing. Neurologic: No focal deficits.   Lab Results  Component Value Date   CREATININE 0.85 07/30/2020   BUN 12 07/30/2020   NA 135 07/30/2020   K 4.0 07/30/2020   CL 97 07/30/2020   CO2 25 07/30/2020   Lab Results  Component Value Date   ALT 19 07/30/2020   AST 18 07/30/2020   ALKPHOS 99 07/30/2020   BILITOT  0.5 07/30/2020   Lab Results  Component Value Date   HGBA1C 5.3 07/20/2021   HGBA1C 5.2 07/30/2020   HGBA1C 5.1 09/13/2018   HGBA1C 5.2 01/08/2018   HGBA1C 5.1 07/14/2017   Lab Results  Component Value Date   INSULIN 3.1 07/20/2021   INSULIN 3.3 07/30/2020   INSULIN 5.3 09/13/2018   INSULIN 5.6 01/08/2018   INSULIN 6.0 07/14/2017   Lab Results  Component Value Date   TSH 3.210 02/15/2021   Lab Results  Component Value Date   CHOL 199 07/20/2021   HDL 56 07/20/2021   LDLCALC 124 (H) 07/20/2021   TRIG 108 07/20/2021   Lab Results  Component Value Date   VD25OH 60.3 07/20/2021   VD25OH 45.8 07/30/2020   VD25OH 45.5 09/13/2018   Lab Results  Component Value Date   WBC 7.5 07/30/2020   HGB 14.9 07/30/2020   HCT 44.1 07/30/2020   MCV 96 07/30/2020   PLT 245 07/30/2020   Lab Results  Component Value Date   IRON 76 07/30/2020   TIBC 294 07/30/2020   FERRITIN 91 07/30/2020   Attestation Statements:   Reviewed by clinician on day of visit: allergies, medications, problem list, medical history, surgical history, family history, social history, and previous encounter notes.  Edmund Hilda, CMA, am acting as transcriptionist for [employer], DO.  I have reviewed the above documentation for accuracy and completeness, and I agree with the above. Carlye Grippe, D.O.  The 21st Century Cures Act was signed into law in 2016 which includes the topic of electronic health records.  This provides immediate access to information in MyChart.  This includes consultation notes, operative notes, office notes, lab results and pathology reports.  If you have any questions about what you read please let us know at your next visit so we can discuss your concerns and take corrective action if need be.  We are right here with you.

## 2021-10-19 ENCOUNTER — Ambulatory Visit (INDEPENDENT_AMBULATORY_CARE_PROVIDER_SITE_OTHER): Payer: 59 | Admitting: Family Medicine

## 2021-11-01 ENCOUNTER — Encounter (INDEPENDENT_AMBULATORY_CARE_PROVIDER_SITE_OTHER): Payer: Self-pay | Admitting: Family Medicine

## 2021-11-01 ENCOUNTER — Other Ambulatory Visit (HOSPITAL_COMMUNITY): Payer: Self-pay

## 2021-11-01 ENCOUNTER — Other Ambulatory Visit: Payer: Self-pay

## 2021-11-01 ENCOUNTER — Ambulatory Visit (INDEPENDENT_AMBULATORY_CARE_PROVIDER_SITE_OTHER): Payer: 59 | Admitting: Family Medicine

## 2021-11-01 VITALS — BP 154/9 | HR 86 | Temp 97.8°F | Ht 66.0 in | Wt 272.0 lb

## 2021-11-01 DIAGNOSIS — Z9189 Other specified personal risk factors, not elsewhere classified: Secondary | ICD-10-CM

## 2021-11-01 DIAGNOSIS — Z6841 Body Mass Index (BMI) 40.0 and over, adult: Secondary | ICD-10-CM

## 2021-11-01 DIAGNOSIS — E559 Vitamin D deficiency, unspecified: Secondary | ICD-10-CM | POA: Diagnosis not present

## 2021-11-01 DIAGNOSIS — E8881 Metabolic syndrome: Secondary | ICD-10-CM | POA: Diagnosis not present

## 2021-11-01 MED ORDER — VITAMIN D (ERGOCALCIFEROL) 1.25 MG (50000 UNIT) PO CAPS: ORAL_CAPSULE | ORAL | 0 refills | Status: DC

## 2021-11-01 MED ORDER — TIRZEPATIDE 2.5 MG/0.5ML ~~LOC~~ SOAJ
2.5000 mg | SUBCUTANEOUS | 0 refills | Status: DC
  Filled 2021-11-01 – 2021-11-05 (×2): qty 2, 28d supply, fill #0

## 2021-11-01 NOTE — Progress Notes (Signed)
Chief Complaint:   OBESITY Susan Dorsey is here to discuss her progress with her obesity treatment plan along with follow-up of her obesity related diagnoses. [name] is on keeping a food journal and adhering to recommended goals of 1400 calories and 90 grams protein and states she is following her eating plan approximately 50% of the time. [name] states she is going to the gym 60 minutes 3 times per week.  Today's visit was #: 23 Starting weight: 273 lbs Starting date: 07/30/2020 Today's weight: 272 lbs Today's date: 11/01/2021 Total lbs lost to date: 1 Total lbs lost since last in-office visit: 1  Interim History: After her last OV, Susan Dorsey went to meet with Dr. Daphine Deutscher of [facility] and was told she needs to be on Vegetarian diet, but did not provide one. Pt has been doing only meat on weekends.  Subjective:   1. Insulin resistance with polyphagia Susan Dorsey has a diagnosis of insulin resistance based on her elevated fasting insulin level >5. She continues to work on diet and exercise to decrease her risk of diabetes.  Lab Results  Component Value Date   INSULIN 3.1 07/20/2021   INSULIN 3.3 07/30/2020   INSULIN 5.3 09/13/2018   INSULIN 5.6 01/08/2018   INSULIN 6.0 07/14/2017   Lab Results  Component Value Date   HGBA1C 5.3 07/20/2021   2. Vitamin D deficiency She is currently taking prescription vitamin D 50,000 IU each week. She denies nausea, vomiting or muscle weakness.  Lab Results  Component Value Date   VD25OH 60.3 07/20/2021   VD25OH 45.8 07/30/2020   VD25OH 45.5 09/13/2018   3. At risk for side effect of medication Rhealyn is at risk for side effects of medication due to starting a new medication.  Assessment/Plan:  No orders of the defined types were placed in this encounter.   Medications Discontinued During This Encounter  Medication Reason   Vitamin D, Ergocalciferol, (DRISDOL) 1.25 MG (50000 UNIT) CAPS capsule Reorder     Meds ordered this encounter  Medications    Vitamin D, Ergocalciferol, (DRISDOL) 1.25 MG (50000 UNIT) CAPS capsule    Sig: Take one tablet twice monthly    Dispense:  2 capsule    Refill:  0    30 d supply;  ** OV for RF **   Do not send RF request   tirzepatide (MOUNJARO) 2.5 MG/0.5ML Pen    Sig: Inject 2.5 mg into the skin once a week.  * must have an office visit for refills *    Dispense:  2 mL    Refill:  0    30 d supply;  ** OV for RF **   Do not send RF request     1. Insulin resistance with polyphagia [name] will start Mounjaro and continue to work on weight loss, exercise, and decreasing simple carbohydrates to help decrease the risk of diabetes. [name] agreed to follow-up with [place] as directed to closely monitor her progress.  Start- tirzepatide [facility]) 2.5 MG/0.5ML Pen; Inject 2.5 mg into the skin once a week. (must have an office visit for refills) Dispense: 2 mL; Refill: 0  2. Vitamin D deficiency Low Vitamin D level contributes to fatigue and are associated with obesity, breast, and colon cancer. She agrees to continue to take prescription Vitamin D 50,000 IU twice a month and will follow-up for routine testing of Vitamin D, at least 2-3 times per year to avoid over-replacement.  Refill- Vitamin D, Ergocalciferol, (DRISDOL) 1.25 MG (50000  UNIT) CAPS capsule; Take one tablet twice monthly  Dispense: 2 capsule; Refill: 0  3. At risk for side effect of medication [name] was given approximately 9 minutes of drug side effect counseling today.  We discussed side effect possibility and risk versus benefits. [name] agreed to the medication and will contact this office if these side effects are intolerable.  Repetitive spaced learning was employed today to elicit superior memory formation and behavioral change.   4. Obesity with current BMI of 44.0  Susan Dorsey is currently in the action stage of change. As such, her goal is to continue with weight loss efforts. She has agreed to change to keeping a food journal and adhering to recommended  goals of 1300-1400 calories and 80 grams protein and the Vegetarian Plan.   Exercise goals:  As is  Behavioral modification strategies: no skipping meals, meal planning and cooking strategies, and planning for success.  [name] has agreed to follow-up with our clinic in 2-3 weeks- come fasting 30 minutes prior to appt for IC. She was informed of the importance of frequent follow-up visits to maximize her success with intensive lifestyle modifications for her multiple health conditions.   Objective:   Blood pressure (!) 154/9, pulse 86, temperature 97.8 F (36.6 C), height 5\' 6"  (1.676 m), weight 272 lb (123.4 kg), SpO2 97 %. Body mass index is 43.9 kg/m.  General: Cooperative, alert, well developed, in no acute distress. HEENT: Conjunctivae and lids unremarkable. Cardiovascular: Regular rhythm.  Lungs: Normal work of breathing. Neurologic: No focal deficits.   Lab Results  Component Value Date   CREATININE 0.85 07/30/2020   BUN 12 07/30/2020   NA 135 07/30/2020   K 4.0 07/30/2020   CL 97 07/30/2020   CO2 25 07/30/2020   Lab Results  Component Value Date   ALT 19 07/30/2020   AST 18 07/30/2020   ALKPHOS 99 07/30/2020   BILITOT 0.5 07/30/2020   Lab Results  Component Value Date   HGBA1C 5.3 07/20/2021   HGBA1C 5.2 07/30/2020   HGBA1C 5.1 09/13/2018   HGBA1C 5.2 01/08/2018   HGBA1C 5.1 07/14/2017   Lab Results  Component Value Date   INSULIN 3.1 07/20/2021   INSULIN 3.3 07/30/2020   INSULIN 5.3 09/13/2018   INSULIN 5.6 01/08/2018   INSULIN 6.0 07/14/2017   Lab Results  Component Value Date   TSH 3.210 02/15/2021   Lab Results  Component Value Date   CHOL 199 07/20/2021   HDL 56 07/20/2021   LDLCALC 124 (H) 07/20/2021   TRIG 108 07/20/2021   Lab Results  Component Value Date   VD25OH 60.3 07/20/2021   VD25OH 45.8 07/30/2020   VD25OH 45.5 09/13/2018   Lab Results  Component Value Date   WBC 7.5 07/30/2020   HGB 14.9 07/30/2020   HCT 44.1 07/30/2020    MCV 96 07/30/2020   PLT 245 07/30/2020   Lab Results  Component Value Date   IRON 76 07/30/2020   TIBC 294 07/30/2020   FERRITIN 91 07/30/2020    Attestation Statements:   Reviewed by clinician on day of visit: allergies, medications, problem list, medical history, surgical history, family history, social history, and previous encounter notes.  09/29/2020, CMA, am acting as transcriptionist for Edmund Hilda, DO.  I have reviewed the above documentation for accuracy and completeness, and I agree with the above. [employer], D.O.  The 21st Century Cures Act was signed into law in 2016 which includes the topic of  electronic health records.  This provides immediate access to information in MyChart.  This includes consultation notes, operative notes, office notes, lab results and pathology reports.  If you have any questions about what you read please let us know at your next visit so we can discuss your concerns and take corrective action if need be.  We are right here with you.

## 2021-11-02 ENCOUNTER — Encounter (INDEPENDENT_AMBULATORY_CARE_PROVIDER_SITE_OTHER): Payer: Self-pay

## 2021-11-05 ENCOUNTER — Other Ambulatory Visit (HOSPITAL_COMMUNITY): Payer: Self-pay

## 2021-11-16 ENCOUNTER — Other Ambulatory Visit (HOSPITAL_COMMUNITY): Payer: Self-pay

## 2021-11-16 ENCOUNTER — Encounter (INDEPENDENT_AMBULATORY_CARE_PROVIDER_SITE_OTHER): Payer: Self-pay | Admitting: Family Medicine

## 2021-11-16 ENCOUNTER — Other Ambulatory Visit: Payer: Self-pay

## 2021-11-16 ENCOUNTER — Ambulatory Visit (INDEPENDENT_AMBULATORY_CARE_PROVIDER_SITE_OTHER): Payer: 59 | Admitting: Family Medicine

## 2021-11-16 VITALS — BP 150/88 | HR 61 | Temp 97.9°F | Ht 66.0 in | Wt 272.0 lb

## 2021-11-16 DIAGNOSIS — R0602 Shortness of breath: Secondary | ICD-10-CM | POA: Diagnosis not present

## 2021-11-16 DIAGNOSIS — E8881 Metabolic syndrome: Secondary | ICD-10-CM | POA: Diagnosis not present

## 2021-11-16 DIAGNOSIS — Z9189 Other specified personal risk factors, not elsewhere classified: Secondary | ICD-10-CM

## 2021-11-16 DIAGNOSIS — E559 Vitamin D deficiency, unspecified: Secondary | ICD-10-CM

## 2021-11-16 DIAGNOSIS — Z6841 Body Mass Index (BMI) 40.0 and over, adult: Secondary | ICD-10-CM

## 2021-11-16 MED ORDER — VITAMIN D (ERGOCALCIFEROL) 1.25 MG (50000 UNIT) PO CAPS: ORAL_CAPSULE | ORAL | 0 refills | Status: DC

## 2021-11-16 MED ORDER — TIRZEPATIDE 2.5 MG/0.5ML ~~LOC~~ SOAJ
2.5000 mg | SUBCUTANEOUS | 0 refills | Status: DC
Start: 2021-11-16 — End: 2021-12-13
  Filled 2021-11-16 – 2021-12-06 (×2): qty 2, 28d supply, fill #0

## 2021-11-16 NOTE — Progress Notes (Signed)
Chief Complaint:   OBESITY Susan Dorsey is here to discuss her progress with her obesity treatment plan along with follow-up of her obesity related diagnoses. Susan Dorsey is on keeping a food journal and adhering to recommended goals of 1300-1400 calories and 80 grams of protein daily or the Vegetarian Plan and states she is following her eating plan approximately 60% of the time. Susan Dorsey states she is at the gym at [facility] for 60 minutes 3 times per week.  Today's visit was #: 24 Starting weight: 273 lbs Starting date: 07/30/2020 Today's weight: 272 lbs Today's date: 11/16/2021 Total lbs lost to date: 1 Total lbs lost since last in-office visit: 0  Interim History: Susan Dorsey celebrated Thanksgiving day this past weekend ans she did not gain any weight. She is happy with her accomplishments.  Subjective:   1. Shortness of breath on exertion Susan Dorsey notes increasing shortness of breath with exercising and seems to be worsening over time with weight gain. She notes getting out of breath sooner with activity than she used to. Susan Dorsey denies shortness of breath at rest or orthopnea.  2. Insulin resistance with polyphagia Susan Dorsey started [facility] and has been on it for 2 weeks.  3. Vitamin D deficiency Susan Dorsey is currently taking prescription vitamin D 50,000 IU every 14 days. She denies nausea, vomiting or muscle weakness.  4. At risk for constipation Susan Dorsey is at increased risk for constipation due to [facility] and the need to increase her water intake.  Assessment/Plan:  No orders of the defined types were placed in this encounter.   Medications Discontinued During This Encounter  Medication Reason   Vitamin D, Ergocalciferol, (DRISDOL) 1.25 MG (50000 UNIT) CAPS capsule Reorder   tirzepatide (MOUNJARO) 2.5 MG/0.5ML Pen Reorder        1. Shortness of breath on exertion IC was done today, and it has increased by 97 calories since her last check. No need for change in her meal plan. Will continue to monitor closely.  2.  Insulin resistance with polyphagia We will refill Mounjaro at the same dose for 1 month. Susan Dorsey will continue to work on weight loss, exercise, and decreasing simple carbohydrates to help decrease the risk of diabetes. Susan Dorsey agreed to follow-up with [place] as directed to closely monitor her progress.  - tirzepatide [facility]) 2.5 MG/0.5ML Pen; Inject 2.5 mg into the skin once a week. Dispense: 2 mL; Refill: 0  3. Vitamin D deficiency Low Vitamin D level contributes to fatigue and are associated with obesity, breast, and colon cancer. We will refill prescription Vitamin D for 1 month. Susan Dorsey will follow-up for routine testing of Vitamin D, at least 2-3 times per year to avoid over-replacement.  - Vitamin D, Ergocalciferol, (DRISDOL) 1.25 MG (50000 UNIT) CAPS capsule; Take one tablet twice monthly  Dispense: 2 capsule; Refill: 0  4. At risk for constipation Susan Dorsey was given approximately 9 minutes of counseling today regarding prevention of constipation. She was encouraged to increase water and fiber intake.   5. Obesity with current BMI of 44 Susan Dorsey is currently in the action stage of change. As such, her goal is to continue with weight loss efforts. She has agreed to keeping a food journal and adhering to recommended goals of 1300-1400 calories and 80 grams of protein daily.   I recommended her to bring in her log of calories and protein. Susan Dorsey's own goal is to not gain over Thanksgiving day and Christmas.  Exercise goals: As is.  Behavioral modification strategies: holiday eating strategies ,  planning for success, and keeping a strict food journal.  Susan Dorsey has agreed to follow-up with our clinic in 3 weeks. She was informed of the importance of frequent follow-up visits to maximize her success with intensive lifestyle modifications for her multiple health conditions.   Objective:   Blood pressure (!) 150/88, pulse 61, temperature 97.9 F (36.6 C), height 5\' 6"  (1.676 m), weight 272 lb (123.4 kg), SpO2 100  %. Body mass index is 43.9 kg/m.  General: Cooperative, alert, well developed, in no acute distress. HEENT: Conjunctivae and lids unremarkable. Cardiovascular: Regular rhythm.  Lungs: Normal work of breathing. Neurologic: No focal deficits.   Lab Results  Component Value Date   CREATININE 0.85 07/30/2020   BUN 12 07/30/2020   NA 135 07/30/2020   K 4.0 07/30/2020   CL 97 07/30/2020   CO2 25 07/30/2020   Lab Results  Component Value Date   ALT 19 07/30/2020   AST 18 07/30/2020   ALKPHOS 99 07/30/2020   BILITOT 0.5 07/30/2020   Lab Results  Component Value Date   HGBA1C 5.3 07/20/2021   HGBA1C 5.2 07/30/2020   HGBA1C 5.1 09/13/2018   HGBA1C 5.2 01/08/2018   HGBA1C 5.1 07/14/2017   Lab Results  Component Value Date   INSULIN 3.1 07/20/2021   INSULIN 3.3 07/30/2020   INSULIN 5.3 09/13/2018   INSULIN 5.6 01/08/2018   INSULIN 6.0 07/14/2017   Lab Results  Component Value Date   TSH 3.210 02/15/2021   Lab Results  Component Value Date   CHOL 199 07/20/2021   HDL 56 07/20/2021   LDLCALC 124 (H) 07/20/2021   TRIG 108 07/20/2021   Lab Results  Component Value Date   VD25OH 60.3 07/20/2021   VD25OH 45.8 07/30/2020   VD25OH 45.5 09/13/2018   Lab Results  Component Value Date   WBC 7.5 07/30/2020   HGB 14.9 07/30/2020   HCT 44.1 07/30/2020   MCV 96 07/30/2020   PLT 245 07/30/2020   Lab Results  Component Value Date   IRON 76 07/30/2020   TIBC 294 07/30/2020   FERRITIN 91 07/30/2020   Attestation Statements:   Reviewed by clinician on day of visit: allergies, medications, problem list, medical history, surgical history, family history, social history, and previous encounter notes.   09/29/2020, am acting as transcriptionist for Trude Mcburney, DO.  I have reviewed the above documentation for accuracy and completeness, and I agree with the above. [employer], D.O.  The 21st Century Cures Act was signed into law in 2016 which includes  the topic of electronic health records.  This provides immediate access to information in MyChart.  This includes consultation notes, operative notes, office notes, lab results and pathology reports.  If you have any questions about what you read please let 2017 know at your next visit so we can discuss your concerns and take corrective action if need be.  We are right here with you.

## 2021-12-06 ENCOUNTER — Other Ambulatory Visit: Payer: Self-pay

## 2021-12-06 ENCOUNTER — Encounter (INDEPENDENT_AMBULATORY_CARE_PROVIDER_SITE_OTHER): Payer: Self-pay | Admitting: Family Medicine

## 2021-12-06 ENCOUNTER — Ambulatory Visit (INDEPENDENT_AMBULATORY_CARE_PROVIDER_SITE_OTHER): Payer: 59 | Admitting: Family Medicine

## 2021-12-06 ENCOUNTER — Other Ambulatory Visit (HOSPITAL_COMMUNITY): Payer: Self-pay

## 2021-12-06 VITALS — BP 158/90 | HR 58 | Temp 98.1°F | Ht 66.0 in | Wt 275.0 lb

## 2021-12-06 DIAGNOSIS — E559 Vitamin D deficiency, unspecified: Secondary | ICD-10-CM | POA: Diagnosis not present

## 2021-12-06 DIAGNOSIS — E8881 Metabolic syndrome: Secondary | ICD-10-CM | POA: Diagnosis not present

## 2021-12-06 DIAGNOSIS — Z9189 Other specified personal risk factors, not elsewhere classified: Secondary | ICD-10-CM | POA: Diagnosis not present

## 2021-12-06 DIAGNOSIS — R030 Elevated blood-pressure reading, without diagnosis of hypertension: Secondary | ICD-10-CM | POA: Diagnosis not present

## 2021-12-06 DIAGNOSIS — Z6841 Body Mass Index (BMI) 40.0 and over, adult: Secondary | ICD-10-CM

## 2021-12-06 MED ORDER — VITAMIN D (ERGOCALCIFEROL) 1.25 MG (50000 UNIT) PO CAPS: ORAL_CAPSULE | ORAL | 0 refills | Status: DC

## 2021-12-07 ENCOUNTER — Telehealth (INDEPENDENT_AMBULATORY_CARE_PROVIDER_SITE_OTHER): Payer: Self-pay | Admitting: Family Medicine

## 2021-12-07 ENCOUNTER — Other Ambulatory Visit (HOSPITAL_COMMUNITY): Payer: Self-pay

## 2021-12-07 NOTE — Telephone Encounter (Signed)
Patient need a prior authorization for [facility].

## 2021-12-07 NOTE — Progress Notes (Signed)
Chief Complaint:   OBESITY Susan Dorsey is here to discuss her progress with her obesity treatment plan along with follow-up of her obesity related diagnoses. Susan Dorsey is on keeping a food journal and adhering to recommended goals of 1300-1400 calories and 80 grams of protein daily and states she is following her eating plan approximately 65% of the time. Susan Dorsey states she is at the gym for 60 minutes 3 times per week.  Today's visit was #: 25 Starting weight: 273 lbs Starting date: 07/30/2020 Today's weight: 275 lbs Today's date: 12/06/2021 Total lbs lost to date: 0 Total lbs lost since last in-office visit: 0  Interim History: Susan Dorsey plays Mrs. Susan Dorsey for the holidays with her husband as Susan Dorsey. They has had several outings lately and she has skipped meals. She has no issues with the plan.  Subjective:   1. Insulin resistance with polyphagia Susan Dorsey likes the effects on her hunger and decreased emotional desire to eat from the [facility], but with her dumping syndrome symptoms they have been exacerbated some.   2. Vitamin D deficiency Susan Dorsey is currently taking prescription vitamin D 50,000 IU every 14 days. She denies nausea, vomiting or muscle weakness.  3. White coat syndrome without diagnosis of hypertension Susan Dorsey's blood pressure is elevated again today, but is well controlled at home and within normal limits.   BP Readings from Last 3 Encounters:  12/06/21 (!) 158/90  11/16/21 (!) 150/88  11/01/21 (!) 154/9   4. At risk for activity intolerance Susan Dorsey is at risk for exercise intolerance.  Assessment/Plan:  No orders of the defined types were placed in this encounter.   Medications Discontinued During This Encounter  Medication Reason   Vitamin D, Ergocalciferol, (DRISDOL) 1.25 MG (50000 UNIT) CAPS capsule Reorder     Meds ordered this encounter  Medications   Vitamin D, Ergocalciferol, (DRISDOL) 1.25 MG (50000 UNIT) CAPS capsule    Sig: Take one tablet twice monthly    Dispense:  2 capsule     Refill:  0    30 d supply;  ** OV for RF **   Do not send RF request     1. Insulin resistance with polyphagia Susan Dorsey will continue Mounjaro at the same low dose until the new year. She may or may not take a break from the medication over the holidays due to rich foods that she will be eating. Susan Dorsey agreed to follow-up with [place] as directed to closely monitor her progress.  2. Vitamin D deficiency We will refill prescription Vitamin D for 1 month. Susan Dorsey will follow-up for routine testing of Vitamin D, at least 2-3 times per year to avoid over-replacement.  - Vitamin D, Ergocalciferol, (DRISDOL) 1.25 MG (50000 UNIT) CAPS capsule; Take one tablet twice monthly  Dispense: 2 capsule; Refill: 0  3. White coat syndrome without diagnosis of hypertension No change in therapy today. Susan Dorsey is to decrease salt in her diet, continue her prudent nutritional plan, and weight loss. She is to continue her home blood pressure monitoring.  4. At risk for activity intolerance Susan Dorsey was given approximately 8 minutes of exercise intolerance counseling today. She is 60 y.o. female and has risk factors exercise intolerance including obesity. We discussed intensive lifestyle modifications today with an emphasis on specific weight loss instructions and strategies. Susan Dorsey will slowly increase activity as tolerated.  Repetitive spaced learning was employed today to elicit superior memory formation and behavioral change.  5. Obesity with current BMI of 44.4 Susan Dorsey is currently in the  action stage of change. As such, her goal is to continue with weight loss efforts. She has agreed to keeping a food journal and adhering to recommended goals of 1300-1400 calories and 80 grams of protein daily.   We will consider labs in the near future: Vit D, B12, A1c, fasting insulin, FLP, and CMP.  Exercise goals: As is, and increase as tolerated.  Behavioral modification strategies: holiday eating strategies .  Susan Dorsey has agreed to follow-up with  our clinic in 3 to 4 weeks. She was informed of the importance of frequent follow-up visits to maximize her success with intensive lifestyle modifications for her multiple health conditions.   Objective:   Blood pressure (!) 158/90, pulse (!) 58, temperature 98.1 F (36.7 C), height 5\' 6"  (1.676 m), weight 275 lb (124.7 kg), SpO2 99 %. Body mass index is 44.39 kg/m.  General: Cooperative, alert, well developed, in no acute distress. HEENT: Conjunctivae and lids unremarkable. Cardiovascular: Regular rhythm.  Lungs: Normal work of breathing. Neurologic: No focal deficits.   Lab Results  Component Value Date   CREATININE 0.85 07/30/2020   BUN 12 07/30/2020   NA 135 07/30/2020   K 4.0 07/30/2020   CL 97 07/30/2020   CO2 25 07/30/2020   Lab Results  Component Value Date   ALT 19 07/30/2020   AST 18 07/30/2020   ALKPHOS 99 07/30/2020   BILITOT 0.5 07/30/2020   Lab Results  Component Value Date   HGBA1C 5.3 07/20/2021   HGBA1C 5.2 07/30/2020   HGBA1C 5.1 09/13/2018   HGBA1C 5.2 01/08/2018   HGBA1C 5.1 07/14/2017   Lab Results  Component Value Date   INSULIN 3.1 07/20/2021   INSULIN 3.3 07/30/2020   INSULIN 5.3 09/13/2018   INSULIN 5.6 01/08/2018   INSULIN 6.0 07/14/2017   Lab Results  Component Value Date   TSH 3.210 02/15/2021   Lab Results  Component Value Date   CHOL 199 07/20/2021   HDL 56 07/20/2021   LDLCALC 124 (H) 07/20/2021   TRIG 108 07/20/2021   Lab Results  Component Value Date   VD25OH 60.3 07/20/2021   VD25OH 45.8 07/30/2020   VD25OH 45.5 09/13/2018   Lab Results  Component Value Date   WBC 7.5 07/30/2020   HGB 14.9 07/30/2020   HCT 44.1 07/30/2020   MCV 96 07/30/2020   PLT 245 07/30/2020   Lab Results  Component Value Date   IRON 76 07/30/2020   TIBC 294 07/30/2020   FERRITIN 91 07/30/2020   Attestation Statements:   Reviewed by clinician on day of visit: allergies, medications, problem list, medical history, surgical history,  family history, social history, and previous encounter notes.   09/29/2020, am acting as transcriptionist for Trude Mcburney, DO.  I have reviewed the above documentation for accuracy and completeness, and I agree with the above. [employer], D.O.  The 21st Century Cures Act was signed into law in 2016 which includes the topic of electronic health records.  This provides immediate access to information in MyChart.  This includes consultation notes, operative notes, office notes, lab results and pathology reports.  If you have any questions about what you read please let 2017 know at your next visit so we can discuss your concerns and take corrective action if need be.  We are right here with you.

## 2021-12-08 NOTE — Telephone Encounter (Signed)
Prior authorization has already been started for Mounjaro. Will notify patient and provider once response is received.  ?

## 2021-12-09 ENCOUNTER — Telehealth (INDEPENDENT_AMBULATORY_CARE_PROVIDER_SITE_OTHER): Payer: Self-pay | Admitting: Family Medicine

## 2021-12-09 ENCOUNTER — Encounter (INDEPENDENT_AMBULATORY_CARE_PROVIDER_SITE_OTHER): Payer: Self-pay

## 2021-12-09 NOTE — Telephone Encounter (Signed)
Prior authorization denied for Mounjaro. Patient already using coupon savings card. Patient sent mychart message.  °

## 2021-12-13 ENCOUNTER — Ambulatory Visit (HOSPITAL_COMMUNITY)
Admission: EM | Admit: 2021-12-13 | Discharge: 2021-12-13 | Disposition: A | Discharge status: Home / Self Care | Payer: 59 | Attending: Internal Medicine | Admitting: Internal Medicine

## 2021-12-13 ENCOUNTER — Other Ambulatory Visit: Payer: Self-pay

## 2021-12-13 ENCOUNTER — Encounter (HOSPITAL_COMMUNITY): Payer: Self-pay | Admitting: Emergency Medicine

## 2021-12-13 DIAGNOSIS — J3489 Other specified disorders of nose and nasal sinuses: Secondary | ICD-10-CM | POA: Diagnosis not present

## 2021-12-13 DIAGNOSIS — R0989 Other specified symptoms and signs involving the circulatory and respiratory systems: Secondary | ICD-10-CM

## 2021-12-13 MED ORDER — IPRATROPIUM BROMIDE 0.03 % NA SOLN: 2.0000 | Freq: Two times a day (BID) | NASAL | 12 refills | Status: DC

## 2021-12-13 NOTE — ED Triage Notes (Signed)
Pt presents with "something in sinus cavity" Xs 4 days. States causing irritation and sneezing.

## 2021-12-13 NOTE — Discharge Instructions (Addendum)
Stop flonase nasal spray Use saline nasal spray and ipratropium nasal spray If symptoms worsens or persist, please return to urgent care for re-evaluation

## 2021-12-14 NOTE — ED Provider Notes (Signed)
[facility]    CSN: [id number] Arrival date & time: 12/13/21  1808      History   Chief Complaint Chief Complaint  Patient presents with   Nasal Congestion    HPI [name] is a 60 y.o. female comes to the urgent care with 4-day history of sensation of something in her nasal cavity.  She denies any trauma to the nose.  This causes her to sneeze a lot.  She has some itchy nose.  She has tried using different approaches to remove it from her nasal passages.  She has had some nosebleeds recently.  She has a history of allergic rhinitis and currently uses Flonase and Singulair.  No fever or chills. HPI  Past Medical History:  Diagnosis Date   Allergy    Anemia    B12 deficiency    Food allergy    Gallbladder problem    GERD (gastroesophageal reflux disease)    Gluten intolerance    Gluten intolerance    Hyperlipidemia    borderline   Hypertension    Resolved after LAGB   IBS (irritable bowel syndrome)    Joint pain    Keratoconus    Morbid obesity (HCC)    Obesity    Peeling of nails    Pneumonia    Swallowing difficulty    Swelling    feet and legs   Swelling of both lower extremities    TMJ (dislocation of temporomandibular joint)    Vitamin D deficiency    Wears glasses    White coat syndrome with diagnosis of hypertension     Patient Active Problem List   Diagnosis Date Noted   Insulin resistance with polyphagia 11/16/2021   At risk for hypertension 04/29/2021   Other constipation 03/10/2021   h/o Elevated TSH 02/15/2021   Iron deficiency 02/15/2021   Polyphagia 02/15/2021   At risk for dehydration 01/26/2021   White coat syndrome without diagnosis of hypertension 01/12/2021   Depression 01/12/2021   At risk for depression 01/12/2021   Elevated blood pressure reading 12/29/2020   At risk for impaired metabolic function 12/29/2020   Arthropathy of left knee with pain 12/02/2020   At risk for activity intolerance 12/02/2020   Essential  hypertension diet controlled 11/18/2020   At risk for deficient intake of food 11/04/2020   S/P gastric sleeve procedure 10/07/2020   S/P laparoscopic sleeve gastrectomy 03/04/2019   Status post gastric surgery 01/01/2019   Other fatigue 07/14/2017   Dyspnea on exertion 07/14/2017   Vitamin D deficiency 07/14/2017   Vitamin B 12 deficiency 07/14/2017   Class 3 severe obesity with serious comorbidity and body mass index (BMI) of 40.0 to 44.9 in adult [facility]) 07/14/2017   Status post panniculectomy Sept 2013 11/02/2012   Lapband APS trucal vagotomy Jan 2008 05/30/2012   Panniculitis 08/19/2011    Past Surgical History:  Procedure Laterality Date   CHOLECYSTECTOMY  1990's   Patient unsure of date   CORNEAL TRANSPLANT  2021   LAPAROSCOPIC GASTRIC BAND REMOVAL WITH LAPAROSCOPIC GASTRIC SLEEVE RESECTION N/A 03/04/2019   Procedure: LAPAROSCOPIC GASTRIC BAND REMOVAL WITH LAPAROSCOPIC GASTRIC SLEEVE RESECTION, Upper Endo, ERAS Pathway;  Surgeon: Luretha Murphy, MD;  Location: [facility];  Service: General;  Laterality: N/A;   LAPAROSCOPIC GASTRIC BANDING  01/23/2007   PANNICULECTOMY  09/24/2012   Procedure: PANNICULECTOMY;  Surgeon: Valarie Merino, MD;  Location: [facility];  Service: General;  Laterality: N/A;   TONSILLECTOMY     1972  and adneoids    OB History     Gravida  0   Para  0   Term  0   Preterm  0   AB  0   Living  0      SAB  0   IAB  0   Ectopic  0   Multiple  0   Live Births  0            Home Medications    Prior to Admission medications   Medication Sig Start Date End Date Taking? Authorizing Provider  ipratropium (ATROVENT) 0.03 % nasal spray Place 2 sprays into both nostrils every 12 (twelve) hours. 12/13/21  Yes Tashiana Susan Dorsey, Britta Mccreedy, MD  Calcium Carbonate-Vit D-Min (CALTRATE 600+D PLUS PO) Take by mouth.    [provider]  Carboxymethylcellul-Glycerin (LUBRICATING EYE DROPS OP) Place 1 drop into both eyes daily.    [provider]   carboxymethylcellul-glycerin (REFRESH OPTIVE) 0.5-0.9 % ophthalmic solution Apply to eye.    [provider]  cetirizine (ZYRTEC) 10 MG tablet Take 10 mg by mouth daily.    [provider]  DHA-EPA-Vitamin E (OMEGA-3 COMPLEX PO) Take 2 capsules by mouth 2 (two) times daily.    [provider]  diphenhydrAMINE (BENADRYL) 25 MG tablet Take 25 mg by mouth at bedtime.     [provider]  FIBER ADULT GUMMIES 2 g CHEW Chew 2 tablets by mouth daily.     [provider]  fluticasone (FLONASE) 50 MCG/ACT nasal spray Place 2 sprays into the nose daily.  06/11/11   [provider]  Ibuprofen 200 MG CAPS Take by mouth.    [provider]  Magnesium 250 MG TABS Take 250 mg by mouth daily.     [provider]  Melatonin 5 MG CAPS Take 5 mg by mouth at bedtime.    [provider]  montelukast (SINGULAIR) 10 MG tablet Take 10 mg by mouth daily.     [provider]  Multiple Vitamin (MULTIVITAMIN WITH MINERALS) TABS Take 1 tablet by mouth daily.    [provider]  omeprazole (PRILOSEC) 20 MG capsule Take 20 mg by mouth daily.    [provider]  Plant Sterols and Stanols (CHOLEST OFF PO) Take 2 capsules by mouth daily.    [provider]  prednisoLONE acetate (PRED FORTE) 1 % ophthalmic suspension SMARTSIG:1 Drop(s) In Eye(s) Morning-Night 07/01/20   [provider]  Propylene Glycol (SYSTANE BALANCE) 0.6 % SOLN Place 1 drop into both eyes daily as needed (dry eyes).    [provider]  Vitamin D, Ergocalciferol, (DRISDOL) 1.25 MG (50000 UNIT) CAPS capsule Take one tablet twice monthly 12/06/21   Thomasene Lot, DO    Family History Family History  Problem Relation Age of Onset   Hypertension Mother    Arthritis Mother    Diabetes Mother    Hyperlipidemia Mother    Heart disease Mother    Depression Mother    Anxiety disorder Mother    Sleep apnea Mother    Eating  disorder Mother    Obstructive Sleep Apnea Mother    Obesity Mother    Cancer Father        lliver, pancreas, stomach   Liver disease Father    Chronic fatigue Sister     Social History Social History   Tobacco Use   Smoking status: Never   Smokeless tobacco: Never  Vaping Use   Vaping Use: Never  used  Substance Use Topics   Alcohol use: No    Comment: occasional wine   Drug use: No     Allergies   Aspirin, Nsaids, Chocolate, Gluten meal, and Other   Review of Systems Review of Systems  HENT:  Positive for sinus pain and sneezing. Negative for facial swelling, postnasal drip, rhinorrhea, sinus pressure, sore throat and voice change.     Physical Exam Triage Vital Signs ED Triage Vitals  Enc Vitals Group     BP 12/13/21 1936 (!) 153/83     Pulse Rate 12/13/21 1936 65     Resp 12/13/21 1936 17     Temp 12/13/21 1936 98.2 F (36.8 C)     Temp Source 12/13/21 1936 Oral     SpO2 12/13/21 1936 99 %     Weight --      Height --      Head Circumference --      Peak Flow --      Pain Score 12/13/21 1933 0     Pain Loc --      Pain Edu? --      Excl. in GC? --    No data found.  Updated Vital Signs BP (!) 153/83 (BP Location: Right Arm)    Pulse 65    Temp 98.2 F (36.8 C) (Oral)    Resp 17    LMP 02/15/2020    SpO2 99%   Visual Acuity Right Eye Distance:   Left Eye Distance:   Bilateral Distance:    Right Eye Near:   Left Eye Near:    Bilateral Near:     Physical Exam Vitals and nursing note reviewed.  Constitutional:      General: She is not in acute distress.    Appearance: Normal appearance. She is not ill-appearing.  HENT:     Head: Normocephalic and atraumatic.     Right Ear: Tympanic membrane normal.     Left Ear: Tympanic membrane normal.     Nose: Congestion present.     Comments: Shallow ulceration on the mucosa of the left nostril.  Nasal turbinates are boggy, swollen and erythematous.    Mouth/Throat:     Mouth: Mucous membranes are  moist.     Pharynx: No oropharyngeal exudate or posterior oropharyngeal erythema.  Cardiovascular:     Rate and Rhythm: Normal rate and regular rhythm.  Neurological:     Mental Status: She is alert.     UC Treatments / Results  Labs (all labs ordered are listed, but only abnormal results are displayed) Labs Reviewed - No data to display  EKG   Radiology No results found.  Procedures Procedures (including critical care time)  Medications Ordered in UC Medications - No data to display  Initial Impression / Assessment and Plan / UC Course  I have reviewed the triage vital signs and the nursing notes.  Pertinent labs & imaging results that were available during my care of the patient were reviewed by me and considered in my medical decision making (see chart for details).     1.  Erythematous lesion (superficial ulceration with attempts at healing) in left nasal mucosa: Ipratropium nasal spray Hold off fluticasone nasal spray at this time Saline nasal spray/drops Avoid manually dislodging the eschar on the lesion If symptoms persist please return to urgent care to be reevaluated. Final Clinical Impressions(s) / UC Diagnoses   Final diagnoses:  Erythematous nasal mucosa     Discharge Instructions  Stop flonase nasal spray Use saline nasal spray and ipratropium nasal spray If symptoms worsens or persist, please return to urgent care for re-evaluation   ED Prescriptions     Medication Sig Dispense Auth. Provider   ipratropium (ATROVENT) 0.03 % nasal spray Place 2 sprays into both nostrils every 12 (twelve) hours. 30 mL Kiran Lapine, Britta Mccreedy, MD      PDMP not reviewed this encounter.   Merrilee Jansky, MD 12/14/21 [phone]

## 2021-12-16 ENCOUNTER — Encounter (INDEPENDENT_AMBULATORY_CARE_PROVIDER_SITE_OTHER): Payer: Self-pay | Admitting: Family Medicine

## 2021-12-16 ENCOUNTER — Other Ambulatory Visit (HOSPITAL_COMMUNITY): Payer: Self-pay

## 2021-12-16 ENCOUNTER — Other Ambulatory Visit (INDEPENDENT_AMBULATORY_CARE_PROVIDER_SITE_OTHER): Payer: Self-pay

## 2021-12-16 DIAGNOSIS — E8881 Metabolic syndrome: Secondary | ICD-10-CM

## 2021-12-16 MED ORDER — TIRZEPATIDE 2.5 MG/0.5ML ~~LOC~~ SOAJ
2.5000 mg | SUBCUTANEOUS | 0 refills | Status: DC
  Filled 2021-12-16: qty 2, 28d supply, fill #0

## 2021-12-16 NOTE — Telephone Encounter (Signed)
Dr. O

## 2021-12-17 ENCOUNTER — Other Ambulatory Visit (HOSPITAL_COMMUNITY): Payer: Self-pay

## 2022-01-06 ENCOUNTER — Encounter (INDEPENDENT_AMBULATORY_CARE_PROVIDER_SITE_OTHER): Payer: Self-pay | Admitting: Family Medicine

## 2022-01-06 ENCOUNTER — Ambulatory Visit (INDEPENDENT_AMBULATORY_CARE_PROVIDER_SITE_OTHER): Payer: 59 | Admitting: Family Medicine

## 2022-01-06 ENCOUNTER — Other Ambulatory Visit (HOSPITAL_COMMUNITY): Payer: Self-pay

## 2022-01-06 ENCOUNTER — Other Ambulatory Visit: Payer: Self-pay

## 2022-01-06 VITALS — BP 152/86 | HR 61 | Temp 97.6°F | Ht 66.0 in | Wt 280.0 lb

## 2022-01-06 DIAGNOSIS — E559 Vitamin D deficiency, unspecified: Secondary | ICD-10-CM | POA: Diagnosis not present

## 2022-01-06 DIAGNOSIS — Z9189 Other specified personal risk factors, not elsewhere classified: Secondary | ICD-10-CM

## 2022-01-06 DIAGNOSIS — I10 Essential (primary) hypertension: Secondary | ICD-10-CM | POA: Diagnosis not present

## 2022-01-06 DIAGNOSIS — Z6841 Body Mass Index (BMI) 40.0 and over, adult: Secondary | ICD-10-CM

## 2022-01-06 DIAGNOSIS — E8881 Metabolic syndrome: Secondary | ICD-10-CM

## 2022-01-06 MED ORDER — TIRZEPATIDE 2.5 MG/0.5ML ~~LOC~~ SOAJ
2.5000 mg | SUBCUTANEOUS | 0 refills | Status: DC
  Filled 2022-01-06: qty 2, 28d supply, fill #0

## 2022-01-06 MED ORDER — VITAMIN D (ERGOCALCIFEROL) 1.25 MG (50000 UNIT) PO CAPS: ORAL_CAPSULE | ORAL | 0 refills | Status: DC

## 2022-01-07 ENCOUNTER — Other Ambulatory Visit (HOSPITAL_COMMUNITY): Payer: Self-pay

## 2022-01-10 ENCOUNTER — Other Ambulatory Visit (INDEPENDENT_AMBULATORY_CARE_PROVIDER_SITE_OTHER): Payer: Self-pay | Admitting: Family Medicine

## 2022-01-10 DIAGNOSIS — E88819 Insulin resistance, unspecified: Secondary | ICD-10-CM

## 2022-01-10 DIAGNOSIS — E8881 Metabolic syndrome: Secondary | ICD-10-CM

## 2022-01-10 NOTE — Progress Notes (Signed)
Erroneous entry

## 2022-01-10 NOTE — Progress Notes (Signed)
Chief Complaint:   OBESITY Susan Dorsey is here to discuss her progress with her obesity treatment plan along with follow-up of her obesity related diagnoses. Susan Dorsey is on keeping a food journal and adhering to recommended goals of 1300-1400 calories and 80 grams protein and states she is following her eating plan approximately ?% of the time. Susan Dorsey states she is going to the gym ? minutes 2 times per week.  Today's visit was #: 26 Starting weight: 273 lbs Starting date: 07/30/2020 Today's weight: 280 lbs Today's date: 01/06/2022 Total lbs lost to date: 0 Total lbs lost since last in-office visit: +5  Interim History: Susan Dorsey had several friends and her dog pass away recently. It has been an emotionally difficult time lately. Pt has done more emotional eating lately. She is not surprised she gained.  Subjective:   1. Insulin resistance with polyphagia Pt restarted Mounjaro on January 11th and got back on meal plan this past Sunday.  2. White coat syndrome with diagnosis of hypertension Pt is checking BP at home= 110's/80-85, consistently. But 1 time she jumped on the scale and checked BP afterwards and it was 148/89.  3. Vitamin D deficiency She is currently taking prescription vitamin D 50,000 IU twice a month She denies nausea, vomiting or muscle weakness.  4. At risk for depression Susan Dorsey is at risk for depression due to emotional stress lately.  Assessment/Plan:  No orders of the defined types were placed in this encounter.   Medications Discontinued During This Encounter  Medication Reason   Vitamin D, Ergocalciferol, (DRISDOL) 1.25 MG (50000 UNIT) CAPS capsule Reorder   tirzepatide Susan Dorsey(MOUNJARO) 2.5 MG/0.5ML Pen Reorder     Meds ordered this encounter  Medications   tirzepatide (MOUNJARO) 2.5 MG/0.5ML Pen    Sig: Inject 2.5 mg into the skin once a week.  *must have an office visit for refills*    Dispense:  2 mL    Refill:  0    30 d supply;  ** OV for RF **   Do not send RF request    Vitamin D, Ergocalciferol, (DRISDOL) 1.25 MG (50000 UNIT) CAPS capsule    Sig: Take one tablet twice monthly    Dispense:  2 capsule    Refill:  0    30 d supply;  ** OV for RF **   Do not send RF request     1. Insulin resistance with polyphagia Susan Dorsey will continue to work on weight loss, exercise, and decreasing simple carbohydrates to help decrease the risk of diabetes. Susan Dorsey agreed to follow-up with Susan Dorsey as directed to closely monitor her progress.  Refill- tirzepatide (MOUNJARO) 2.5 MG/0.5ML Pen; Inject 2.5 mg into the skin once a week. Dispense: 2 mL; Refill: 0  2. White coat syndrome with diagnosis of hypertension BP at goal at home when she checks it and other doctor's office visits.  No sx or concerns today. Will continue  to monitor closely.  3. Vitamin D deficiency Low Vitamin D level contributes to fatigue and are associated with obesity, breast, and colon cancer. She agrees to continue to take prescription Vitamin D 50,000 IU twice a month and will follow-up for routine testing of Vitamin D, at least 2-3 times per year to avoid over-replacement.  Refill- Vitamin D, Ergocalciferol, (DRISDOL) 1.25 MG (50000 UNIT) CAPS capsule; Take one tablet twice monthly  Dispense: 2 capsule; Refill: 0  4. At risk for depression Susan Dorsey was given approximately 9 minutes of depression risk counseling today.  She has risk factors for depression. We discussed the importance of a healthy work life balance, a healthy relationship with food and a good support system.  Repetitive spaced learning was employed today to elicit superior memory formation and behavioral change.  5. Obesity with current BMI of 45.2  Susan Dorsey is currently in the action stage of change. As such, her goal is to continue with weight loss efforts. She has agreed to keeping a food journal and adhering to recommended goals of 1300-1400 calories and 80 grams protein.   Exercise goals:  As is  Behavioral modification strategies: meal  planning and cooking strategies and planning for success.  Susan Dorsey has agreed to follow-up with our clinic in 3 weeks. She was informed of the importance of frequent follow-up visits to maximize her success with intensive lifestyle modifications for her multiple health conditions.   Objective:   Blood pressure (!) 152/86, pulse 61, temperature 97.6 F (36.4 C), height 5\' 6"  (1.676 m), weight 280 lb (127 kg), last menstrual period 02/15/2020, SpO2 98 %. Body mass index is 45.19 kg/m.  General: Cooperative, alert, well developed, in no acute distress. HEENT: Conjunctivae and lids unremarkable. Cardiovascular: Regular rhythm.  Lungs: Normal work of breathing. Neurologic: No focal deficits.   Lab Results  Component Value Date   CREATININE 0.85 07/30/2020   BUN 12 07/30/2020   NA 135 07/30/2020   K 4.0 07/30/2020   CL 97 07/30/2020   CO2 25 07/30/2020   Lab Results  Component Value Date   ALT 19 07/30/2020   AST 18 07/30/2020   ALKPHOS 99 07/30/2020   BILITOT 0.5 07/30/2020   Lab Results  Component Value Date   HGBA1C 5.3 07/20/2021   HGBA1C 5.2 07/30/2020   HGBA1C 5.1 09/13/2018   HGBA1C 5.2 01/08/2018   HGBA1C 5.1 07/14/2017   Lab Results  Component Value Date   INSULIN 3.1 07/20/2021   INSULIN 3.3 07/30/2020   INSULIN 5.3 09/13/2018   INSULIN 5.6 01/08/2018   INSULIN 6.0 07/14/2017   Lab Results  Component Value Date   TSH 3.210 02/15/2021   Lab Results  Component Value Date   CHOL 199 07/20/2021   HDL 56 07/20/2021   LDLCALC 124 (H) 07/20/2021   TRIG 108 07/20/2021   Lab Results  Component Value Date   VD25OH 60.3 07/20/2021   VD25OH 45.8 07/30/2020   VD25OH 45.5 09/13/2018   Lab Results  Component Value Date   WBC 7.5 07/30/2020   HGB 14.9 07/30/2020   HCT 44.1 07/30/2020   MCV 96 07/30/2020   PLT 245 07/30/2020   Lab Results  Component Value Date   IRON 76 07/30/2020   TIBC 294 07/30/2020   FERRITIN 91 07/30/2020   Attestation Statements:    Reviewed by clinician on day of visit: allergies, medications, problem list, medical history, surgical history, family history, social history, and previous encounter notes.  09/29/2020, CMA, am acting as transcriptionist for Edmund Hilda, DO.  I have reviewed the above documentation for accuracy and completeness, and I agree with the above. [employer], D.O.  The 21st Century Cures Act was signed into law in 2016 which includes the topic of electronic health records.  This provides immediate access to information in MyChart.  This includes consultation notes, operative notes, office notes, lab results and pathology reports.  If you have any questions about what you read please let 2017 know at your next visit so we can discuss your concerns and  take corrective action if need be.  We are right here with you.

## 2022-02-01 ENCOUNTER — Encounter (INDEPENDENT_AMBULATORY_CARE_PROVIDER_SITE_OTHER): Payer: Self-pay | Admitting: Family Medicine

## 2022-02-01 ENCOUNTER — Other Ambulatory Visit: Payer: Self-pay

## 2022-02-01 ENCOUNTER — Ambulatory Visit (INDEPENDENT_AMBULATORY_CARE_PROVIDER_SITE_OTHER): Payer: 59 | Admitting: Family Medicine

## 2022-02-01 ENCOUNTER — Other Ambulatory Visit (HOSPITAL_COMMUNITY): Payer: Self-pay

## 2022-02-01 VITALS — BP 123/81 | HR 63 | Temp 97.6°F | Ht 66.0 in | Wt 277.0 lb

## 2022-02-01 DIAGNOSIS — Z9189 Other specified personal risk factors, not elsewhere classified: Secondary | ICD-10-CM

## 2022-02-01 DIAGNOSIS — E8881 Metabolic syndrome: Secondary | ICD-10-CM

## 2022-02-01 DIAGNOSIS — Z6841 Body Mass Index (BMI) 40.0 and over, adult: Secondary | ICD-10-CM

## 2022-02-01 DIAGNOSIS — E669 Obesity, unspecified: Secondary | ICD-10-CM

## 2022-02-01 DIAGNOSIS — E559 Vitamin D deficiency, unspecified: Secondary | ICD-10-CM | POA: Diagnosis not present

## 2022-02-01 DIAGNOSIS — R030 Elevated blood-pressure reading, without diagnosis of hypertension: Secondary | ICD-10-CM

## 2022-02-01 DIAGNOSIS — E88819 Insulin resistance, unspecified: Secondary | ICD-10-CM

## 2022-02-01 DIAGNOSIS — E7849 Other hyperlipidemia: Secondary | ICD-10-CM | POA: Diagnosis not present

## 2022-02-01 MED ORDER — VITAMIN D (ERGOCALCIFEROL) 1.25 MG (50000 UNIT) PO CAPS: ORAL_CAPSULE | ORAL | 0 refills | Status: DC

## 2022-02-01 MED ORDER — TIRZEPATIDE 5 MG/0.5ML ~~LOC~~ SOAJ
5.0000 mg | SUBCUTANEOUS | 0 refills | Status: DC
  Filled 2022-02-01: qty 2, 28d supply, fill #0

## 2022-02-02 LAB — COMPREHENSIVE METABOLIC PANEL
ALT: 20 IU/L (ref 0–32)
AST: 22 IU/L (ref 0–40)
Albumin/Globulin Ratio: 1.8 (ref 1.2–2.2)
Albumin: 4.4 g/dL (ref 3.8–4.9)
Alkaline Phosphatase: 104 IU/L (ref 44–121)
BUN/Creatinine Ratio: 17 (ref 9–23)
BUN: 14 mg/dL (ref 6–24)
Bilirubin Total: 0.4 mg/dL (ref 0.0–1.2)
CO2: 26 mmol/L (ref 20–29)
Calcium: 9.8 mg/dL (ref 8.7–10.2)
Chloride: 103 mmol/L (ref 96–106)
Creatinine, Ser: 0.84 mg/dL (ref 0.57–1.00)
Globulin, Total: 2.4 g/dL (ref 1.5–4.5)
Glucose: 75 mg/dL (ref 70–99)
Potassium: 4.6 mmol/L (ref 3.5–5.2)
Sodium: 142 mmol/L (ref 134–144)
Total Protein: 6.8 g/dL (ref 6.0–8.5)
eGFR: 80 mL/min/{1.73_m2} (ref >59)

## 2022-02-02 LAB — LIPID PANEL
Chol/HDL Ratio: 3.4 ratio (ref 0.0–4.4)
Cholesterol, Total: 193 mg/dL (ref 100–199)
HDL: 56 mg/dL (ref >39)
LDL Chol Calc (NIH): 118 mg/dL — ABNORMAL HIGH (ref 0–99)
Triglycerides: 106 mg/dL (ref 0–149)
VLDL Cholesterol Cal: 19 mg/dL (ref 5–40)

## 2022-02-02 LAB — INSULIN, RANDOM: INSULIN: 5.2 u[IU]/mL (ref 2.6–24.9)

## 2022-02-02 LAB — VITAMIN D 25 HYDROXY (VIT D DEFICIENCY, FRACTURES): Vit D, 25-Hydroxy: 63.2 ng/mL (ref 30.0–100.0)

## 2022-02-02 LAB — HEMOGLOBIN A1C
Est. average glucose Bld gHb Est-mCnc: 108 mg/dL
Hgb A1c MFr Bld: 5.4 % (ref 4.8–5.6)

## 2022-02-02 NOTE — Progress Notes (Signed)
Chief Complaint:   OBESITY Susan Dorsey is here to discuss her progress with her obesity treatment plan along with follow-up of her obesity related diagnoses. Susan Dorsey is on keeping a food journal and adhering to recommended goals of 1300-1400 calories and 80 grams protein and states she is following her eating plan approximately 75% of the time. Susan Dorsey states she is going to gym class 60 minutes 3 times per week.  Today's visit was #: 27 Starting weight: 273 lbs Starting date: 07/30/2020 Today's weight: 277 lbs Today's date: 02/01/2022 Total lbs lost to date: 0 Total lbs lost since last in-office visit: 3  Interim History: Susan Dorsey is doing better and has less stress in life. Her job is going well. She is still going to the gym 3 days a week. Pt has no issues with plan or foods.  Subjective:   1. Insulin resistance with polyphagia Susan Dorsey is tolerating medication(s) well without side effects.  Medication compliance is good and patient appears to be taking it as prescribed.  The patient denies additional concerns regarding this condition. Medication: Mounjaro 2.5 mg  2. White coat syndrome without diagnosis of hypertension At home BP's are stable and within normal limits. Pt is only checking her BP seldomly. She feels great and has no concerns.  3. Other hyperlipidemia H/o elevated LDL. Pt has been following prudent nutritional plan and decreasing saturated and trans fats. Medication: OTC fish oil  4. Vitamin D deficiency Susan Dorsey is tolerating medication(s) well without side effects.  Medication compliance is good and patient appears to be taking it as prescribed.  The patient denies additional concerns regarding this condition. Medication: Ergocalciferol  5. At risk for diarrhea Susan Dorsey is at higher risk of diarrhea due to increasing dose of Mounjaro.  Assessment/Plan:   1. Insulin resistance with polyphagia Susan Dorsey will increase Mounjaro to 5.0 mg.  Plan: - I reiterated and again counseled patient on  pathophysiology of the disease process of I.R.  - Stressed importance of dietary and lifestyle modifications to result in weight loss as first line txmnt - in addition we discussed the risks and benefits of various medication options which can help [place] in the management of this disease process as well as with weight loss.  Will consider starting one of these meds in future, or a dose adjustment in the future, as we will focus on prudent nutritional plan at this time.  - continue to decrease simple carbs; increase fiber and proteins -> follow meal plan  - handouts provided at pt's request after education provided.  All concerns/questions addressed.   - anticipatory guidance given.   - We will recheck A1c and fasting insulin level in approximately 3 months from last check, or as deemed appropriate.   Refill- tirzepatide (MOUNJARO) 5 MG/0.5ML Pen; Inject 1 pen (5 mg) into the skin once a week.  Dispense: 2 mL; Refill: 0 - Hemoglobin A1c - Insulin, random  2. White coat syndrome without diagnosis of hypertension BP at goal today. Continue walking, prudent nutritional plan, and stress management strategies.  3. Other hyperlipidemia Continue healthy lifestyle changes.Check labs today.  - Lipid panel - Comprehensive metabolic panel  4. Vitamin D deficiency Low Vitamin D level contributes to fatigue and are associated with obesity, breast, and colon cancer. She agrees to continue to take prescription Vitamin D 50,000 IU every 2 weeks and will follow-up for routine testing of Vitamin D, at least 2-3 times per year to avoid over-replacement. Check labs today.  Refill- Vitamin D,  Ergocalciferol, (DRISDOL) 1.25 MG (50000 UNIT) CAPS capsule; Take one tablet twice monthly  Dispense: 2 capsule; Refill: 0  - VITAMIN D 25 Hydroxy (Vit-D Deficiency, Fractures)  5. At risk for diarrhea Susan Dorsey was given approximately 9 minutes of diarrhea prevention counseling today. She is 61 y.o. female and has risk factors  for diarrhea including medications and changes in diet. We discussed intensive lifestyle modifications today with an emphasis on specific weight loss instructions including dietary strategies.   Repetitive spaced learning was employed today to elicit superior memory formation and behavioral change.  6. Obesity with current BMI of 44.8 Susan Dorsey is currently in the action stage of change. As such, her goal is to continue with weight loss efforts. She has agreed to keeping a food journal and adhering to recommended goals of 1300-1400 calories and 90 grams protein.   Bring in daily journaling log to next OV.  Exercise goals:  As is  Behavioral modification strategies: planning for success and keeping a strict food journal.  Susan Dorsey has agreed to follow-up with our clinic in 3 weeks. She was informed of the importance of frequent follow-up visits to maximize her success with intensive lifestyle modifications for her multiple health conditions.   Susan Dorsey was informed we would discuss her lab results at her next visit unless there is a critical issue that needs to be addressed sooner. Susan Dorsey agreed to keep her next visit at the agreed upon time to discuss these results.  Objective:   Blood pressure 123/81, pulse 63, temperature 97.6 F (36.4 C), height 5\' 6"  (1.676 m), weight 277 lb (125.6 kg), last menstrual period 02/15/2020, SpO2 99 %. Body mass index is 44.71 kg/m.  General: Cooperative, alert, well developed, in no acute distress. HEENT: Conjunctivae and lids unremarkable. Cardiovascular: Regular rhythm.  Lungs: Normal work of breathing. Neurologic: No focal deficits.   Lab Results  Component Value Date   CREATININE 0.84 02/01/2022   BUN 14 02/01/2022   NA 142 02/01/2022   K 4.6 02/01/2022   CL 103 02/01/2022   CO2 26 02/01/2022   Lab Results  Component Value Date   ALT 20 02/01/2022   AST 22 02/01/2022   ALKPHOS 104 02/01/2022   BILITOT 0.4 02/01/2022   Lab Results  Component Value Date    HGBA1C 5.4 02/01/2022   HGBA1C 5.3 07/20/2021   HGBA1C 5.2 07/30/2020   HGBA1C 5.1 09/13/2018   HGBA1C 5.2 01/08/2018   Lab Results  Component Value Date   INSULIN 5.2 02/01/2022   INSULIN 3.1 07/20/2021   INSULIN 3.3 07/30/2020   INSULIN 5.3 09/13/2018   INSULIN 5.6 01/08/2018   Lab Results  Component Value Date   TSH 3.210 02/15/2021   Lab Results  Component Value Date   CHOL 193 02/01/2022   HDL 56 02/01/2022   LDLCALC 118 (H) 02/01/2022   TRIG 106 02/01/2022   CHOLHDL 3.4 02/01/2022   Lab Results  Component Value Date   VD25OH 63.2 02/01/2022   VD25OH 60.3 07/20/2021   VD25OH 45.8 07/30/2020   Lab Results  Component Value Date   WBC 7.5 07/30/2020   HGB 14.9 07/30/2020   HCT 44.1 07/30/2020   MCV 96 07/30/2020   PLT 245 07/30/2020   Lab Results  Component Value Date   IRON 76 07/30/2020   TIBC 294 07/30/2020   FERRITIN 91 07/30/2020    Attestation Statements:   Reviewed by clinician on day of visit: allergies, medications, problem list, medical history, surgical history, family history, social  history, and previous encounter notes.  Edmund Hilda, CMA, am acting as transcriptionist for [employer], DO.  I have reviewed the above documentation for accuracy and completeness, and I agree with the above. Carlye Grippe, D.O.  The 21st Century Cures Act was signed into law in 2016 which includes the topic of electronic health records.  This provides immediate access to information in MyChart.  This includes consultation notes, operative notes, office notes, lab results and pathology reports.  If you have any questions about what you read please let us know at your next visit so we can discuss your concerns and take corrective action if need be.  We are right here with you.

## 2022-02-04 ENCOUNTER — Other Ambulatory Visit (HOSPITAL_COMMUNITY): Payer: Self-pay

## 2022-02-08 ENCOUNTER — Encounter (INDEPENDENT_AMBULATORY_CARE_PROVIDER_SITE_OTHER): Payer: Self-pay | Admitting: Family Medicine

## 2022-03-01 ENCOUNTER — Ambulatory Visit (INDEPENDENT_AMBULATORY_CARE_PROVIDER_SITE_OTHER): Payer: 59 | Admitting: Family Medicine

## 2022-03-24 ENCOUNTER — Encounter (INDEPENDENT_AMBULATORY_CARE_PROVIDER_SITE_OTHER): Payer: Self-pay | Admitting: Family Medicine

## 2022-03-24 ENCOUNTER — Other Ambulatory Visit (HOSPITAL_COMMUNITY): Payer: Self-pay

## 2022-03-24 ENCOUNTER — Ambulatory Visit (INDEPENDENT_AMBULATORY_CARE_PROVIDER_SITE_OTHER): Payer: 59 | Admitting: Family Medicine

## 2022-03-24 VITALS — BP 123/80 | HR 63 | Temp 97.7°F | Ht 66.0 in | Wt 275.0 lb

## 2022-03-24 DIAGNOSIS — Z6841 Body Mass Index (BMI) 40.0 and over, adult: Secondary | ICD-10-CM

## 2022-03-24 DIAGNOSIS — E7849 Other hyperlipidemia: Secondary | ICD-10-CM

## 2022-03-24 DIAGNOSIS — E8881 Metabolic syndrome: Secondary | ICD-10-CM

## 2022-03-24 DIAGNOSIS — E559 Vitamin D deficiency, unspecified: Secondary | ICD-10-CM | POA: Diagnosis not present

## 2022-03-24 DIAGNOSIS — E669 Obesity, unspecified: Secondary | ICD-10-CM

## 2022-03-24 DIAGNOSIS — Z9189 Other specified personal risk factors, not elsewhere classified: Secondary | ICD-10-CM | POA: Diagnosis not present

## 2022-03-24 MED ORDER — VITAMIN D (ERGOCALCIFEROL) 1.25 MG (50000 UNIT) PO CAPS: ORAL_CAPSULE | ORAL | 0 refills | Status: DC

## 2022-03-24 MED ORDER — TIRZEPATIDE 5 MG/0.5ML ~~LOC~~ SOAJ
5.0000 mg | SUBCUTANEOUS | 0 refills | Status: DC
  Filled 2022-03-24: qty 2, 28d supply, fill #0

## 2022-03-30 NOTE — Progress Notes (Signed)
? ? ? ?Chief Complaint:  ? ?OBESITY ?Susan Dorsey is here to discuss her progress with her obesity treatment plan along with follow-up of her obesity related diagnoses. Susan Dorsey is on keeping a food journal and adhering to recommended goals of 1300-1400 calories and 90 grams of protein daily and states she is following her eating plan approximately 50% of the time. Susan Dorsey states she is at the gym and walking for 60-85 minutes 3-5 times per week. ? ?Today's visit was #: 28 ?Starting weight: 273 lbs ?Starting date: 07/30/2020 ?Today's weight: 275 lbs ?Today's date: 03/24/2022 ?Total lbs lost to date: 0 ?Total lbs lost since last in-office visit: 2 ? ?Interim History: Susan Dorsey is here for a follow up office visit.  We reviewed her meal plan and all questions were answered. Patient's food recall appears to be accurate and consistent with what is on plan when she is following it. When eating on plan, her hunger and cravings are well controlled. Susan Dorsey is here for lab work discussion that were drawn last office visit. She did some off the plan eating about 50% of the time, but was still mindful of her protein and eating less carbohydrates.  ? ?Subjective:  ? ?1. Other hyperlipidemia ?Susan Dorsey's last LDL was down to 118 (was 124), and HDL and triglycerides are about the same. She is not on medications. I discussed labs with the patient today. ? ?2. Insulin resistance with polyphagia ?Susan Dorsey is not covered and Susan Dorsey is not taking . Last A1c was 5.4 and insulin 5.2. She is doing well overall and her hunger is ok. I discussed labs with the patient today.  ? ?3. Vitamin D deficiency ?Susan Dorsey is currently taking prescription vitamin D 50,000 IU every 14 days. Her last Vit D level was at goal at 63. She denies nausea, vomiting or muscle weakness. ? ?4. At risk for osteoporosis ?Susan Dorsey is at higher risk of osteopenia and osteoporosis due to Vitamin D deficiency, as post menopause.  ? ?Assessment/Plan:  ?No orders of the defined types were placed in this  encounter. ? ? ?Medications Discontinued During This Encounter  ?Medication Reason  ? Vitamin D, Ergocalciferol, (DRISDOL) 1.25 MG (50000 UNIT) CAPS capsule Reorder  ? tirzepatide Susan Dorsey) 5 MG/0.5ML Pen Reorder  ? tirzepatide Susan Dorsey) 5 MG/0.5ML Pen   ?  ? ?Meds ordered this encounter  ?Medications  ? Vitamin D, Ergocalciferol, (DRISDOL) 1.25 MG (50000 UNIT) CAPS capsule  ?  Sig: Take one tablet twice monthly  ?  Dispense:  2 capsule  ?  Refill:  0  ?  30 d supply;  ** OV for RF **   Do not send RF request  ? DISCONTD: tirzepatide (MOUNJARO) 5 MG/0.5ML Pen  ?  Sig: Inject 1 pen (5 mg) into the skin once a week.  ?  Dispense:  2 mL  ?  Refill:  0  ?  30 d supply;  ** OV for RF **   Do not send RF request  ?  ? ?1. Other hyperlipidemia ?Susan Dorsey's levels have slightly improved. She will continue her prudent nutritional plan with decreased saturated and trans fats, and continue exercise.  ? ?2. Insulin resistance with polyphagia ?Susan Dorsey was advised to check with her insurance about additional medications that may be covered and let us know at her next office visit. She is to continue her prudent nutritional plan, decrease carbs, and increase protein.  ? ?3. Vitamin D deficiency ?We will refill prescription Vitamin D 50,000 IU every 14 days  for 1 month. Susan Dorsey will follow-up for routine testing of Vitamin D, at least 2-3 times per year to avoid over-replacement. ? ?- Vitamin D, Ergocalciferol, (DRISDOL) 1.25 MG (50000 UNIT) CAPS capsule; Take one tablet twice monthly  Dispense: 2 capsule; Refill: 0 ? ?4. At risk for osteoporosis ?Susan Dorsey was given approximately 9 minutes of osteoporosis prevention counseling today. Susan Dorsey is at risk for osteopenia and osteoporosis due to her Vitamin D deficiency. She was encouraged to take her Vit D and follow her higher calcium diet and increase strengthening exercise to help strengthen her bones and decrease her risk of osteopenia and osteoporosis. ? ?5. Obesity with current BMI of 44.5 ?Susan Dorsey is  currently in the action stage of change. As such, her goal is to continue with weight loss efforts. She has agreed to change to keeping a food journal and adhering to recommended goals of 1500-1600 calories and 110+ grams of protein daily.   ? ?Exercise goals: As is.  ? ?Behavioral modification strategies: decreasing eating out and planning for success. ? ?Susan Dorsey has agreed to follow-up with our clinic in 3 weeks. She was informed of the importance of frequent follow-up visits to maximize her success with intensive lifestyle modifications for her multiple health conditions.  ? ?Objective:  ? ?Blood pressure 123/80, pulse 63, temperature 97.7 ?F (36.5 ?C), height 5\' 6"  (1.676 m), weight 275 lb (124.7 kg), last menstrual period 02/15/2020, SpO2 100 %. ?Body mass index is 44.39 kg/m?. ? ?General: Cooperative, alert, well developed, in no acute distress. ?HEENT: Conjunctivae and lids unremarkable. ?Cardiovascular: Regular rhythm.  ?Lungs: Normal work of breathing. ?Neurologic: No focal deficits.  ? ?Lab Results  ?Component Value Date  ? CREATININE 0.84 02/01/2022  ? BUN 14 02/01/2022  ? NA 142 02/01/2022  ? K 4.6 02/01/2022  ? CL 103 02/01/2022  ? CO2 26 02/01/2022  ? ?Lab Results  ?Component Value Date  ? ALT 20 02/01/2022  ? AST 22 02/01/2022  ? ALKPHOS 104 02/01/2022  ? BILITOT 0.4 02/01/2022  ? ?Lab Results  ?Component Value Date  ? HGBA1C 5.4 02/01/2022  ? HGBA1C 5.3 07/20/2021  ? HGBA1C 5.2 07/30/2020  ? HGBA1C 5.1 09/13/2018  ? HGBA1C 5.2 01/08/2018  ? ?Lab Results  ?Component Value Date  ? INSULIN 5.2 02/01/2022  ? INSULIN 3.1 07/20/2021  ? INSULIN 3.3 07/30/2020  ? INSULIN 5.3 09/13/2018  ? INSULIN 5.6 01/08/2018  ? ?Lab Results  ?Component Value Date  ? TSH 3.210 02/15/2021  ? ?Lab Results  ?Component Value Date  ? CHOL 193 02/01/2022  ? HDL 56 02/01/2022  ? LDLCALC 118 (H) 02/01/2022  ? TRIG 106 02/01/2022  ? CHOLHDL 3.4 02/01/2022  ? ?Lab Results  ?Component Value Date  ? VD25OH 63.2 02/01/2022  ? VD25OH 60.3  07/20/2021  ? VD25OH 45.8 07/30/2020  ? ?Lab Results  ?Component Value Date  ? WBC 7.5 07/30/2020  ? HGB 14.9 07/30/2020  ? HCT 44.1 07/30/2020  ? MCV 96 07/30/2020  ? PLT 245 07/30/2020  ? ?Lab Results  ?Component Value Date  ? IRON 76 07/30/2020  ? TIBC 294 07/30/2020  ? FERRITIN 91 07/30/2020  ? ?Attestation Statements:  ? ?Reviewed by clinician on day of visit: allergies, medications, problem list, medical history, surgical history, family history, social history, and previous encounter notes. ? ? ?I, 09/29/2020, am acting as transcriptionist for Burt Knack, DO. ? ?I have reviewed the above documentation for accuracy and completeness, and I agree with the above. [employer]  J Marlane Hirschmann, D.O. ? ?The [place] was signed into law in 2016 which includes the topic of electronic health records.  This provides immediate access to information in MyChart.  This includes consultation notes, operative notes, office notes, lab results and pathology reports.  If you have any questions about what you read please let us know at your next visit so we can discuss your concerns and take corrective action if need be.  We are right here with you. ? ? ?

## 2022-04-14 ENCOUNTER — Ambulatory Visit (INDEPENDENT_AMBULATORY_CARE_PROVIDER_SITE_OTHER): Payer: 59 | Admitting: Nurse Practitioner

## 2022-05-09 ENCOUNTER — Ambulatory Visit (INDEPENDENT_AMBULATORY_CARE_PROVIDER_SITE_OTHER): Payer: 59 | Admitting: Nurse Practitioner

## 2022-05-09 ENCOUNTER — Encounter (INDEPENDENT_AMBULATORY_CARE_PROVIDER_SITE_OTHER): Payer: Self-pay | Admitting: Nurse Practitioner

## 2022-05-09 VITALS — BP 135/82 | HR 68 | Temp 97.9°F | Ht 66.0 in | Wt 285.0 lb

## 2022-05-09 DIAGNOSIS — Z6841 Body Mass Index (BMI) 40.0 and over, adult: Secondary | ICD-10-CM | POA: Diagnosis not present

## 2022-05-09 DIAGNOSIS — E669 Obesity, unspecified: Secondary | ICD-10-CM | POA: Diagnosis not present

## 2022-05-09 DIAGNOSIS — E559 Vitamin D deficiency, unspecified: Secondary | ICD-10-CM | POA: Diagnosis not present

## 2022-05-09 DIAGNOSIS — E7849 Other hyperlipidemia: Secondary | ICD-10-CM

## 2022-05-09 DIAGNOSIS — Z9189 Other specified personal risk factors, not elsewhere classified: Secondary | ICD-10-CM

## 2022-05-09 MED ORDER — VITAMIN D (ERGOCALCIFEROL) 1.25 MG (50000 UNIT) PO CAPS: ORAL_CAPSULE | ORAL | 0 refills | Status: DC

## 2022-05-09 NOTE — Patient Instructions (Signed)
The 10-year ASCVD risk score (Arnett DK, et al., 2019) is: 2.8% ?  Values used to calculate the score: ?    Age: 61 years ?    Sex: Female ?    Is Non-Hispanic African American: No ?    Diabetic: No ?    Tobacco smoker: No ?    Systolic Blood Pressure: [id number] mmHg ?    Is BP treated: No ?    HDL Cholesterol: 56 mg/dL ?    Total Cholesterol: 193 mg/dL ? ?

## 2022-05-11 NOTE — Progress Notes (Signed)
Chief Complaint:   OBESITY Susan Dorsey is here to discuss her progress with her obesity treatment plan along with follow-up of her obesity related diagnoses. Susan Dorsey is on keeping a food journal and adhering to recommended goals of 1500-1600 calories and 110 grams of  protein and states she is following her eating plan approximately 30% of the time. Susan Dorsey states she is doing cardio, strength, and balance for 60 minutes 3 times per week.  Today's visit was #: 96 Starting weight: 273 lbs Starting date: 07/30/2020 Today's weight: 285 lbs Today's date: 05/09/2022 Total lbs lost to date: 0 Total lbs lost since last in-office visit: 0  Interim History: Over the past month Susan Dorsey started eating more bread. She has been off Gluten for 33 years until a month ago. She had steroid injections in her right knee this past Friday. She is eating 4-5 small meals or snacks per day. She is drinking water, 1 diet soda, 1 seltzer water daily. She hasn't been tracking over the past month. She plans to get back on track. She has taken over the counter weight loss medications in the past. She craves sugar. She had a Lap Band with Dr. Hedy Camara in 2008 and conversion to Sleeve Gastrectomy in 2020.  Subjective:   1. Other hyperlipidemia Susan Dorsey has never been on statin.   2. Vitamin D deficiency Susan Dorsey's last Vitamin D was 63.2 on 02/01/2022. She is not currently taking Vitamin D. She was taking Vitamin D 50,000 IU every 14 days. She denies side effects while talking.   3. At risk for osteoporosis Susan Dorsey is at risk for osteoporosis due to history of Bariatric surgery and Vit D def.    Assessment/Plan:   1. Other hyperlipidemia Cardiovascular risk and specific lipid/LDL goals reviewed.  We discussed several lifestyle modifications today and Terrica will continue to work on diet, exercise and weight loss efforts. Orders and follow up as documented in patient record.   Counseling Intensive lifestyle modifications are the first line treatment for  this issue. Dietary changes: Increase soluble fiber. Decrease simple carbohydrates. Exercise changes: Moderate to vigorous-intensity aerobic activity 150 minutes per week if tolerated. Lipid-lowering medications: see documented in medical record.  2. Vitamin D deficiency Low Vitamin D level contributes to fatigue and are associated with obesity, breast, and colon cancer. Susan Dorsey agrees to restart prescription Vitamin D 50,000 IU every 14 days for 1 month with no refills and she will follow-up for routine testing of Vitamin D, at least 2-3 times per year to avoid over-replacement. We discussed side effects today.   - Vitamin D, Ergocalciferol, (DRISDOL) 1.25 MG (50000 UNIT) CAPS capsule; Take one tablet twice monthly  Dispense: 2 capsule; Refill: 0  3. At risk for osteoporosis Susan Dorsey was given approximately 15 minutes of osteoporosis prevention counseling today. Susan Dorsey is at risk for osteopenia and osteoporosis due to her Vitamin D deficiency. She was encouraged to take her Vit D and follow her higher calcium diet and increase strengthening exercise to help strengthen her bones and decrease her risk of osteopenia and osteoporosis.   4. Obesity with current BMI of 46.1 Susan Dorsey is currently in the action stage of change. As such, her goal is to continue with weight loss efforts. She has agreed to keeping a food journal and adhering to recommended goals of 1500-1600 calories and 110 grams of protein.   Exercise goals:  As is.   Behavioral modification strategies: increasing lean protein intake, increasing water intake, and keeping a strict food journal.  Susan Dorsey has agreed to follow-up with our clinic in 4 weeks. She was informed of the importance of frequent follow-up visits to maximize her success with intensive lifestyle modifications for her multiple health conditions.   Objective:   Blood pressure 135/82, pulse 68, temperature 97.9 F (36.6 C), height 5\' 6"  (1.676 m), weight 285 lb (129.3 kg), last menstrual  period 02/15/2020, SpO2 100 %. Body mass index is 46 kg/m.  General: Cooperative, alert, well developed, in no acute distress. HEENT: Conjunctivae and lids unremarkable. Cardiovascular: Regular rhythm.  Lungs: Normal work of breathing. Neurologic: No focal deficits.   Lab Results  Component Value Date   CREATININE 0.84 02/01/2022   BUN 14 02/01/2022   NA 142 02/01/2022   K 4.6 02/01/2022   CL 103 02/01/2022   CO2 26 02/01/2022   Lab Results  Component Value Date   ALT 20 02/01/2022   AST 22 02/01/2022   ALKPHOS 104 02/01/2022   BILITOT 0.4 02/01/2022   Lab Results  Component Value Date   HGBA1C 5.4 02/01/2022   HGBA1C 5.3 07/20/2021   HGBA1C 5.2 07/30/2020   HGBA1C 5.1 09/13/2018   HGBA1C 5.2 01/08/2018   Lab Results  Component Value Date   INSULIN 5.2 02/01/2022   INSULIN 3.1 07/20/2021   INSULIN 3.3 07/30/2020   INSULIN 5.3 09/13/2018   INSULIN 5.6 01/08/2018   Lab Results  Component Value Date   TSH 3.210 02/15/2021   Lab Results  Component Value Date   CHOL 193 02/01/2022   HDL 56 02/01/2022   LDLCALC 118 (H) 02/01/2022   TRIG 106 02/01/2022   CHOLHDL 3.4 02/01/2022   Lab Results  Component Value Date   VD25OH 63.2 02/01/2022   VD25OH 60.3 07/20/2021   VD25OH 45.8 07/30/2020   Lab Results  Component Value Date   WBC 7.5 07/30/2020   HGB 14.9 07/30/2020   HCT 44.1 07/30/2020   MCV 96 07/30/2020   PLT 245 07/30/2020   Lab Results  Component Value Date   IRON 76 07/30/2020   TIBC 294 07/30/2020   FERRITIN 91 07/30/2020   Attestation Statements:   Reviewed by clinician on day of visit: allergies, medications, problem list, medical history, surgical history, family history, social history, and previous encounter notes.  I, Lizbeth Bark, RMA, am acting as Location manager for Everardo Pacific, FNP.  I have reviewed the above documentation for accuracy and completeness, and I agree with the above. Everardo Pacific, FNP

## 2022-05-12 ENCOUNTER — Encounter (INDEPENDENT_AMBULATORY_CARE_PROVIDER_SITE_OTHER): Payer: Self-pay | Admitting: Nurse Practitioner

## 2022-05-17 NOTE — Telephone Encounter (Signed)
Review please

## 2022-05-24 MED ORDER — WEGOVY 0.25 MG/0.5ML ~~LOC~~ SOAJ: 0.2500 mg | SUBCUTANEOUS | 0 refills | Status: DC

## 2022-05-24 NOTE — Telephone Encounter (Signed)
There is no rx on file for [facility] and I don't see that it was discussed in the last office note. Unable to do prior authorization.

## 2022-05-26 ENCOUNTER — Telehealth (INDEPENDENT_AMBULATORY_CARE_PROVIDER_SITE_OTHER): Payer: Self-pay | Admitting: Family Medicine

## 2022-05-26 ENCOUNTER — Encounter (INDEPENDENT_AMBULATORY_CARE_PROVIDER_SITE_OTHER): Payer: Self-pay

## 2022-05-26 NOTE — Telephone Encounter (Signed)
FYI

## 2022-05-26 NOTE — Telephone Encounter (Signed)
Dawn [employer] - Prior authorization approved for Wegovy 0.25MG . Effective: 05/26/2022 - 09/25/2022. Patient sent approval message via mychart.

## 2022-06-01 ENCOUNTER — Other Ambulatory Visit (INDEPENDENT_AMBULATORY_CARE_PROVIDER_SITE_OTHER): Payer: Self-pay | Admitting: Nurse Practitioner

## 2022-06-01 NOTE — Telephone Encounter (Signed)
Susan Dorsey

## 2022-06-01 NOTE — Addendum Note (Signed)
Addended by: Teressa Senter on: 06/01/2022 04:15 PM   Modules accepted: Orders

## 2022-06-02 ENCOUNTER — Other Ambulatory Visit (INDEPENDENT_AMBULATORY_CARE_PROVIDER_SITE_OTHER): Payer: Self-pay | Admitting: Nurse Practitioner

## 2022-06-02 MED ORDER — WEGOVY 0.25 MG/0.5ML ~~LOC~~ SOAJ: 0.2500 mg | SUBCUTANEOUS | 0 refills | Status: DC

## 2022-06-09 ENCOUNTER — Ambulatory Visit (INDEPENDENT_AMBULATORY_CARE_PROVIDER_SITE_OTHER): Payer: 59 | Admitting: Nurse Practitioner

## 2022-06-09 ENCOUNTER — Encounter (INDEPENDENT_AMBULATORY_CARE_PROVIDER_SITE_OTHER): Payer: Self-pay | Admitting: Nurse Practitioner

## 2022-06-09 VITALS — BP 132/78 | HR 64 | Temp 97.8°F | Ht 66.0 in | Wt 279.0 lb

## 2022-06-09 DIAGNOSIS — Z6841 Body Mass Index (BMI) 40.0 and over, adult: Secondary | ICD-10-CM | POA: Diagnosis not present

## 2022-06-09 DIAGNOSIS — E669 Obesity, unspecified: Secondary | ICD-10-CM

## 2022-06-09 DIAGNOSIS — E559 Vitamin D deficiency, unspecified: Secondary | ICD-10-CM | POA: Diagnosis not present

## 2022-06-09 MED ORDER — VITAMIN D (ERGOCALCIFEROL) 1.25 MG (50000 UNIT) PO CAPS: ORAL_CAPSULE | ORAL | 0 refills | Status: DC

## 2022-06-13 ENCOUNTER — Other Ambulatory Visit (INDEPENDENT_AMBULATORY_CARE_PROVIDER_SITE_OTHER): Payer: Self-pay

## 2022-06-13 MED ORDER — SAXENDA 18 MG/3ML ~~LOC~~ SOPN: PEN_INJECTOR | SUBCUTANEOUS | 0 refills | Status: DC

## 2022-06-13 NOTE — Addendum Note (Signed)
Addended by: Irene Limbo on: 06/13/2022 03:48 PM   Modules accepted: Orders

## 2022-06-13 NOTE — Progress Notes (Signed)
Chief Complaint:   OBESITY Susan Dorsey is here to discuss her progress with her obesity treatment plan along with follow-up of her obesity related diagnoses. Susan Dorsey is on keeping a food journal and adhering to recommended goals of 1600 calories and 100 grams of protein and states she is following her eating plan approximately 50% of the time. Susan Dorsey states she is exercising at gym 60 minutes 3 times per week.  Today's visit was #: 30 Starting weight: 273 lbs Starting date: 07/30/2020 Today's weight: 279 lbs Today's date: 06/09/2022 Total lbs lost to date: 0 Total lbs lost since last in-office visit: 6  Interim History: Susan Dorsey has done well with weight loss since last visit. I had prescribed her Wegovy at last visit but she has not started taking it yet. She is struggling with snacking before bedtime. She's drinking 64 oz of water daily.   Subjective:   1. Vitamin D deficiency Susan Dorsey is currently taking prescription Vit D 50,000 IU once a week. Denies any nausea, vomiting or muscle weakness.  Assessment/Plan:   1. Vitamin D deficiency We will Refill Vit D 50,000 IU once a week for 1 month with 0 refills. Side effects discussed.   Low Vitamin D level contributes to fatigue and are associated with obesity, breast, and colon cancer. She agrees to continue to take prescription Vitamin D @50 ,000 IU every week and will follow-up for routine testing of Vitamin D, at least 2-3 times per year to avoid over-replacement.   -Refill Vitamin D, Ergocalciferol, (DRISDOL) 1.25 MG (50000 UNIT) CAPS capsule; Take one tablet twice monthly  Dispense: 2 capsule; Refill: 0  2. Obesity current BMI 45.1 Susan Dorsey is currently in the action stage of change. As such, her goal is to continue with weight loss efforts. She has agreed to keeping a food journal and adhering to recommended goals of 1600 calories and 100 grams of protein.   Exercise goals: As is.   Susan Dorsey is to let me know if she decides to start [facility].  Behavioral  modification strategies: increasing water intake, meal planning and cooking strategies, and planning for success.  Susan Dorsey has agreed to follow-up with our clinic in 4 weeks. She was informed of the importance of frequent follow-up visits to maximize her success with intensive lifestyle modifications for her multiple health conditions.   Objective:   Blood pressure 132/78, pulse 64, temperature 97.8 F (36.6 C), height 5\' 6"  (1.676 m), weight 279 lb (126.6 kg), last menstrual period 02/15/2020, SpO2 99 %. Body mass index is 45.03 kg/m.  General: Cooperative, alert, well developed, in no acute distress. HEENT: Conjunctivae and lids unremarkable. Cardiovascular: Regular rhythm.  Lungs: Normal work of breathing. Neurologic: No focal deficits.   Lab Results  Component Value Date   CREATININE 0.84 02/01/2022   BUN 14 02/01/2022   NA 142 02/01/2022   K 4.6 02/01/2022   CL 103 02/01/2022   CO2 26 02/01/2022   Lab Results  Component Value Date   ALT 20 02/01/2022   AST 22 02/01/2022   ALKPHOS 104 02/01/2022   BILITOT 0.4 02/01/2022   Lab Results  Component Value Date   HGBA1C 5.4 02/01/2022   HGBA1C 5.3 07/20/2021   HGBA1C 5.2 07/30/2020   HGBA1C 5.1 09/13/2018   HGBA1C 5.2 01/08/2018   Lab Results  Component Value Date   INSULIN 5.2 02/01/2022   INSULIN 3.1 07/20/2021   INSULIN 3.3 07/30/2020   INSULIN 5.3 09/13/2018   INSULIN 5.6 01/08/2018   Lab Results  Component Value Date   TSH 3.210 02/15/2021   Lab Results  Component Value Date   CHOL 193 02/01/2022   HDL 56 02/01/2022   LDLCALC 118 (H) 02/01/2022   TRIG 106 02/01/2022   CHOLHDL 3.4 02/01/2022   Lab Results  Component Value Date   VD25OH 63.2 02/01/2022   VD25OH 60.3 07/20/2021   VD25OH 45.8 07/30/2020   Lab Results  Component Value Date   WBC 7.5 07/30/2020   HGB 14.9 07/30/2020   HCT 44.1 07/30/2020   MCV 96 07/30/2020   PLT 245 07/30/2020   Lab Results  Component Value Date   IRON 76  07/30/2020   TIBC 294 07/30/2020   FERRITIN 91 07/30/2020    Attestation Statements:   Reviewed by clinician on day of visit: allergies, medications, problem list, medical history, surgical history, family history, social history, and previous encounter notes.  I, Brendell Tyus, RMA, am acting as transcriptionist for Irene Limbo, FNP..  I have reviewed the above documentation for accuracy and completeness, and I agree with the above. Irene Limbo, FNP

## 2022-06-13 NOTE — Addendum Note (Signed)
Addended by: Teressa Senter on: 06/13/2022 04:17 PM   Modules accepted: Orders

## 2022-06-14 ENCOUNTER — Other Ambulatory Visit (INDEPENDENT_AMBULATORY_CARE_PROVIDER_SITE_OTHER): Payer: Self-pay | Admitting: Nurse Practitioner

## 2022-06-14 MED ORDER — SAXENDA 18 MG/3ML ~~LOC~~ SOPN: PEN_INJECTOR | SUBCUTANEOUS | 0 refills | Status: DC

## 2022-06-15 ENCOUNTER — Encounter (INDEPENDENT_AMBULATORY_CARE_PROVIDER_SITE_OTHER): Payer: Self-pay

## 2022-06-15 ENCOUNTER — Telehealth (INDEPENDENT_AMBULATORY_CARE_PROVIDER_SITE_OTHER): Payer: Self-pay | Admitting: Nurse Practitioner

## 2022-06-15 ENCOUNTER — Encounter (INDEPENDENT_AMBULATORY_CARE_PROVIDER_SITE_OTHER): Payer: Self-pay | Admitting: Nurse Practitioner

## 2022-06-15 NOTE — Telephone Encounter (Signed)
Susan Dorsey - Prior authorization approved for Saxenda. Effective: 06/14/2022-10/14/2022. Patient sent approval message via mychart.

## 2022-06-22 ENCOUNTER — Other Ambulatory Visit (INDEPENDENT_AMBULATORY_CARE_PROVIDER_SITE_OTHER): Payer: Self-pay | Admitting: Nurse Practitioner

## 2022-06-22 MED ORDER — INSULIN PEN NEEDLE 31G X 5 MM MISC: 0 refills | Status: DC

## 2022-06-22 NOTE — Telephone Encounter (Signed)
Pt requesting needles for Saxenda.

## 2022-07-07 ENCOUNTER — Ambulatory Visit (INDEPENDENT_AMBULATORY_CARE_PROVIDER_SITE_OTHER): Payer: 59 | Admitting: Nurse Practitioner

## 2022-07-07 ENCOUNTER — Encounter (INDEPENDENT_AMBULATORY_CARE_PROVIDER_SITE_OTHER): Payer: Self-pay | Admitting: Nurse Practitioner

## 2022-07-07 VITALS — BP 140/85 | HR 68 | Temp 97.9°F | Ht 66.0 in | Wt 279.0 lb

## 2022-07-07 DIAGNOSIS — E559 Vitamin D deficiency, unspecified: Secondary | ICD-10-CM

## 2022-07-07 DIAGNOSIS — Z6841 Body Mass Index (BMI) 40.0 and over, adult: Secondary | ICD-10-CM | POA: Diagnosis not present

## 2022-07-07 DIAGNOSIS — R110 Nausea: Secondary | ICD-10-CM | POA: Diagnosis not present

## 2022-07-07 MED ORDER — VITAMIN D (ERGOCALCIFEROL) 1.25 MG (50000 UNIT) PO CAPS: ORAL_CAPSULE | ORAL | 0 refills | Status: DC

## 2022-07-07 MED ORDER — SAXENDA 18 MG/3ML ~~LOC~~ SOPN: PEN_INJECTOR | SUBCUTANEOUS | 0 refills | Status: DC

## 2022-07-07 MED ORDER — ONDANSETRON HCL 4 MG PO TABS: 4.0000 mg | ORAL_TABLET | Freq: Three times a day (TID) | ORAL | 0 refills | Status: DC | PRN

## 2022-07-11 NOTE — Progress Notes (Signed)
Chief Complaint:   OBESITY Susan Dorsey is here to discuss her progress with her obesity treatment plan along with follow-up of her obesity related diagnoses. Susan Dorsey is on keeping a food journal and adhering to recommended goals of 1600 calories and 100 grams of protein and states she is following her eating plan approximately 50% of the time. Susan Dorsey states she is walking 60 minutes 3 times per week.  Today's visit was #: 31 Starting weight: 273 lbs Starting date: 07/30/2020 Today's weight: 279 lbs Today's date: 07/07/2022 Total lbs lost to date: 0 Total lbs lost since last in-office visit: 0  Interim History: Dezyre is currently on Saxenda 1.2 mg. Notes occasional side effects of diarrhea and vomiting. When she was taking Mounjaro she vomited daily. Notes diarrhea and vomiting when increasing Saxenda dose. Feels fuller with Saxenda then she did with Mounjaro. Not as hungry at night as she used to be.   Subjective:   1. Vitamin D deficiency Susan Dorsey  is currently taking prescription Vit D 50,000 IU twice a month. Denies any nausea, vomiting or muscle weakness.  2. Nausea Susan Dorsey has nausea starting Saxenda. She has taken Zofran in the past.   Assessment/Plan:   1. Vitamin D deficiency We will refill Vit D 50,000 IU twice monthly for 1 month with 0 refills.  Low Vitamin D level contributes to fatigue and are associated with obesity, breast, and colon cancer. She agrees to continue to take prescription Vitamin D @50 ,000 IU every week and will follow-up for routine testing of Vitamin D, at least 2-3 times per year to avoid over-replacement.   -Refill Vitamin D, Ergocalciferol, (DRISDOL) 1.25 MG (50000 UNIT) CAPS capsule; Take one tablet twice monthly  Dispense: 2 capsule; Refill: 0  2. Nausea We will START Zofran 4 mg as needed when increasing Saxenda.   -Start ondansetron (ZOFRAN) 4 MG tablet; Take 1 tablet (4 mg total) by mouth every 8 (eight) hours as needed for nausea or vomiting.  Dispense: 20  tablet; Refill: 0  3. Class 3 severe obesity with serious comorbidity and body mass index (BMI) of 40.0 to 44.9 in adult, unspecified obesity type (HCC):current BMI 45.2 Susan Dorsey will continue Saxenda and INCREASE as tolerated. Would like to check labs in 1-2 months.  -INCREASE as tolerated to 1.8mg   Susan -Weight Management (SAXENDA) 18 MG/3ML SOPN; Take 3.0mg  SQ daily.  Dispense: 15 mL; Refill: 0  Susan Dorsey is currently in the action stage of change. As such, her goal is to continue with weight loss efforts. She has agreed to keeping a food journal and adhering to recommended goals of 1500 calories and 100+ grams of protein.   Exercise goals: As is.  Behavioral modification strategies: increasing lean protein intake, increasing water intake, and planning for success.  Susan Dorsey has agreed to follow-up with our clinic in 4 weeks. She was informed of the importance of frequent follow-up visits to maximize her success with intensive lifestyle modifications for her multiple health conditions.   Objective:   Blood pressure 140/85, pulse 68, temperature 97.9 F (36.6 C), height 5\' 6"  (1.676 m), weight 279 lb (126.6 kg), last menstrual period 02/15/2020, SpO2 96 %. Body mass index is 45.03 kg/m.  General: Cooperative, alert, well developed, in no acute distress. HEENT: Conjunctivae and lids unremarkable. Cardiovascular: Regular rhythm.  Lungs: Normal work of breathing. Neurologic: No focal deficits.   Lab Results  Component Value Date   CREATININE 0.84 02/01/2022   BUN 14 02/01/2022   NA 142 02/01/2022  K 4.6 02/01/2022   CL 103 02/01/2022   CO2 26 02/01/2022   Lab Results  Component Value Date   ALT 20 02/01/2022   AST 22 02/01/2022   ALKPHOS 104 02/01/2022   BILITOT 0.4 02/01/2022   Lab Results  Component Value Date   HGBA1C 5.4 02/01/2022   HGBA1C 5.3 07/20/2021   HGBA1C 5.2 07/30/2020   HGBA1C 5.1 09/13/2018   HGBA1C 5.2 01/08/2018   Lab Results  Component Value Date    INSULIN 5.2 02/01/2022   INSULIN 3.1 07/20/2021   INSULIN 3.3 07/30/2020   INSULIN 5.3 09/13/2018   INSULIN 5.6 01/08/2018   Lab Results  Component Value Date   TSH 3.210 02/15/2021   Lab Results  Component Value Date   CHOL 193 02/01/2022   HDL 56 02/01/2022   LDLCALC 118 (H) 02/01/2022   TRIG 106 02/01/2022   CHOLHDL 3.4 02/01/2022   Lab Results  Component Value Date   VD25OH 63.2 02/01/2022   VD25OH 60.3 07/20/2021   VD25OH 45.8 07/30/2020   Lab Results  Component Value Date   WBC 7.5 07/30/2020   HGB 14.9 07/30/2020   HCT 44.1 07/30/2020   MCV 96 07/30/2020   PLT 245 07/30/2020   Lab Results  Component Value Date   IRON 76 07/30/2020   TIBC 294 07/30/2020   FERRITIN 91 07/30/2020   Attestation Statements:   Reviewed by clinician on day of visit: allergies, medications, problem list, medical history, surgical history, family history, social history, and previous encounter notes.  I, Brendell Tyus, RMA, am acting as transcriptionist for Irene Limbo, FNP.  I have reviewed the above documentation for accuracy and completeness, and I agree with the above. Irene Limbo, FNP

## 2022-07-12 ENCOUNTER — Encounter (INDEPENDENT_AMBULATORY_CARE_PROVIDER_SITE_OTHER): Payer: Self-pay | Admitting: Nurse Practitioner

## 2022-07-13 ENCOUNTER — Telehealth (INDEPENDENT_AMBULATORY_CARE_PROVIDER_SITE_OTHER): Payer: Self-pay | Admitting: Nurse Practitioner

## 2022-07-13 NOTE — Telephone Encounter (Signed)
Pt called stating that the medication Susan Dorsey is making her sleep a lot. Please call pt at the number on file. Patient also has a rash at the injection site. [facility].

## 2022-07-14 NOTE — Telephone Encounter (Signed)
I spoke with the pt to address concerns below. She states that she slept a lot yesterday. She decreased her dose per Stephanie's directions and has no further concerns. Rash has subsided.

## 2022-08-03 ENCOUNTER — Encounter (INDEPENDENT_AMBULATORY_CARE_PROVIDER_SITE_OTHER): Payer: Self-pay

## 2022-08-10 ENCOUNTER — Ambulatory Visit (INDEPENDENT_AMBULATORY_CARE_PROVIDER_SITE_OTHER): Payer: 59 | Admitting: Nurse Practitioner

## 2022-08-10 ENCOUNTER — Encounter (INDEPENDENT_AMBULATORY_CARE_PROVIDER_SITE_OTHER): Payer: Self-pay | Admitting: Nurse Practitioner

## 2022-08-10 VITALS — BP 115/75 | HR 74 | Temp 97.9°F | Ht 66.0 in | Wt 281.0 lb

## 2022-08-10 DIAGNOSIS — E669 Obesity, unspecified: Secondary | ICD-10-CM | POA: Diagnosis not present

## 2022-08-10 DIAGNOSIS — E559 Vitamin D deficiency, unspecified: Secondary | ICD-10-CM | POA: Diagnosis not present

## 2022-08-10 DIAGNOSIS — K912 Postsurgical malabsorption, not elsewhere classified: Secondary | ICD-10-CM

## 2022-08-10 DIAGNOSIS — Z6841 Body Mass Index (BMI) 40.0 and over, adult: Secondary | ICD-10-CM

## 2022-08-10 MED ORDER — VITAMIN D (ERGOCALCIFEROL) 1.25 MG (50000 UNIT) PO CAPS: ORAL_CAPSULE | ORAL | 0 refills | Status: DC

## 2022-08-10 MED ORDER — INSULIN PEN NEEDLE 31G X 6 MM MISC: 0 refills | Status: DC

## 2022-08-10 MED ORDER — SAXENDA 18 MG/3ML ~~LOC~~ SOPN: PEN_INJECTOR | SUBCUTANEOUS | 0 refills | Status: DC

## 2022-08-12 LAB — CBC WITH DIFFERENTIAL/PLATELET
Basophils Absolute: 0.1 10*3/uL (ref 0.0–0.2)
Basos: 1 %
EOS (ABSOLUTE): 0.1 10*3/uL (ref 0.0–0.4)
Eos: 1 %
Hematocrit: 44.7 % (ref 34.0–46.6)
Hemoglobin: 14.8 g/dL (ref 11.1–15.9)
Immature Grans (Abs): 0 10*3/uL (ref 0.0–0.1)
Immature Granulocytes: 0 %
Lymphocytes Absolute: 1.9 10*3/uL (ref 0.7–3.1)
Lymphs: 29 %
MCH: 31.1 pg (ref 26.6–33.0)
MCHC: 33.1 g/dL (ref 31.5–35.7)
MCV: 94 fL (ref 79–97)
Monocytes Absolute: 0.5 10*3/uL (ref 0.1–0.9)
Monocytes: 8 %
Neutrophils Absolute: 4 10*3/uL (ref 1.4–7.0)
Neutrophils: 61 %
Platelets: 247 10*3/uL (ref 150–450)
RBC: 4.76 x10E6/uL (ref 3.77–5.28)
RDW: 12.5 % (ref 11.7–15.4)
WBC: 6.5 10*3/uL (ref 3.4–10.8)

## 2022-08-12 LAB — COMPREHENSIVE METABOLIC PANEL
ALT: 20 IU/L (ref 0–32)
AST: 18 IU/L (ref 0–40)
Albumin/Globulin Ratio: 1.8 (ref 1.2–2.2)
Albumin: 4.2 g/dL (ref 3.8–4.9)
Alkaline Phosphatase: 109 IU/L (ref 44–121)
BUN/Creatinine Ratio: 15 (ref 9–23)
BUN: 13 mg/dL (ref 6–24)
Bilirubin Total: 0.4 mg/dL (ref 0.0–1.2)
CO2: 25 mmol/L (ref 20–29)
Calcium: 9.6 mg/dL (ref 8.7–10.2)
Chloride: 100 mmol/L (ref 96–106)
Creatinine, Ser: 0.85 mg/dL (ref 0.57–1.00)
Globulin, Total: 2.4 g/dL (ref 1.5–4.5)
Glucose: 75 mg/dL (ref 70–99)
Potassium: 4.7 mmol/L (ref 3.5–5.2)
Sodium: 141 mmol/L (ref 134–144)
Total Protein: 6.6 g/dL (ref 6.0–8.5)
eGFR: 79 mL/min/{1.73_m2} (ref >59)

## 2022-08-12 LAB — PTH, INTACT AND CALCIUM: PTH: 17 pg/mL (ref 15–65)

## 2022-08-12 LAB — TSH: TSH: 3.53 u[IU]/mL (ref 0.450–4.500)

## 2022-08-12 LAB — VITAMIN B12: Vitamin B-12: 1362 pg/mL — ABNORMAL HIGH (ref 232–1245)

## 2022-08-12 LAB — VITAMIN D 25 HYDROXY (VIT D DEFICIENCY, FRACTURES): Vit D, 25-Hydroxy: 61.9 ng/mL (ref 30.0–100.0)

## 2022-08-12 LAB — FOLATE: Folate: >20 ng/mL (ref >3.0)

## 2022-08-12 LAB — FERRITIN: Ferritin: 71 ng/mL (ref 15–150)

## 2022-08-18 ENCOUNTER — Ambulatory Visit (HOSPITAL_BASED_OUTPATIENT_CLINIC_OR_DEPARTMENT_OTHER)
Admission: RE | Admit: 2022-08-18 | Discharge: 2022-08-18 | Disposition: A | Discharge status: Home / Self Care | Payer: 59 | Source: Ambulatory Visit | Attending: Nurse Practitioner | Admitting: Nurse Practitioner

## 2022-08-18 DIAGNOSIS — K912 Postsurgical malabsorption, not elsewhere classified: Secondary | ICD-10-CM

## 2022-08-18 NOTE — Progress Notes (Signed)
Chief Complaint:   OBESITY Susan Dorsey is here to discuss her progress with her obesity treatment plan along with follow-up of her obesity related diagnoses. Susan Dorsey is on keeping a food journal and adhering to recommended goals of 1600 calories and 100 grams of protein and states she is following her eating plan approximately 50% of the time. Susan Dorsey states she is doing gym exercises 60 minutes 3 times per week.  Today's visit was #: 32 Starting weight: 273 lbs Starting date: 07/30/2020 Today's weight: 281 lbs Today's date: 08/10/2022 Total lbs lost to date: 0 lbs Total lbs lost since last in-office visit: 0  Interim History: Susan Dorsey's highest weight was 380 lbs. She is taking Saxenda 1.2 mg +7 clicks. Notes some redness at the site of injection. Had one episode of vomiting and diarrhea. Takes Zofran as needed. She plans to increase to 1.8 mg this Saturday. She took [facility] in the past-vomited daily. Some hunger and cravings. She is drinking coffee, diet soda, water and flavored water. She is not measuring and weighing food. She eats a lot of fruit.  Subjective:   1. Vitamin D deficiency Susan Dorsey is currently taking prescription Vit D 50,000 IU once a week. Denies any nausea, vomiting or muscle weakness.  2. Postoperative malabsorption Lap band with Dr. Simona Huh in 2008 with conversion to SG in 2020.  Assessment/Plan:   1. Vitamin D deficiency We will obtain labs today. We will refill Vit D 50,000 IU once a week for 1 month with 0 refills.  Low Vitamin D level contributes to fatigue and are associated with obesity, breast, and colon cancer. She agrees to continue to take prescription Vitamin D @50 ,000 IU every week and will follow-up for routine testing of Vitamin D, at least 2-3 times per year to avoid over-replacement.   -Refill Vitamin D, Ergocalciferol, (DRISDOL) 1.25 MG (50000 UNIT) CAPS capsule; Take one tablet twice monthly  Dispense: 2 capsule; Refill: 0  - VITAMIN D 25 Hydroxy (Vit-D Deficiency,  Fractures)  2. Postoperative malabsorption We will obtain labs today. Dexa order placed today.  - Comprehensive metabolic panel - TSH - Ferritin - Folate - Vitamin B12 - CBC with Differential/Platelet - PTH, Intact and Calcium - DG Bone Density; Future  3. Obesity with a current BMI of 45.5 Susan Dorsey will increase Saxenda to 1.8 mg and change needle size to 31x 6, to see if will help with redness. Dexa and labs obtained today.  -Refill/increase Liraglutide -Weight Management (SAXENDA) 18 MG/3ML SOPN; Take 3.0mg  SQ daily.  Dispense: 15 mL; Refill: 0  -Refill Insulin Pen Needle 31G X 6 MM MISC; Take as directed with Saxenda  Dispense: 30 each; Refill: 0  Susan Dorsey is currently in the action stage of change. As such, her goal is to continue with weight loss efforts. She has agreed to keeping a food journal and adhering to recommended goals of 1500 calories and 100+ grams of protein.   Exercise goals: As is.  Behavioral modification strategies: no skipping meals, planning for success, and keeping a strict food journal.  Susan Dorsey has agreed to follow-up with our clinic in 4 weeks. She was informed of the importance of frequent follow-up visits to maximize her success with intensive lifestyle modifications for her multiple health conditions.   Susan Dorsey was informed we would discuss her lab results at her next visit unless there is a critical issue that needs to be addressed sooner. Susan Dorsey agreed to keep her next visit at the agreed upon time to discuss these  results.  Objective:   Blood pressure 115/75, pulse 74, temperature 97.9 F (36.6 C), height 5\' 6"  (1.676 m), weight 281 lb (127.5 kg), last menstrual period 02/15/2020, SpO2 97 %. Body mass index is 45.35 kg/m.  General: Cooperative, alert, well developed, in no acute distress. HEENT: Conjunctivae and lids unremarkable. Cardiovascular: Regular rhythm.  Lungs: Normal work of breathing. Neurologic: No focal deficits.   Lab Results  Component Value  Date   CREATININE 0.85 08/10/2022   BUN 13 08/10/2022   NA 141 08/10/2022   K 4.7 08/10/2022   CL 100 08/10/2022   CO2 25 08/10/2022   Lab Results  Component Value Date   ALT 20 08/10/2022   AST 18 08/10/2022   ALKPHOS 109 08/10/2022   BILITOT 0.4 08/10/2022   Lab Results  Component Value Date   HGBA1C 5.4 02/01/2022   HGBA1C 5.3 07/20/2021   HGBA1C 5.2 07/30/2020   HGBA1C 5.1 09/13/2018   HGBA1C 5.2 01/08/2018   Lab Results  Component Value Date   INSULIN 5.2 02/01/2022   INSULIN 3.1 07/20/2021   INSULIN 3.3 07/30/2020   INSULIN 5.3 09/13/2018   INSULIN 5.6 01/08/2018   Lab Results  Component Value Date   TSH 3.530 08/10/2022   Lab Results  Component Value Date   CHOL 193 02/01/2022   HDL 56 02/01/2022   LDLCALC 118 (H) 02/01/2022   TRIG 106 02/01/2022   CHOLHDL 3.4 02/01/2022   Lab Results  Component Value Date   VD25OH 61.9 08/10/2022   VD25OH 63.2 02/01/2022   VD25OH 60.3 07/20/2021   Lab Results  Component Value Date   WBC 6.5 08/10/2022   HGB 14.8 08/10/2022   HCT 44.7 08/10/2022   MCV 94 08/10/2022   PLT 247 08/10/2022   Lab Results  Component Value Date   IRON 76 07/30/2020   TIBC 294 07/30/2020   FERRITIN 71 08/10/2022   Attestation Statements:   Reviewed by clinician on day of visit: allergies, medications, problem list, medical history, surgical history, family history, social history, and previous encounter notes.  I, Brendell Tyus, RMA, am acting as transcriptionist for 08/12/2022, FNP.  I have reviewed the above documentation for accuracy and completeness, and I agree with the above. Irene Limbo, FNP

## 2022-09-08 ENCOUNTER — Ambulatory Visit (INDEPENDENT_AMBULATORY_CARE_PROVIDER_SITE_OTHER): Payer: 59 | Admitting: Nurse Practitioner

## 2022-09-08 ENCOUNTER — Encounter (INDEPENDENT_AMBULATORY_CARE_PROVIDER_SITE_OTHER): Payer: Self-pay | Admitting: Nurse Practitioner

## 2022-09-08 VITALS — BP 138/88 | HR 70 | Temp 98.0°F | Ht 66.0 in | Wt 279.0 lb

## 2022-09-08 DIAGNOSIS — E669 Obesity, unspecified: Secondary | ICD-10-CM | POA: Diagnosis not present

## 2022-09-08 DIAGNOSIS — Z6841 Body Mass Index (BMI) 40.0 and over, adult: Secondary | ICD-10-CM | POA: Diagnosis not present

## 2022-09-08 DIAGNOSIS — E559 Vitamin D deficiency, unspecified: Secondary | ICD-10-CM

## 2022-09-08 DIAGNOSIS — R748 Abnormal levels of other serum enzymes: Secondary | ICD-10-CM

## 2022-09-08 MED ORDER — VITAMIN D (ERGOCALCIFEROL) 1.25 MG (50000 UNIT) PO CAPS: ORAL_CAPSULE | ORAL | 0 refills | Status: DC

## 2022-09-08 MED ORDER — SAXENDA 18 MG/3ML ~~LOC~~ SOPN: PEN_INJECTOR | SUBCUTANEOUS | 0 refills | Status: DC

## 2022-09-12 NOTE — Progress Notes (Deleted)
Chief Complaint:   OBESITY Susan Dorsey is here to discuss her progress with her obesity treatment plan along with follow-up of her obesity related diagnoses. Susan Dorsey is on the Category 3 Plan and states she is following her eating plan approximately 75% of the time. Susan Dorsey states she is exercising 0 minutes 0 times per week.  Today's visit was #: 71 Starting weight: 273 lbs Starting date: 07/30/2020 Today's weight: 279 lbs Today's date: 09/08/2022 Total lbs lost to date: 0 lbs Total lbs lost since last in-office visit: 2  Interim History: Susan Dorsey is working 12 hour days. A lot of stress at work and has been stress eating. She does not eat a lot during the day. Eating more when she get home. Missing lunch at work. She is s/p [employer] in 2017 with Dr. Hassell Done. Highest weight 275 lbs and Nadir weight 200 lbs. She is drinking a protein shake, water, coffee and tea. Denies sodas. She has a hard time following plan due to work schedule.  Subjective:   1. Vitamin D deficiency Susan Dorsey is currently taking prescription Vit D 50,000 IU once a week.   2. Insulin resistance Susan Dorsey has never been on medication. Struggles with cravings.  Assessment/Plan:   1. Vitamin D deficiency We will refill Vit D 50,000 IU once a week for 1 month with 0 refills.  -Refill Vitamin D, Ergocalciferol, (DRISDOL) 1.25 MG (50000 UNIT) CAPS capsule; Take one tablet twice monthly  Dispense: 2 capsule; Refill: 0  2. Insulin resistance Information given for IR and Metformin.  3. Obesity with a current BMI of 45.1 We will refill Saxenda 3.0 mg once daily for 1 month with 0 refills.  -Refill Liraglutide -Weight Management (SAXENDA) 18 MG/3ML SOPN; Take 3.0mg  SQ daily.  Dispense: 15 mL; Refill: 0  Susan Dorsey is currently in the action stage of change. As such, her goal is to continue with weight loss efforts. She has agreed to keeping a food journal and adhering to recommended goals of 1500 calories and 90+ grams of protein.   Exercise goals: All  adults should avoid inactivity. Some physical activity is better than none, and adults who participate in any amount of physical activity gain some health benefits.  Behavioral modification strategies: increasing lean protein intake, increasing vegetables, and increasing water intake.  Susan Dorsey has agreed to follow-up with our clinic in 4 weeks. She was informed of the importance of frequent follow-up visits to maximize her success with intensive lifestyle modifications for her multiple health conditions.   Objective:   Blood pressure 138/88, pulse 70, temperature 98 F (36.7 C), height 5\' 6"  (1.676 m), weight 279 lb (126.6 kg), last menstrual period 02/15/2020, SpO2 98 %. Body mass index is 45.03 kg/m.  General: Cooperative, alert, well developed, in no acute distress. HEENT: Conjunctivae and lids unremarkable. Cardiovascular: Regular rhythm.  Lungs: Normal work of breathing. Neurologic: No focal deficits.   Lab Results  Component Value Date   CREATININE 0.85 08/10/2022   BUN 13 08/10/2022   NA 141 08/10/2022   K 4.7 08/10/2022   CL 100 08/10/2022   CO2 25 08/10/2022   Lab Results  Component Value Date   ALT 20 08/10/2022   AST 18 08/10/2022   ALKPHOS 109 08/10/2022   BILITOT 0.4 08/10/2022   Lab Results  Component Value Date   HGBA1C 5.4 02/01/2022   HGBA1C 5.3 07/20/2021   HGBA1C 5.2 07/30/2020   HGBA1C 5.1 09/13/2018   HGBA1C 5.2 01/08/2018   Lab Results  Component  Value Date   INSULIN 5.2 02/01/2022   INSULIN 3.1 07/20/2021   INSULIN 3.3 07/30/2020   INSULIN 5.3 09/13/2018   INSULIN 5.6 01/08/2018   Lab Results  Component Value Date   TSH 3.530 08/10/2022   Lab Results  Component Value Date   CHOL 193 02/01/2022   HDL 56 02/01/2022   LDLCALC 118 (H) 02/01/2022   TRIG 106 02/01/2022   CHOLHDL 3.4 02/01/2022   Lab Results  Component Value Date   VD25OH 61.9 08/10/2022   VD25OH 63.2 02/01/2022   VD25OH 60.3 07/20/2021   Lab Results  Component Value  Date   WBC 6.5 08/10/2022   HGB 14.8 08/10/2022   HCT 44.7 08/10/2022   MCV 94 08/10/2022   PLT 247 08/10/2022   Lab Results  Component Value Date   IRON 76 07/30/2020   TIBC 294 07/30/2020   FERRITIN 71 08/10/2022   Attestation Statements:   Reviewed by clinician on day of visit: allergies, medications, problem list, medical history, surgical history, family history, social history, and previous encounter notes.  Time spent on visit including pre-visit chart review and post-visit care and charting was 30 minutes.   I, Lacresha Fusilier, RMA, am acting as transcriptionist for Irene Limbo, FNP.  I have reviewed the above documentation for accuracy and completeness, and I agree with the above. -  ***

## 2022-09-12 NOTE — Progress Notes (Signed)
Chief Complaint:   OBESITY Susan Dorsey is here to discuss her progress with her obesity treatment plan along with follow-up of her obesity related diagnoses. Susan Dorsey is on keeping a food journal and adhering to recommended goals of 1500 calories and 100 grams of protein and states she is following her eating plan approximately 65% of the time. Susan Dorsey states she is going to gym/walking 60 minutes 3 times per week.  Today's visit was #: 33 Starting weight: 273 lbs Starting date: 07/30/2020 Today's weight: 279 lbs Today's date: 09/08/2022 Total lbs lost to date: 0 lbs Total lbs lost since last in-office visit: 2  Interim History: Susan Dorsey is taking Saxenda 1.8 mg +3 clicks. Notes some side effects of fatigue after increasing dose. Denies redness now at injection site. Took Mounjaro in the past and stopped due to vomiting daily. Some polyphagia in the am. Denies cravings. Drinking coffee, diet soda, water and flavored water.  Subjective:   1. Vitamin D deficiency Pablo is currently taking prescription Vit D 50,000 IU once every 2 weeks.   2. Elevated vitamin B12 level Susan Dorsey is taking a bariatric vitamin.  Assessment/Plan:   1. Vitamin D deficiency We will refill Vit D 50,000 IU once every 2 weeks for 1 month with 0 refills. Low Vitamin D level contributes to fatigue and are associated with obesity, breast, and colon cancer. She agrees to continue to take prescription Vitamin D @50 ,000 IU every week and will follow-up for routine testing of Vitamin D, at least 2-3 times per year to avoid over-replacement.   -Refill Vitamin D, Ergocalciferol, (DRISDOL) 1.25 MG (50000 UNIT) CAPS capsule; Take one tablet twice monthly  Dispense: 2 capsule; Refill: 0  2. Elevated vitamin B12 level The diagnosis was reviewed with the patient. Counseling provided today, see below. We will continue to monitor. Orders and follow up as documented in patient record.  3. Obesity with a current BMI of 45.1 We will refill Saxenda 3.0  mg daily for 1 month with 0 refills.  -Refill Liraglutide -Weight Management (SAXENDA) 18 MG/3ML SOPN; Take 3.0mg  SQ daily.  Dispense: 15 mL; Refill: 0  Susan Dorsey is currently in the action stage of change. As such, her goal is to continue with weight loss efforts. She has agreed to keeping a food journal and adhering to recommended goals of 1500 calories and 100 grams of protein.   Exercise goals: As is.  Behavioral modification strategies: increasing lean protein intake, increasing vegetables, and increasing water intake.  Susan Dorsey has agreed to follow-up with our clinic in 4 weeks. She was informed of the importance of frequent follow-up visits to maximize her success with intensive lifestyle modifications for her multiple health conditions.   Objective:   Blood pressure 138/88, pulse 70, temperature 98 F (36.7 C), height 5\' 6"  (1.676 m), weight 279 lb (126.6 kg), last menstrual period 02/15/2020, SpO2 98 %. Body mass index is 45.03 kg/m.  General: Cooperative, alert, well developed, in no acute distress. HEENT: Conjunctivae and lids unremarkable. Cardiovascular: Regular rhythm.  Lungs: Normal work of breathing. Neurologic: No focal deficits.   Lab Results  Component Value Date   CREATININE 0.85 08/10/2022   BUN 13 08/10/2022   NA 141 08/10/2022   K 4.7 08/10/2022   CL 100 08/10/2022   CO2 25 08/10/2022   Lab Results  Component Value Date   ALT 20 08/10/2022   AST 18 08/10/2022   ALKPHOS 109 08/10/2022   BILITOT 0.4 08/10/2022   Lab Results  Component  Value Date   HGBA1C 5.4 02/01/2022   HGBA1C 5.3 07/20/2021   HGBA1C 5.2 07/30/2020   HGBA1C 5.1 09/13/2018   HGBA1C 5.2 01/08/2018   Lab Results  Component Value Date   INSULIN 5.2 02/01/2022   INSULIN 3.1 07/20/2021   INSULIN 3.3 07/30/2020   INSULIN 5.3 09/13/2018   INSULIN 5.6 01/08/2018   Lab Results  Component Value Date   TSH 3.530 08/10/2022   Lab Results  Component Value Date   CHOL 193 02/01/2022   HDL  56 02/01/2022   LDLCALC 118 (H) 02/01/2022   TRIG 106 02/01/2022   CHOLHDL 3.4 02/01/2022   Lab Results  Component Value Date   VD25OH 61.9 08/10/2022   VD25OH 63.2 02/01/2022   VD25OH 60.3 07/20/2021   Lab Results  Component Value Date   WBC 6.5 08/10/2022   HGB 14.8 08/10/2022   HCT 44.7 08/10/2022   MCV 94 08/10/2022   PLT 247 08/10/2022   Lab Results  Component Value Date   IRON 76 07/30/2020   TIBC 294 07/30/2020   FERRITIN 71 08/10/2022   Attestation Statements:   Reviewed by clinician on day of visit: allergies, medications, problem list, medical history, surgical history, family history, social history, and previous encounter notes.  I, Brendell Tyus, RMA, am acting as transcriptionist for Everardo Pacific, FNP.  I have reviewed the above documentation for accuracy and completeness, and I agree with the above. Everardo Pacific, FNP

## 2022-09-20 ENCOUNTER — Telehealth (INDEPENDENT_AMBULATORY_CARE_PROVIDER_SITE_OTHER): Payer: Self-pay | Admitting: Nurse Practitioner

## 2022-09-20 ENCOUNTER — Encounter (INDEPENDENT_AMBULATORY_CARE_PROVIDER_SITE_OTHER): Payer: Self-pay

## 2022-09-20 NOTE — Telephone Encounter (Signed)
Susan Dorsey - Prior authorization approved for Saxenda. Effective: 09/20/2022-09/21/2023. Patient sent approval message via mychart.

## 2022-10-06 ENCOUNTER — Ambulatory Visit: Payer: 59 | Admitting: Nurse Practitioner

## 2022-10-06 ENCOUNTER — Encounter: Payer: Self-pay | Admitting: Nurse Practitioner

## 2022-10-06 VITALS — BP 144/92 | HR 69 | Temp 98.3°F | Ht 66.0 in | Wt 275.0 lb

## 2022-10-06 DIAGNOSIS — E669 Obesity, unspecified: Secondary | ICD-10-CM | POA: Diagnosis not present

## 2022-10-06 DIAGNOSIS — R030 Elevated blood-pressure reading, without diagnosis of hypertension: Secondary | ICD-10-CM | POA: Diagnosis not present

## 2022-10-06 DIAGNOSIS — E559 Vitamin D deficiency, unspecified: Secondary | ICD-10-CM

## 2022-10-06 DIAGNOSIS — Z6841 Body Mass Index (BMI) 40.0 and over, adult: Secondary | ICD-10-CM | POA: Diagnosis not present

## 2022-10-06 MED ORDER — VITAMIN D (ERGOCALCIFEROL) 1.25 MG (50000 UNIT) PO CAPS: ORAL_CAPSULE | ORAL | 0 refills | Status: DC

## 2022-10-06 MED ORDER — SAXENDA 18 MG/3ML ~~LOC~~ SOPN: PEN_INJECTOR | SUBCUTANEOUS | 0 refills | Status: DC

## 2022-10-06 NOTE — Patient Instructions (Signed)
Protein Shakes  What is a protein shake? Protein shakes are usually made with a protein source (protein powder, yogurt, etc.) frozen fruit, milk, water and/or ice.  A protein shake can be a healthy way to get your protein if you miss a meal.    When to drink a protein shake? Protein shakes can be beneficial if you decide to not eat a meal or are unable to eat for whatever reason.  What are the benefits of a protein shake? Protein shakes are an excellent way to get an adequate protein for anyone but especially for persons who are vegetarians or vegan.  Additionally, protein shakes are great to drink after a workout for muscle recovery.  What are the best protein powders?         Whey protein Grow Whey Protein Isolate [employer] Whey Protein X Werks Grow Whey Protein Isolate  Plant Based [employer] manager Labs Rice and Pea Powder  However just basic protein powder, either whey or plant based, is fine and it's usually less expensive.  Many protein powders can be bought locally at [employer] or another grocery store.   Prepared Protein Shakes Atkins Milk Chocolate Delight  Cal 160  Total fat 9gm  Total carb 7gm  Protein 15gm Ensure Original Vanilla  Cal 220  Total fat 6gm  Total carb 32gm  Protein 9gm [employer] Signature Complete Nutrition Shake Cal 200 Total fat 6gm Total carb 22gm Protein 15g Orgain Sweet Vanilla Bean Cal 250  Total fat 7gm  Total carb 32gm  Protein 16gm Premier Protein Chocolate  Cal 160  Total fat 3gm  Total carb 5gm  Protein 30gm Premier Protein Vanilla  Cal 160  Total fat 3gm  Total carb 5gm  Protein 30gm Quest Protein Shake Chocolate  Cal 160  Total fat 3.5gm  Total carb 4gm  Protein 30gm  Quest Protein Shake Vanilla  Cal 160  Total fat 3gm  Total carb 3gm  Protein 30gm  Lactose free options Muscle Milk Genuine Chocolate  Cal 160  Total fat 5gm  Total carb 9gm  Protein 25gm Muscle Milk Genuine Vanilla   Cal 160  Total fat 5gm  Total carb 9gm  Protein 25gm Fairlife Chocolate  Cal 150-250  Total fat 2.5-8gm  Total carb 4-22gm  Protein 23-30gm    Fairlife Vanilla  Cal 150  Total fat 2.5gm  Total carb 3gm  Protein 30gm    Core Power  Chocolate or Vanilla   Cal 170  Total fat 4.5gm  Total carb 8gm  Protein 26/41gm   Ideas for Protein Shakes   High protein vegetables:  Spinach or Kale- 1/4-1/2 cup Fruit:  Fresh or frozen fruit- 1/2 cup  Protein Sources:   Greek yogurt Cottage cheese Fairlife milk Kefir  Nuts: Almonds- 2 Tbsp Peanuts- 2 Tbsp Cashews- 2 Tbsp Nut butter- 1 Tbsp  Seeds: Chia seeds- 1 Tbsp Flax seeds- 1 Tbsp Hemp seeds- 1 Tbsp     Basic Protein Shake Recipe 1 cup of frozen fruit 1/2 - 1 cup (Fairlife milk, yogurt or unsweetened almond milk) 1 scoop of protein powder (20-30 grams of protein per scoop) Optional:  artificial sweeteners (to taste), ice, water, and other ingredients listed on this sheet.  Breakfast Protein Shake 1 1/2 cup Fairlife Skim Milk 1 1/2 cup Ice 1 Greek yogurt 1/2 cup frozen berries 4-5 packets Splenda (to your taste) 40 oz cup Around 300 calories 32 grams of protein  Enjoy!

## 2022-10-10 ENCOUNTER — Other Ambulatory Visit: Payer: Self-pay | Admitting: Nurse Practitioner

## 2022-10-10 DIAGNOSIS — Z6841 Body Mass Index (BMI) 40.0 and over, adult: Secondary | ICD-10-CM

## 2022-10-11 ENCOUNTER — Other Ambulatory Visit: Payer: Self-pay

## 2022-10-11 MED ORDER — SAXENDA 18 MG/3ML ~~LOC~~ SOPN
3.0000 mg | PEN_INJECTOR | Freq: Every day | SUBCUTANEOUS | 0 refills | Status: DC
  Filled 2022-10-18: qty 15, 30d supply, fill #0

## 2022-10-11 NOTE — Telephone Encounter (Signed)
Refill resent to [employer] out of stock at [place] per pt.

## 2022-10-11 NOTE — Progress Notes (Signed)
Chief Complaint:   OBESITY Susan Dorsey is here to discuss her progress with her obesity treatment plan along with follow-up of her obesity related diagnoses. [name] is on keeping a food journal and adhering to recommended goals of 1400 calories and ? protein and states she is following her eating plan approximately 50% of the time. [name] states she is working out at the gym 60 minutes 3 times per week.  Today's visit was #: 45 Starting weight: 273 lbs Starting date: 07/30/2020 Today's weight: 275 lbs Today's date: 10/06/2022 Total lbs lost to date: 0 lbs Total lbs lost since last in-office visit: 4  Interim History: Susan Dorsey is taking Saxenda 2.4 mg. She took [facility] in the past and stopped due to side effects of vomiting daily. Since her last visit she has had covid and has some stress at home. She denies stress eating. Meeting calorie goals and working on increasing protein. She is drinking coffee, water and diet sodas.  Subjective:   1. Vitamin D deficiency [name] is currently taking prescription Vit D 50,000 IU once every two weeks. Denies any nausea, vomiting or muscle weakness.   2. Elevated blood pressure reading [name]'s blood pressure is up today. Blood pressure reported at home was 135/82. Taking over the counter medication. Feels elevated due to stress and not feeling well.  Assessment/Plan:   1. Vitamin D deficiency We will refill Vit D 50,000 IU once every two weeks for 1 month with 0 refills.  Low Vitamin D level contributes to fatigue and are associated with obesity, breast, and colon cancer. She agrees to continue to take prescription Vitamin D @50 ,000 IU every 2 weeks and will follow-up for routine testing of Vitamin D, at least 2-3 times per year to avoid over-replacement.   -Refill Vitamin D, Ergocalciferol, (DRISDOL) 1.25 MG (50000 UNIT) CAPS capsule; Take one tablet twice monthly  Dispense: 2 capsule; Refill: 0  2. Elevated blood pressure reading Will continue to monitor and  review with PCP  3. Obesity with a current BMI of 44.1 Susan Dorsey is currently in the action stage of change. As such, her goal is to continue with weight loss efforts. She has agreed to keeping a food journal and adhering to recommended goals of 1500 calories and 85+ grams of protein.   Exercise goals: As is.  Behavioral modification strategies: increasing water intake, meal planning and cooking strategies, and planning for success.  [name] has agreed to follow-up with our clinic in 4 weeks. She was informed of the importance of frequent follow-up visits to maximize her success with intensive lifestyle modifications for her multiple health conditions.   Objective:   Blood pressure (!) 144/92, pulse 69, temperature 98.3 F (36.8 C), temperature source Oral, height 5\' 6"  (1.676 m), weight 275 lb (124.7 kg), last menstrual period 02/15/2020, SpO2 97 %. Body mass index is 44.39 kg/m.  General: Cooperative, alert, well developed, in no acute distress. HEENT: Conjunctivae and lids unremarkable. Cardiovascular: Regular rhythm.  Lungs: Normal work of breathing. Neurologic: No focal deficits.   Lab Results  Component Value Date   CREATININE 0.85 08/10/2022   BUN 13 08/10/2022   NA 141 08/10/2022   K 4.7 08/10/2022   CL 100 08/10/2022   CO2 25 08/10/2022   Lab Results  Component Value Date   ALT 20 08/10/2022   AST 18 08/10/2022   ALKPHOS 109 08/10/2022   BILITOT 0.4 08/10/2022   Lab Results  Component Value Date   HGBA1C 5.4 02/01/2022  HGBA1C 5.3 07/20/2021   HGBA1C 5.2 07/30/2020   HGBA1C 5.1 09/13/2018   HGBA1C 5.2 01/08/2018   Lab Results  Component Value Date   INSULIN 5.2 02/01/2022   INSULIN 3.1 07/20/2021   INSULIN 3.3 07/30/2020   INSULIN 5.3 09/13/2018   INSULIN 5.6 01/08/2018   Lab Results  Component Value Date   TSH 3.530 08/10/2022   Lab Results  Component Value Date   CHOL 193 02/01/2022   HDL 56 02/01/2022   LDLCALC 118 (H) 02/01/2022   TRIG 106  02/01/2022   CHOLHDL 3.4 02/01/2022   Lab Results  Component Value Date   VD25OH 61.9 08/10/2022   VD25OH 63.2 02/01/2022   VD25OH 60.3 07/20/2021   Lab Results  Component Value Date   WBC 6.5 08/10/2022   HGB 14.8 08/10/2022   HCT 44.7 08/10/2022   MCV 94 08/10/2022   PLT 247 08/10/2022   Lab Results  Component Value Date   IRON 76 07/30/2020   TIBC 294 07/30/2020   FERRITIN 71 08/10/2022   Attestation Statements:   Reviewed by clinician on day of visit: allergies, medications, problem list, medical history, surgical history, family history, social history, and previous encounter notes.  I, Brendell Tyus, RMA, am acting as transcriptionist for Everardo Pacific, FNP.  I have reviewed the above documentation for accuracy and completeness, and I agree with the above. Everardo Pacific, FNP

## 2022-10-18 ENCOUNTER — Other Ambulatory Visit (HOSPITAL_COMMUNITY): Payer: Self-pay

## 2022-11-10 ENCOUNTER — Encounter: Payer: Self-pay | Admitting: Nurse Practitioner

## 2022-11-10 ENCOUNTER — Ambulatory Visit: Payer: 59 | Admitting: Nurse Practitioner

## 2022-11-10 ENCOUNTER — Other Ambulatory Visit (HOSPITAL_COMMUNITY): Payer: Self-pay

## 2022-11-10 VITALS — BP 127/91 | HR 66 | Temp 98.4°F | Ht 66.0 in | Wt 276.0 lb

## 2022-11-10 DIAGNOSIS — Z6841 Body Mass Index (BMI) 40.0 and over, adult: Secondary | ICD-10-CM | POA: Diagnosis not present

## 2022-11-10 DIAGNOSIS — E669 Obesity, unspecified: Secondary | ICD-10-CM | POA: Diagnosis not present

## 2022-11-10 DIAGNOSIS — E559 Vitamin D deficiency, unspecified: Secondary | ICD-10-CM

## 2022-11-10 MED ORDER — SAXENDA 18 MG/3ML ~~LOC~~ SOPN
3.0000 mg | PEN_INJECTOR | Freq: Every day | SUBCUTANEOUS | 0 refills | Status: DC
  Filled 2022-11-10: qty 15, 30d supply, fill #0
  Filled 2022-12-12: qty 15, 30d supply, fill #1

## 2022-11-10 MED ORDER — VITAMIN D (ERGOCALCIFEROL) 1.25 MG (50000 UNIT) PO CAPS: ORAL_CAPSULE | ORAL | 0 refills | Status: DC

## 2022-11-14 NOTE — Progress Notes (Unsigned)
Chief Complaint:   OBESITY Susan Dorsey is here to discuss her progress with her obesity treatment plan along with follow-up of her obesity related diagnoses. Susan Dorsey is on keeping a food journal and adhering to recommended goals of 1500 calories and 85 grams of protein and states she is following her eating plan approximately 50% of the time. Susan Dorsey states she is going to gym with trainer/walking 30 minutes 3 times per week.  Today's visit was #: 35 Starting weight: 273 lbs Starting date: 07/30/2022 Today's weight: 276 lbs Today's date: 11/10/2022 Total lbs lost to date: 0 lbs Total lbs lost since last in-office visit: 0  Interim History: Susan Dorsey has been sick over the past month. Taking Saxenda 2.4 mg +4 clicks. Everytime increases dose, notes side effects of diarrhea. Plans to increase dose again tonight. Helps her to be mindful of food choices. Has not been tracking but plans to start back this week after Thanksgiving.  Subjective:   1. Vitamin D deficiency Susan Dorsey is currently taking prescription Vit D 50,000 IU once every two weeks. Denies any nausea, vomiting or muscle weakness.  Assessment/Plan:   1. Vitamin D deficiency We will refill Vit D 50,000 IU once every 2 weeks for 1 month with 0 refills. Low Vitamin D level contributes to fatigue and are associated with obesity, breast, and colon cancer. She agrees to continue to take prescription Vitamin D @50 ,000 IU every week and will follow-up for routine testing of Vitamin D, at least 2-3 times per year to avoid over-replacement.   -Refill Vitamin D, Ergocalciferol, (DRISDOL) 1.25 MG (50000 UNIT) CAPS capsule; Take one tablet twice monthly  Dispense: 2 capsule; Refill: 0  2. Obesity with a current BMI of 44.6 We will refill Saxenda 3 mg SQ daily for 1 month with 0 refills.  Side effects discussed.   -Refill Liraglutide -Weight Management (SAXENDA) 18 MG/3ML SOPN; Inject 3 mg into the skin daily.  Dispense: 45 mL; Refill: 0  Susan Dorsey is currently in  the action stage of change. As such, her goal is to continue with weight loss efforts. She has agreed to keeping a food journal and adhering to recommended goals of 1500 calories and 100+ grams of protein.   Exercise goals: As is.  Behavioral modification strategies: increasing lean protein intake, increasing water intake, and holiday eating strategies .  Susan Dorsey has agreed to follow-up with our clinic in 10 weeks. She was informed of the importance of frequent follow-up visits to maximize her success with intensive lifestyle modifications for her multiple health conditions.   Objective:   Blood pressure (!) 127/91, pulse 66, temperature 98.4 F (36.9 C), temperature source Oral, height 5\' 6"  (1.676 m), weight 276 lb (125.2 kg), last menstrual period 02/15/2020, SpO2 99 %. Body mass index is 44.55 kg/m.  General: Cooperative, alert, well developed, in no acute distress. HEENT: Conjunctivae and lids unremarkable. Cardiovascular: Regular rhythm.  Lungs: Normal work of breathing. Neurologic: No focal deficits.   Lab Results  Component Value Date   CREATININE 0.85 08/10/2022   BUN 13 08/10/2022   NA 141 08/10/2022   K 4.7 08/10/2022   CL 100 08/10/2022   CO2 25 08/10/2022   Lab Results  Component Value Date   ALT 20 08/10/2022   AST 18 08/10/2022   ALKPHOS 109 08/10/2022   BILITOT 0.4 08/10/2022   Lab Results  Component Value Date   HGBA1C 5.4 02/01/2022   HGBA1C 5.3 07/20/2021   HGBA1C 5.2 07/30/2020   HGBA1C 5.1  09/13/2018   HGBA1C 5.2 01/08/2018   Lab Results  Component Value Date   INSULIN 5.2 02/01/2022   INSULIN 3.1 07/20/2021   INSULIN 3.3 07/30/2020   INSULIN 5.3 09/13/2018   INSULIN 5.6 01/08/2018   Lab Results  Component Value Date   TSH 3.530 08/10/2022   Lab Results  Component Value Date   CHOL 193 02/01/2022   HDL 56 02/01/2022   LDLCALC 118 (H) 02/01/2022   TRIG 106 02/01/2022   CHOLHDL 3.4 02/01/2022   Lab Results  Component Value Date    VD25OH 61.9 08/10/2022   VD25OH 63.2 02/01/2022   VD25OH 60.3 07/20/2021   Lab Results  Component Value Date   WBC 6.5 08/10/2022   HGB 14.8 08/10/2022   HCT 44.7 08/10/2022   MCV 94 08/10/2022   PLT 247 08/10/2022   Lab Results  Component Value Date   IRON 76 07/30/2020   TIBC 294 07/30/2020   FERRITIN 71 08/10/2022    Attestation Statements:   Reviewed by clinician on day of visit: allergies, medications, problem list, medical history, surgical history, family history, social history, and previous encounter notes.  I, Brendell Tyus, RMA, am acting as transcriptionist for Irene Limbo, FNP.  I have reviewed the above documentation for accuracy and completeness, and I agree with the above. Irene Limbo, FNP

## 2023-01-05 ENCOUNTER — Encounter: Payer: Self-pay | Admitting: Nurse Practitioner

## 2023-01-05 ENCOUNTER — Ambulatory Visit: Payer: 59 | Admitting: Nurse Practitioner

## 2023-01-05 VITALS — BP 144/93 | HR 65 | Temp 97.8°F | Ht 66.0 in | Wt 281.0 lb

## 2023-01-05 DIAGNOSIS — E669 Obesity, unspecified: Secondary | ICD-10-CM | POA: Diagnosis not present

## 2023-01-05 DIAGNOSIS — Z6841 Body Mass Index (BMI) 40.0 and over, adult: Secondary | ICD-10-CM | POA: Diagnosis not present

## 2023-01-05 DIAGNOSIS — E559 Vitamin D deficiency, unspecified: Secondary | ICD-10-CM

## 2023-01-05 MED ORDER — VITAMIN D (ERGOCALCIFEROL) 1.25 MG (50000 UNIT) PO CAPS: ORAL_CAPSULE | ORAL | 0 refills | Status: DC

## 2023-01-05 MED ORDER — ZEPBOUND 2.5 MG/0.5ML ~~LOC~~ SOAJ: 2.5000 mg | SUBCUTANEOUS | 0 refills | Status: DC

## 2023-01-11 NOTE — Progress Notes (Signed)
Chief Complaint:   OBESITY Susan Dorsey is here to discuss her progress with her obesity treatment plan along with follow-up of her obesity related diagnoses. Susan Dorsey is on keeping a food journal and adhering to recommended goals of 1500 calories and 100+grams of protein and states she is following her eating plan approximately 20% of the time. Susan Dorsey states she is going to the gym/walking 60 minutes 3 times per week.  Today's visit was #: 12 Starting weight: 273 lbs Starting date: 07/30/2022 Today's weight: 281 lbs Today's date: 01/05/2023 Total lbs lost to date: 0 lb Total lbs lost since last in-office visit: 0  Interim History: Susan Dorsey was seen here last on 11/10/22.  She celebrated the holidays and has been caring for her mother-in-law.  Having R cornea surgery in 2 weeks.  She is taking Saxenda 3 mg.  Denies any side effects.  She is eating 6 small meals per day.  Subjective:   1. Vitamin D deficiency Susan Dorsey is currently taking prescription Vit D 50,000 IU every 2 weeks.  Denies any side effects.  Denies nausea, vomiting or muscle weakness.  Assessment/Plan:   1. Vitamin D deficiency We will refill Vit D 50K IU every 2 weeks for 1 month with 0 refills.  Side effects discussed.  (Will hold Vit D 2 weeks prior to surgery).  -Refill Vitamin D, Ergocalciferol, (DRISDOL) 1.25 MG (50000 UNIT) CAPS capsule; Take one tablet twice monthly  Dispense: 2 capsule; Refill: 0  2. Obesity with a current BMI of 45.4 Start Zepbound 2.5 mg SQ once weekly for 1 month with 0 refills.  Will start after surgery.  Side effects discussed. Stop Saxenda.  Contraindications: Patient denies any contraindications listed below.  Pancreatitis (active gallstones) Medullary thyroid cancer High triglycerides (>500)-will need labs prior to starting Multiple Endocrine Neoplasia syndrome type 2 (MEN 2) Trying to get pregnant Breastfeeding Use with caution with taking insulin or sulfonylureas (will need to monitor blood sugars  for hypoglycemia)  -Start tirzepatide (ZEPBOUND) 2.5 MG/0.5ML Pen; Inject 2.5 mg into the skin once a week.  Dispense: 2 mL; Refill: 0  Susan Dorsey is currently in the action stage of change. As such, her goal is to continue with weight loss efforts. She has agreed to keeping a food journal and adhering to recommended goals of 1500 calories and 100+ grams of protein, the Susan Dorsey, and the Susan Dorsey.   Will discuss POC at next visit.  We will obtain labs at next visit.  Exercise goals: All adults should avoid inactivity. Some physical activity is better than none, and adults who participate in any amount of physical activity gain some health benefits.  Behavioral modification strategies: increasing lean protein intake, increasing water intake, no skipping meals, and meal planning and cooking strategies.  Susan Dorsey has agreed to follow-up with our clinic in 4 weeks. She was informed of the importance of frequent follow-up visits to maximize her success with intensive lifestyle modifications for her multiple health conditions.   Objective:   Blood pressure (!) 144/93, pulse 65, temperature 97.8 F (36.6 C), height 5\' 6"  (1.676 m), weight 281 lb (127.5 kg), last menstrual period 02/15/2020, SpO2 99 %. Body mass index is 45.35 kg/m.  General: Cooperative, alert, well developed, in no acute distress. HEENT: Conjunctivae and lids unremarkable. Cardiovascular: Regular rhythm.  Lungs: Normal work of breathing. Neurologic: No focal deficits.   Lab Results  Component Value Date   CREATININE 0.85 08/10/2022   BUN 13 08/10/2022   NA 141 08/10/2022  K 4.7 08/10/2022   CL 100 08/10/2022   CO2 25 08/10/2022   Lab Results  Component Value Date   ALT 20 08/10/2022   AST 18 08/10/2022   ALKPHOS 109 08/10/2022   BILITOT 0.4 08/10/2022   Lab Results  Component Value Date   HGBA1C 5.4 02/01/2022   HGBA1C 5.3 07/20/2021   HGBA1C 5.2 07/30/2020   HGBA1C 5.1 09/13/2018   HGBA1C 5.2  01/08/2018   Lab Results  Component Value Date   INSULIN 5.2 02/01/2022   INSULIN 3.1 07/20/2021   INSULIN 3.3 07/30/2020   INSULIN 5.3 09/13/2018   INSULIN 5.6 01/08/2018   Lab Results  Component Value Date   TSH 3.530 08/10/2022   Lab Results  Component Value Date   CHOL 193 02/01/2022   HDL 56 02/01/2022   LDLCALC 118 (H) 02/01/2022   TRIG 106 02/01/2022   CHOLHDL 3.4 02/01/2022   Lab Results  Component Value Date   VD25OH 61.9 08/10/2022   VD25OH 63.2 02/01/2022   VD25OH 60.3 07/20/2021   Lab Results  Component Value Date   WBC 6.5 08/10/2022   HGB 14.8 08/10/2022   HCT 44.7 08/10/2022   MCV 94 08/10/2022   PLT 247 08/10/2022   Lab Results  Component Value Date   IRON 76 07/30/2020   TIBC 294 07/30/2020   FERRITIN 71 08/10/2022   Attestation Statements:   Reviewed by clinician on day of visit: allergies, medications, problem list, medical history, surgical history, family history, social history, and previous encounter notes.  I, Brendell Tyus, RMA, am acting as transcriptionist for Everardo Pacific, FNP.  I have reviewed the above documentation for accuracy and completeness, and I agree with the above. Everardo Pacific, FNP

## 2023-01-12 NOTE — Patient Instructions (Signed)

## 2023-01-25 ENCOUNTER — Other Ambulatory Visit (INDEPENDENT_AMBULATORY_CARE_PROVIDER_SITE_OTHER): Payer: Self-pay | Admitting: Nurse Practitioner

## 2023-02-09 ENCOUNTER — Ambulatory Visit (INDEPENDENT_AMBULATORY_CARE_PROVIDER_SITE_OTHER): Payer: 59 | Admitting: Nurse Practitioner

## 2023-02-09 ENCOUNTER — Other Ambulatory Visit (HOSPITAL_COMMUNITY): Payer: Self-pay

## 2023-02-09 ENCOUNTER — Encounter: Payer: Self-pay | Admitting: Nurse Practitioner

## 2023-02-09 VITALS — BP 136/87 | HR 64 | Temp 97.8°F | Ht 66.0 in | Wt 272.0 lb

## 2023-02-09 DIAGNOSIS — Z6841 Body Mass Index (BMI) 40.0 and over, adult: Secondary | ICD-10-CM | POA: Diagnosis not present

## 2023-02-09 DIAGNOSIS — R0602 Shortness of breath: Secondary | ICD-10-CM

## 2023-02-09 DIAGNOSIS — E559 Vitamin D deficiency, unspecified: Secondary | ICD-10-CM

## 2023-02-09 MED ORDER — SAXENDA 18 MG/3ML ~~LOC~~ SOPN
3.0000 mg | PEN_INJECTOR | Freq: Every day | SUBCUTANEOUS | 0 refills | Status: DC
  Filled 2023-02-09: qty 15, 30d supply, fill #0

## 2023-02-09 MED ORDER — VITAMIN D (ERGOCALCIFEROL) 1.25 MG (50000 UNIT) PO CAPS
50000.0000 [IU] | ORAL_CAPSULE | ORAL | 0 refills | Status: DC
  Filled 2023-02-09: qty 2, 28d supply, fill #0

## 2023-02-09 NOTE — Progress Notes (Signed)
Office: [phone]  /  Fax: [phone]  WEIGHT SUMMARY AND BIOMETRICS  Medical Weight Loss Height: 5' 6"$  (1.676 m) Weight: 272 lb (123.4 kg) Temp: 97.8 F (36.6 C) Pulse Rate: 64 BP: 136/87 SpO2: 99 % Today's Visit #: 37 Weight at Last VIsit: 281lb Weight Lost Since Last Visit: 9lb  Body Fat %: 53.4 % Fat Mass (lbs): 145.4 lbs Muscle Mass (lbs): 120.6 lbs Total Body Water (lbs): 97.6 lbs Visceral Fat Rating : 18 RMR: 1915 Starting Date: 07/30/20 Starting Weight: 273lb Total Weight Loss (lbs): 0 lb (0 kg)    HPI  Chief Complaint: OBESITY  Susan Dorsey is here to discuss her progress with her obesity treatment plan. She is on the keeping a food journal and adhering to recommended goals of 1500 calories and 100+ protein and states she is following her eating plan approximately 75 % of the time. She states she is exercising 45 minutes 3 days per week.   Interval History:  Since last office visit she has lost 9 lbs.  She had a cornea transplant since her lat visit.  She decreased her fruits to 2 pieces per day. She is aiming to eat more protein. She is eating 3 meals per day and is limiting snacking.  She is averaging around 1400 calories and 80 grams of protein per day.  She is drinking water daily.    Pharmacotherapy for weight loss: she is currently taking Saxenda 12m (PA good until 10/14/22)  for medical weight loss.  Denies side effects.  Wasn't able to start Zepbound due to cost.    Previous pharmacotherapy for medical weight loss:   she has tried Orlistat and Mounjaro in the past for medical weight loss.  she stopped Orlistat due to side effects of diarrhea. She stopped Mounjaro due to side effects of vomiting.    Bariatric surgery: She had a Lap Band with Dr. EHedy Camarain 2008 and conversion to Sleeve Gastrectomy in 2020 by Dr.  MHassell Done  Her highest weight prior to surgery was 380 lbs and Her nadir weight after surgery was 211 lbs.  she is taking calcium, MV with iron and Vit D  50,000 IU weekly.  she reports some restriction.    Vit D deficiency  she is taking Vit D 50,000 IU every 2 weeks.  Denies side effects.  Denies nausea, vomiting or muscle weakness.    Shortness of breath with exertion:   Last IC was 1933 on 07/30/20 Today IC was 1915  Lab Results  Component Value Date   VD25OH 61.9 08/10/2022   VD25OH 63.2 02/01/2022   VD25OH 60.3 07/20/2021      PHYSICAL EXAM:  Blood pressure 136/87, pulse 64, temperature 97.8 F (36.6 C), height 5' 6"$  (1.676 m), weight 272 lb (123.4 kg), last menstrual period 02/15/2020, SpO2 99 %. Body mass index is 43.9 kg/m.  General: She is overweight, cooperative, alert, well developed, and in no acute distress. PSYCH: Has normal mood, affect and thought process.   Extremities: No edema.  Neurologic: No gross sensory or motor deficits. No tremors or fasciculations noted.    DIAGNOSTIC DATA REVIEWED:  BMET    Component Value Date/Time   NA 141 08/10/2022 0831   K 4.7 08/10/2022 0831   CL 100 08/10/2022 0831   CO2 25 08/10/2022 0831   GLUCOSE 75 08/10/2022 0831   GLUCOSE 89 02/27/2019 0807   BUN 13 08/10/2022 0831   CREATININE 0.85 08/10/2022 0831   CALCIUM 9.6 08/10/2022 0831   GFRNONAA  76 07/30/2020 1201   GFRAA 88 07/30/2020 1201   Lab Results  Component Value Date   HGBA1C 5.4 02/01/2022   HGBA1C 5.1 07/14/2017   Lab Results  Component Value Date   INSULIN 5.2 02/01/2022   INSULIN 6.0 07/14/2017   Lab Results  Component Value Date   TSH 3.530 08/10/2022   CBC    Component Value Date/Time   WBC 6.5 08/10/2022 0831   WBC 12.7 (H) 03/05/2019 0430   RBC 4.76 08/10/2022 0831   RBC 4.57 03/05/2019 0430   HGB 14.8 08/10/2022 0831   HCT 44.7 08/10/2022 0831   PLT 247 08/10/2022 0831   MCV 94 08/10/2022 0831   MCH 31.1 08/10/2022 0831   MCH 29.3 03/05/2019 0430   MCHC 33.1 08/10/2022 0831   MCHC 31.2 03/05/2019 0430   RDW 12.5 08/10/2022 0831   Iron Studies    Component Value Date/Time    IRON 76 07/30/2020 1201   TIBC 294 07/30/2020 1201   FERRITIN 71 08/10/2022 0831   IRONPCTSAT 26 07/30/2020 1201   Lipid Panel     Component Value Date/Time   CHOL 193 02/01/2022 0751   TRIG 106 02/01/2022 0751   HDL 56 02/01/2022 0751   CHOLHDL 3.4 02/01/2022 0751   LDLCALC 118 (H) 02/01/2022 0751   Hepatic Function Panel     Component Value Date/Time   PROT 6.6 08/10/2022 0831   ALBUMIN 4.2 08/10/2022 0831   AST 18 08/10/2022 0831   ALT 20 08/10/2022 0831   ALKPHOS 109 08/10/2022 0831   BILITOT 0.4 08/10/2022 0831      Component Value Date/Time   TSH 3.530 08/10/2022 0831   Nutritional Lab Results  Component Value Date   VD25OH 61.9 08/10/2022   VD25OH 63.2 02/01/2022   VD25OH 60.3 07/20/2021     ASSESSMENT AND PLAN  TREATMENT PLAN FOR OBESITY:  Recommended Dietary Goals  Yesmin is currently in the action stage of change. As such, her goal is to continue weight management plan. She has agreed to keeping a food journal and adhering to recommended goals of 1500 calories and 90+ protein.  Behavioral Intervention  We discussed the following Behavioral Modification Strategies today: increasing lean protein intake, increasing vegetables, increase water intake, work on meal planning and easy cooking plans, and think about ways to increase physical activity.  Additional resources provided today: NA  Recommended Physical Activity Goals  Sanyah has been advised to work up to 150 minutes of moderate intensity aerobic activity a week and strengthening exercises 2-3 times per week for cardiovascular health, weight loss maintenance and preservation of muscle mass.   She has agreed to increase physical activity in their day and reduce sedentary time (increase NEAT).    Pharmacotherapy We discussed various medication options to help Kymberley with her weight loss efforts and we both agreed to continue Saxenda.  ASSOCIATED CONDITIONS ADDRESSED TODAY  Action/Plan  Vitamin D  deficiency -     Vitamin D (Ergocalciferol); Take 1 capsule (50,000 Units total) by mouth 2 times a month  Dispense: 2 capsule; Refill: 0  Low Vitamin D level contributes to fatigue and are associated with obesity, breast, and colon cancer. She agrees to continue to take prescription Vitamin D @50$ ,000 IU every week and will follow-up for routine testing of Vitamin D, at least 2-3 times per year to avoid over-replacement.   SOB (shortness of breath) [R06.02] IC Slightly worsening.  Encouraged to increase protein intake, water and movement.    Morbid obesity (  [place]) -     Saxenda; Inject 3 mg into the skin daily.  Dispense: 15 mL; Refill: 0.  Side effects discussed.    BMI 40.0-44.9, adult ([place])    Will obtain labs at next visit.    Return in about 4 weeks (around 03/09/2023).Marland Kitchen She was informed of the importance of frequent follow up visits to maximize her success with intensive lifestyle modifications for her multiple health conditions.   ATTESTASTION STATEMENTS:  Reviewed by clinician on day of visit: allergies, medications, problem list, medical history, surgical history, family history, social history, and previous encounter notes.      Ailene Rud. [place] Oborn FNP-C

## 2023-03-16 ENCOUNTER — Encounter: Payer: Self-pay | Admitting: Nurse Practitioner

## 2023-03-16 ENCOUNTER — Ambulatory Visit (INDEPENDENT_AMBULATORY_CARE_PROVIDER_SITE_OTHER): Payer: 59 | Admitting: Nurse Practitioner

## 2023-03-16 ENCOUNTER — Telehealth: Payer: Self-pay

## 2023-03-16 VITALS — BP 140/85 | HR 65 | Temp 97.5°F | Ht 66.0 in | Wt 270.0 lb

## 2023-03-16 DIAGNOSIS — E559 Vitamin D deficiency, unspecified: Secondary | ICD-10-CM | POA: Diagnosis not present

## 2023-03-16 DIAGNOSIS — Z6841 Body Mass Index (BMI) 40.0 and over, adult: Secondary | ICD-10-CM | POA: Diagnosis not present

## 2023-03-16 MED ORDER — WEGOVY 1.7 MG/0.75ML ~~LOC~~ SOAJ: 1.7000 mg | SUBCUTANEOUS | 0 refills | Status: DC

## 2023-03-16 MED ORDER — VITAMIN D (ERGOCALCIFEROL) 1.25 MG (50000 UNIT) PO CAPS: 50000.0000 [IU] | ORAL_CAPSULE | ORAL | 0 refills | Status: DC

## 2023-03-16 NOTE — Telephone Encounter (Signed)
PA submitted through Cover My Meds for [facility]. Awaiting insurance determination.  Key: Susan Dorsey

## 2023-03-16 NOTE — Progress Notes (Addendum)
Office: [phone]  /  Fax: [phone]  WEIGHT SUMMARY AND BIOMETRICS  Weight Lost Since Last Visit: 2lb  No data recorded  Vitals Temp: (!) 97.5 F (36.4 C) BP: (!) 140/85 Pulse Rate: 65 SpO2: 99 %   Anthropometric Measurements Height: 5\' 6"  (1.676 m) Weight: 270 lb (122.5 kg) BMI (Calculated): 43.6 Weight at Last Visit: 272lb Weight Lost Since Last Visit: 2lb Starting Weight: 273lb Total Weight Loss (lbs): 3 lb (1.361 kg)   Body Composition  Body Fat %: 52.8 % Fat Mass (lbs): 142.6 lbs Muscle Mass (lbs): 121 lbs Total Body Water (lbs): 96.8 lbs Visceral Fat Rating : 18   Other Clinical Data Today's Visit #: 44 Starting Date: 07/30/20     HPI  Chief Complaint: OBESITY  Susan Dorsey is here to discuss her progress with her obesity treatment plan. She is on the keeping a food journal and adhering to recommended goals of 1500 calories and 100+ protein and states she is following her eating plan approximately 40 % of the time. She states she is exercising 60 minutes 2 days per week.   Interval History:  Since last office visit she has lost 2 pounds.  She is surprised she lost weight.  Her house is under renovations, her mother in law had hip surgery and her dog passed away. She is trying to eat more protein. She is averaging around 60-70 grams of protein.    Pharmacotherapy for weight loss: She is currently taking Saxenda 3mg  for medical weight loss.  Denies side effects.  Requesting to start [facility].    Previous pharmacotherapy for medical weight loss:   She has tried Orlistat and Mounjaro in the past for medical weight loss.  She stopped Orlistat due to side effects of diarrhea. She stopped Mounjaro due to side effects of vomiting and diarrhea.    Bariatric surgery:  Patient is status post Lap Band with Dr. Hedy Camara in 2008 and conversion to Sleeve Gastrectomy in 2020 by Dr.  Hassell Done.  Her highest weight prior to surgery was 380 lbs and Her nadir weight after surgery  was 211 lbs.  She is taking calcium, MV with iron and Vit D 50,000 IU weekly.  She reports some restriction.    Vit D deficiency  She is taking Vit D 50,000 IU every 2 weeks.  Denies side effects.  Denies nausea, vomiting or muscle weakness.    Lab Results  Component Value Date   VD25OH 61.9 08/10/2022   VD25OH 63.2 02/01/2022   VD25OH 60.3 07/20/2021     PHYSICAL EXAM:  Blood pressure (!) 140/85, pulse 65, temperature (!) 97.5 F (36.4 C), height 5\' 6"  (1.676 m), weight 270 lb (122.5 kg), last menstrual period 02/15/2020, SpO2 99 %. Body mass index is 43.58 kg/m.  General: She is overweight, cooperative, alert, well developed, and in no acute distress. PSYCH: Has normal mood, affect and thought process.   Extremities: No edema.  Neurologic: No gross sensory or motor deficits. No tremors or fasciculations noted.    DIAGNOSTIC DATA REVIEWED:  BMET    Component Value Date/Time   NA 141 08/10/2022 0831   K 4.7 08/10/2022 0831   CL 100 08/10/2022 0831   CO2 25 08/10/2022 0831   GLUCOSE 75 08/10/2022 0831   GLUCOSE 89 02/27/2019 0807   BUN 13 08/10/2022 0831   CREATININE 0.85 08/10/2022 0831   CALCIUM 9.6 08/10/2022 0831   GFRNONAA 76 07/30/2020 1201   GFRAA 88 07/30/2020 1201   Lab Results  Component Value Date   HGBA1C 5.4 02/01/2022   HGBA1C 5.1 07/14/2017   Lab Results  Component Value Date   INSULIN 5.2 02/01/2022   INSULIN 6.0 07/14/2017   Lab Results  Component Value Date   TSH 3.530 08/10/2022   CBC    Component Value Date/Time   WBC 6.5 08/10/2022 0831   WBC 12.7 (H) 03/05/2019 0430   RBC 4.76 08/10/2022 0831   RBC 4.57 03/05/2019 0430   HGB 14.8 08/10/2022 0831   HCT 44.7 08/10/2022 0831   PLT 247 08/10/2022 0831   MCV 94 08/10/2022 0831   MCH 31.1 08/10/2022 0831   MCH 29.3 03/05/2019 0430   MCHC 33.1 08/10/2022 0831   MCHC 31.2 03/05/2019 0430   RDW 12.5 08/10/2022 0831   Iron Studies    Component Value Date/Time   IRON 76 07/30/2020  1201   TIBC 294 07/30/2020 1201   FERRITIN 71 08/10/2022 0831   IRONPCTSAT 26 07/30/2020 1201   Lipid Panel     Component Value Date/Time   CHOL 193 02/01/2022 0751   TRIG 106 02/01/2022 0751   HDL 56 02/01/2022 0751   CHOLHDL 3.4 02/01/2022 0751   LDLCALC 118 (H) 02/01/2022 0751   Hepatic Function Panel     Component Value Date/Time   PROT 6.6 08/10/2022 0831   ALBUMIN 4.2 08/10/2022 0831   AST 18 08/10/2022 0831   ALT 20 08/10/2022 0831   ALKPHOS 109 08/10/2022 0831   BILITOT 0.4 08/10/2022 0831      Component Value Date/Time   TSH 3.530 08/10/2022 0831   Nutritional Lab Results  Component Value Date   VD25OH 61.9 08/10/2022   VD25OH 63.2 02/01/2022   VD25OH 60.3 07/20/2021     ASSESSMENT AND PLAN  TREATMENT PLAN FOR OBESITY:  Recommended Dietary Goals  Susan Dorsey is currently in the action stage of change. As such, her goal is to continue weight management plan. She has agreed to keeping a food journal and adhering to recommended goals of 1500 calories and 80+ protein.  Behavioral Intervention  We discussed the following Behavioral Modification Strategies today: increasing lean protein intake, decreasing simple carbohydrates , increasing vegetables, avoiding skipping meals, increasing water intake, work on meal planning and easy cooking plans, planning for success, and keeping healthy foods at home.  Additional resources provided today: NA  Recommended Physical Activity Goals  Susan Dorsey has been advised to work up to 150 minutes of moderate intensity aerobic activity a week and strengthening exercises 2-3 times per week for cardiovascular health, weight loss maintenance and preservation of muscle mass.   She has agreed to Continue current level of physical activity  and Think about ways to increase physical activity   Pharmacotherapy We discussed various medication options to help Susan Dorsey with her weight loss efforts and we both agreed to finish current dose of Saxenda  and then will start [facility] 1.7mg  weekly after she stops Saxenda.  ASSOCIATED CONDITIONS ADDRESSED TODAY  Action/Plan  Vitamin D deficiency -     Vitamin D (Ergocalciferol); Take 1 capsule (50,000 Units total) by mouth 2 times a month  Dispense: 2 capsule; Refill: 0  Low Vitamin D level contributes to fatigue and are associated with obesity, breast, and colon cancer. She agrees to continue to take prescription Vitamin D @50 ,000 IU every week and will follow-up for routine testing of Vitamin D, at least 2-3 times per year to avoid over-replacement.   Morbid obesity ([place]) -     [id number]; Inject 1.7 mg  into the skin once a week.  Dispense: 3 mL; Refill: 0.  Side effects discussed.   BMI 40.0-44.9, adult ([place]) [id number]; Inject 1.7 mg into the skin once a week.  Dispense: 3 mL; Refill: 0       Will obtain labs at next visit.   Return in about 5 weeks (around 04/20/2023).Marland Kitchen She was informed of the importance of frequent follow up visits to maximize her success with intensive lifestyle modifications for her multiple health conditions.   ATTESTASTION STATEMENTS:  Reviewed by clinician on day of visit: allergies, medications, problem list, medical history, surgical history, family history, social history, and previous encounter notes.     Ailene Rud. Yoniel Arkwright FNP-C

## 2023-03-16 NOTE — Patient Instructions (Signed)
Steps to starting your WegovyT  The office staff will send a prior authorization request to your insurance company for approval. We will send you a mychart message once we hear back from your insurance with a decision.  This can take up to 7-10 business days.   Once your WegovyTis approved, you may then pick up Wegovy pen from your pharmacy.    Learn how to do Wegovy injections on the Wegovy.com website. There is a training video that will walk you through how to safely perform the injection. If you have questions for our clinical staff, please contact our  clinical staff. If you have any symptoms of allergic reaction to WegovyT discontinue immediately and call 911.  1. What should I tell my provider before using WegovyT ? have or have had problems with your pancreas or kidneys. have type 2 diabetes and a history of diabetic retinopathy. have or have had depression, suicidal thoughts, or mental health issues. are pregnant or plan to become pregnant. WegovyT may harm your unborn baby. You should stop using WegovyT 3 months before you plan to become pregnant or if you are breastfeeding or plan to breastfeed. It is not known if WegovyT passes into your breast milk.  2. What is WegovyT and how does it work?  WegovyT is an injectable prescription medication prescribed by your provider to help with your weight loss.  This medicine will be most effective when combined with a reduced calorie diet and physical activity.  WegovyT is not for the treatment of type 2 diabetes mellitus. WegovyT should not be used with other GLP-1 receptor agonist medicines. The addition of WegovyT in  patients treated with insulin has not been evaluated. When initiating WegovyT, consider reducing the dose of concomitantly administered insulin secretagogues (such as sulfonylureas) or insulin to reduce the risk of  hypoglycemia.  One role of GLP-1 is to send a signal to your brain to tell it you are full. It also slows down  stomach emptying which will make you feel full longer and may help with reducing cravings.   3.  How should I take WegovyT?  Administer WegovyT once weekly, on the same day each week, at any time of day, with or without meals Inject subcutaneously in the abdomen, thigh or upper arm Initiate at 0.25 mg once weekly for 4 weeks. In 4 week intervals, increase the dose until a dose of 2.4 mg is reached (we will discuss with you the dosage at each visit). The maintenance dose of WegovyT is 2.4 mg once weekly.  The dosing schedule of Wegovy is:  0.25 mg per week X 4 weeks 0.5 mg per week X 4 weeks 1.0 mg per week X 4 weeks 1.7 mg per week X 4 weeks 2.4 mg per week   Missed dose   If you miss your injection day, go ahead inject your current dose. You can go >7 days, but not <7 days between injections. You may change your injection day (It must be >7 days). If you miss >2 doses, you can still keep next injection dose the same or follow de-escalation schedule which may minimize GI symptoms.   In patients with type 2 diabetes, monitor blood glucose prior to starting and during WEGOVYT treatment.   Inject your dose of Wegovy under the skin (subcutaneous injection) in your stomach area (abdomen), upper leg (thigh) or upper arm. Do not inject into a vein or a muscle. The injection site should be rotated and not given in the   same spot each day. Hold the needle under the skin and count to "10". This will allow all of the medicine to be dispensed under the skin. Always wipe your skin with an alcohol prep pad before injection  Dispose of used pen in an approved sharps container. More practical options that can be put in the trash  to go to the landfill are milk jugs or plastic laundry detergent containers with a screw on lid.  What side effects may I notice from taking WegovyT?  Side effects that usually do not require medical attention (report to our office if they continue or are bothersome): Nausea  (most common but decreases over time in most people as their body gets used to the medicine) Diarrhea Constipation (you may take an over the counter laxative if needed) Headache Decreased appetite Upset stomach Tiredness Dizziness Feeling bloated Hair loss Belching Gas Heartburn  Side effects that you should call 911 as soon as possible Vomiting Stomach pain Fever Yellowing of your skin or eyes  Clay-colored stools Increased heart rate while at rest Low blood sugar  Sudden changes in mood, behaviors, thoughts, feelings, or thoughts of suicide If you get a lump or swelling in your neck, hoarseness, trouble swallowing, or shortness of breath. Allergic reaction such as skin rash, itching, hives, swelling of the face, tongue, or lips  Helpful tips for managing nausea Nausea is a common side effect when first starting WegovyT. If you experience nausea, be sure to connect with your health care provider. He or she will offer guidance on ways to manage it, which may include: Eat bland, low-fat foods, like crackers, toast and rice  Eat foods that contain water, like soups and gelatin  Avoid lying down after you eat  Go outdoors for fresh air  Eat more slowly    Other important information Do not drop your pen or knock it against hard surfaces  Do not expose your pen to any liquids  If you think that your pen may be damaged, do not try to fix it. Use a new one Keep the pen cap on until you are ready to inject. Your pen will no longer be sterile if you store an unused pen without the cap, if you pull the pen cap off and put it on again, or if the pen cap is missing. This could lead to an infection  Store the WegovyT pen in the refrigerator from 36F to 46F (2C to 8C) If needed, before removing the pen cap, WegovyT can be stored from 8C to 30C (46F to 86F) in the original carton for up to 28 days.  Keep WegovyT in the original carton to protect it from light  Do not freeze   Throw away pen if WegovyT has been frozen, has been exposed to light or temperatures above 86F (30C), or has been out of the refrigerator for 28 days or longer It's important to properly dispose of your used WegovyT pens. Do not throw the pen away in your household trash. Instead, use an FDA-cleared sharps disposable container or a sturdy household container with a tight-fitting lid, like a heavy duty plastic container.   Wegovy pen training website: https://www.wegovy.com/about-wegovy/how-to-use-the-wegovy-pen.html  Wegovy savings and support link: https://www.wegovy.com/saving-and-support/save-and-support.html    

## 2023-03-20 NOTE — Telephone Encounter (Signed)
Appeal faxed

## 2023-03-21 ENCOUNTER — Telehealth: Payer: Self-pay

## 2023-03-21 NOTE — Telephone Encounter (Signed)
Appeal letter faxed to [facility] for [facility].

## 2023-03-27 ENCOUNTER — Other Ambulatory Visit (HOSPITAL_COMMUNITY): Payer: Self-pay

## 2023-03-27 ENCOUNTER — Other Ambulatory Visit: Payer: Self-pay | Admitting: Nurse Practitioner

## 2023-03-27 MED ORDER — SAXENDA 18 MG/3ML ~~LOC~~ SOPN
3.0000 mg | PEN_INJECTOR | Freq: Every day | SUBCUTANEOUS | 0 refills | Status: DC
  Filled 2023-03-27: qty 15, 30d supply, fill #0

## 2023-03-27 NOTE — Progress Notes (Signed)
Saxenda refilled 

## 2023-03-28 ENCOUNTER — Other Ambulatory Visit (HOSPITAL_COMMUNITY): Payer: Self-pay

## 2023-04-20 ENCOUNTER — Ambulatory Visit: Payer: 59 | Admitting: Nurse Practitioner

## 2023-04-24 ENCOUNTER — Encounter: Payer: Self-pay | Admitting: Nurse Practitioner

## 2023-04-24 ENCOUNTER — Ambulatory Visit (INDEPENDENT_AMBULATORY_CARE_PROVIDER_SITE_OTHER): Payer: 59 | Admitting: Nurse Practitioner

## 2023-04-24 VITALS — BP 123/83 | HR 62 | Temp 97.8°F | Ht 66.0 in | Wt 271.0 lb

## 2023-04-24 DIAGNOSIS — Z6841 Body Mass Index (BMI) 40.0 and over, adult: Secondary | ICD-10-CM

## 2023-04-24 DIAGNOSIS — E559 Vitamin D deficiency, unspecified: Secondary | ICD-10-CM

## 2023-04-24 MED ORDER — VITAMIN D (ERGOCALCIFEROL) 1.25 MG (50000 UNIT) PO CAPS: 50000.0000 [IU] | ORAL_CAPSULE | ORAL | 0 refills | Status: DC

## 2023-04-24 MED ORDER — WEGOVY 2.4 MG/0.75ML ~~LOC~~ SOAJ: 2.4000 mg | SUBCUTANEOUS | 0 refills | Status: DC

## 2023-04-24 NOTE — Patient Instructions (Signed)
Steps to starting your WegovyT  The office staff will send a prior authorization request to your insurance company for approval. We will send you a mychart message once we hear back from your insurance with a decision.  This can take up to 7-10 business days.   Once your WegovyTis approved, you may then pick up Wegovy pen from your pharmacy.    Learn how to do Wegovy injections on the Wegovy.com website. There is a training video that will walk you through how to safely perform the injection. If you have questions for our clinical staff, please contact our  clinical staff. If you have any symptoms of allergic reaction to WegovyT discontinue immediately and call 911.  1. What should I tell my provider before using WegovyT ? have or have had problems with your pancreas or kidneys. have type 2 diabetes and a history of diabetic retinopathy. have or have had depression, suicidal thoughts, or mental health issues. are pregnant or plan to become pregnant. WegovyT may harm your unborn baby. You should stop using WegovyT 3 months before you plan to become pregnant or if you are breastfeeding or plan to breastfeed. It is not known if WegovyT passes into your breast milk.  2. What is WegovyT and how does it work?  WegovyT is an injectable prescription medication prescribed by your provider to help with your weight loss.  This medicine will be most effective when combined with a reduced calorie diet and physical activity.  WegovyT is not for the treatment of type 2 diabetes mellitus. WegovyT should not be used with other GLP-1 receptor agonist medicines. The addition of WegovyT in  patients treated with insulin has not been evaluated. When initiating WegovyT, consider reducing the dose of concomitantly administered insulin secretagogues (such as sulfonylureas) or insulin to reduce the risk of  hypoglycemia.  One role of GLP-1 is to send a signal to your brain to tell it you are full. It also slows down  stomach emptying which will make you feel full longer and may help with reducing cravings.   3.  How should I take WegovyT?  Administer WegovyT once weekly, on the same day each week, at any time of day, with or without meals Inject subcutaneously in the abdomen, thigh or upper arm Initiate at 0.25 mg once weekly for 4 weeks. In 4 week intervals, increase the dose until a dose of 2.4 mg is reached (we will discuss with you the dosage at each visit). The maintenance dose of WegovyT is 2.4 mg once weekly.  The dosing schedule of Wegovy is:  0.25 mg per week X 4 weeks 0.5 mg per week X 4 weeks 1.0 mg per week X 4 weeks 1.7 mg per week X 4 weeks 2.4 mg per week   Missed dose   If you miss your injection day, go ahead inject your current dose. You can go >7 days, but not <7 days between injections. You may change your injection day (It must be >7 days). If you miss >2 doses, you can still keep next injection dose the same or follow de-escalation schedule which may minimize GI symptoms.   In patients with type 2 diabetes, monitor blood glucose prior to starting and during WEGOVYT treatment.   Inject your dose of Wegovy under the skin (subcutaneous injection) in your stomach area (abdomen), upper leg (thigh) or upper arm. Do not inject into a vein or a muscle. The injection site should be rotated and not given in the   same spot each day. Hold the needle under the skin and count to "10". This will allow all of the medicine to be dispensed under the skin. Always wipe your skin with an alcohol prep pad before injection  Dispose of used pen in an approved sharps container. More practical options that can be put in the trash  to go to the landfill are milk jugs or plastic laundry detergent containers with a screw on lid.  What side effects may I notice from taking WegovyT?  Side effects that usually do not require medical attention (report to our office if they continue or are bothersome): Nausea  (most common but decreases over time in most people as their body gets used to the medicine) Diarrhea Constipation (you may take an over the counter laxative if needed) Headache Decreased appetite Upset stomach Tiredness Dizziness Feeling bloated Hair loss Belching Gas Heartburn  Side effects that you should call 911 as soon as possible Vomiting Stomach pain Fever Yellowing of your skin or eyes  Clay-colored stools Increased heart rate while at rest Low blood sugar  Sudden changes in mood, behaviors, thoughts, feelings, or thoughts of suicide If you get a lump or swelling in your neck, hoarseness, trouble swallowing, or shortness of breath. Allergic reaction such as skin rash, itching, hives, swelling of the face, tongue, or lips  Helpful tips for managing nausea Nausea is a common side effect when first starting WegovyT. If you experience nausea, be sure to connect with your health care provider. He or she will offer guidance on ways to manage it, which may include: Eat bland, low-fat foods, like crackers, toast and rice  Eat foods that contain water, like soups and gelatin  Avoid lying down after you eat  Go outdoors for fresh air  Eat more slowly    Other important information Do not drop your pen or knock it against hard surfaces  Do not expose your pen to any liquids  If you think that your pen may be damaged, do not try to fix it. Use a new one Keep the pen cap on until you are ready to inject. Your pen will no longer be sterile if you store an unused pen without the cap, if you pull the pen cap off and put it on again, or if the pen cap is missing. This could lead to an infection  Store the WegovyT pen in the refrigerator from 36F to 46F (2C to 8C) If needed, before removing the pen cap, WegovyT can be stored from 8C to 30C (46F to 86F) in the original carton for up to 28 days.  Keep WegovyT in the original carton to protect it from light  Do not freeze   Throw away pen if WegovyT has been frozen, has been exposed to light or temperatures above 86F (30C), or has been out of the refrigerator for 28 days or longer It's important to properly dispose of your used WegovyT pens. Do not throw the pen away in your household trash. Instead, use an FDA-cleared sharps disposable container or a sturdy household container with a tight-fitting lid, like a heavy duty plastic container.   Wegovy pen training website: https://www.wegovy.com/about-wegovy/how-to-use-the-wegovy-pen.html  Wegovy savings and support link: https://www.wegovy.com/saving-and-support/save-and-support.html    

## 2023-04-24 NOTE — Progress Notes (Signed)
Office: [phone]  /  Fax: [phone]  WEIGHT SUMMARY AND BIOMETRICS  No data recorded Weight Gained Since Last Visit: 1lb   Vitals Temp: 97.8 F (36.6 C) BP: 123/83 Pulse Rate: 62 SpO2: 100 %   Anthropometric Measurements Height: 5\' 6"  (1.676 m) Weight: 271 lb (122.9 kg) BMI (Calculated): 43.76 Weight at Last Visit: 270lb Weight Gained Since Last Visit: 1lb Starting Weight: 273lb Total Weight Loss (lbs): 2 lb (0.907 kg)   Body Composition  Body Fat %: 53.9 % Fat Mass (lbs): 146.4 lbs Muscle Mass (lbs): 118.8 lbs Visceral Fat Rating : 18   Other Clinical Data Fasting: No Labs: No Today's Visit #: 80 Starting Date: 07/30/20     HPI  Chief Complaint: OBESITY  Susan Dorsey is here to discuss her progress with her obesity treatment plan. She is on the keeping a food journal and adhering to recommended goals of 1500 calories and 100+ protein and states she is following her eating plan approximately 50 % of the time. She states she is exercising 60 minutes 3 days per week.   Interval History:  Since last office visit she has gained 1 pound.  Her house is being renovated.  Her house should be finished next week and she will be able to start cooking more at home and meal prepping.  Plans to start tracking again.  Hasn't been able to exercise as much since having eye surgery. Has been released and doesn't have any exercise limitations now       Pharmacotherapy for weight loss: She is currently taking Wegovy 1.7mg  for medical weight loss.  Denies side effects.  Feels that the Reginal Lutes works better then [employer].    Previous pharmacotherapy for medical weight loss:  Saxenda, Mounjaro-side effects of diarrhea and vomiting.    Bariatric surgery:  status post Lap Band with Dr. Simona Huh in 2008 and conversion to Sleeve Gastrectomy in 2020 by Dr.  Daphine Deutscher.  Her highest weight prior to surgery was 380 lbs and Her nadir weight after surgery was 211 lbs.  She is taking calcium, MV with  iron and Vit D 50,000 IU weekly.  She reports some restriction.    Vit D deficiency  She is taking Vit D 50,000 IU every 2 weeks.  Denies side effects.  Denies nausea, vomiting or muscle weakness.    Lab Results  Component Value Date   VD25OH 61.9 08/10/2022   VD25OH 63.2 02/01/2022   VD25OH 60.3 07/20/2021     PHYSICAL EXAM:  Blood pressure 123/83, pulse 62, temperature 97.8 F (36.6 C), height 5\' 6"  (1.676 m), weight 271 lb (122.9 kg), last menstrual period 02/15/2020, SpO2 100 %. Body mass index is 43.74 kg/m.  General: She is overweight, cooperative, alert, well developed, and in no acute distress. PSYCH: Has normal mood, affect and thought process.   Extremities: No edema.  Neurologic: No gross sensory or motor deficits. No tremors or fasciculations noted.    DIAGNOSTIC DATA REVIEWED:  BMET    Component Value Date/Time   NA 141 08/10/2022 0831   K 4.7 08/10/2022 0831   CL 100 08/10/2022 0831   CO2 25 08/10/2022 0831   GLUCOSE 75 08/10/2022 0831   GLUCOSE 89 02/27/2019 0807   BUN 13 08/10/2022 0831   CREATININE 0.85 08/10/2022 0831   CALCIUM 9.6 08/10/2022 0831   GFRNONAA 76 07/30/2020 1201   GFRAA 88 07/30/2020 1201   Lab Results  Component Value Date   HGBA1C 5.4 02/01/2022   HGBA1C 5.1 07/14/2017  Lab Results  Component Value Date   INSULIN 5.2 02/01/2022   INSULIN 6.0 07/14/2017   Lab Results  Component Value Date   TSH 3.530 08/10/2022   CBC    Component Value Date/Time   WBC 6.5 08/10/2022 0831   WBC 12.7 (H) 03/05/2019 0430   RBC 4.76 08/10/2022 0831   RBC 4.57 03/05/2019 0430   HGB 14.8 08/10/2022 0831   HCT 44.7 08/10/2022 0831   PLT 247 08/10/2022 0831   MCV 94 08/10/2022 0831   MCH 31.1 08/10/2022 0831   MCH 29.3 03/05/2019 0430   MCHC 33.1 08/10/2022 0831   MCHC 31.2 03/05/2019 0430   RDW 12.5 08/10/2022 0831   Iron Studies    Component Value Date/Time   IRON 76 07/30/2020 1201   TIBC 294 07/30/2020 1201   FERRITIN 71  08/10/2022 0831   IRONPCTSAT 26 07/30/2020 1201   Lipid Panel     Component Value Date/Time   CHOL 193 02/01/2022 0751   TRIG 106 02/01/2022 0751   HDL 56 02/01/2022 0751   CHOLHDL 3.4 02/01/2022 0751   LDLCALC 118 (H) 02/01/2022 0751   Hepatic Function Panel     Component Value Date/Time   PROT 6.6 08/10/2022 0831   ALBUMIN 4.2 08/10/2022 0831   AST 18 08/10/2022 0831   ALT 20 08/10/2022 0831   ALKPHOS 109 08/10/2022 0831   BILITOT 0.4 08/10/2022 0831      Component Value Date/Time   TSH 3.530 08/10/2022 0831   Nutritional Lab Results  Component Value Date   VD25OH 61.9 08/10/2022   VD25OH 63.2 02/01/2022   VD25OH 60.3 07/20/2021     ASSESSMENT AND PLAN  TREATMENT PLAN FOR OBESITY:  Recommended Dietary Goals  Susan Dorsey is currently in the action stage of change. As such, her goal is to continue weight management plan. She has agreed to keeping a food journal and adhering to recommended goals of 1500 calories and 100+ protein.  Behavioral Intervention  We discussed the following Behavioral Modification Strategies today: increasing lean protein intake, decreasing simple carbohydrates , increasing vegetables, increasing lower glycemic fruits, increasing fiber rich foods, avoiding skipping meals, increasing water intake, continue to practice mindfulness when eating, and planning for success.  Additional resources provided today: NA  Recommended Physical Activity Goals  Susan Dorsey has been advised to work up to 150 minutes of moderate intensity aerobic activity a week and strengthening exercises 2-3 times per week for cardiovascular health, weight loss maintenance and preservation of muscle mass.   She has agreed to Think about ways to increase physical activity, Work on scheduling and tracking physical activity. , and Unable to participate in physical activity at present due to medical conditions    Pharmacotherapy We discussed various medication options to help Susan Dorsey with her  weight loss efforts and we both agreed to increase Wegovy 2.4mg . Side effects discussed.  ASSOCIATED CONDITIONS ADDRESSED TODAY  Action/Plan  Vitamin D deficiency -     Vitamin D (Ergocalciferol); Take 1 capsule (50,000 Units total) by mouth 2 times a month  Dispense: 2 capsule; Refill: 0  Low Vitamin D level contributes to fatigue and are associated with obesity, breast, and colon cancer. She agrees to continue to take prescription Vitamin D @50 ,000 IU every week and will follow-up for routine testing of Vitamin D, at least 2-3 times per year to avoid over-replacement.   Morbid obesity ([facility]) -     ZOXWRU; Inject 2.4 mg into the skin once a week.  Dispense: 3 mL;  Refill: 0. Side effects discussed.   BMI 40.0-44.9, adult ([facility]) -     GNFAOZ; Inject 2.4 mg into the skin once a week.  Dispense: 3 mL; Refill: 0      Will obtain labs at next visit.    Return in about 4 weeks (around 05/22/2023).Marland Kitchen She was informed of the importance of frequent follow up visits to maximize her success with intensive lifestyle modifications for her multiple health conditions.   ATTESTASTION STATEMENTS:  Reviewed by clinician on day of visit: allergies, medications, problem list, medical history, surgical history, family history, social history, and previous encounter notes.   Theodis Sato. Deidrick Rainey FNP-C

## 2023-05-25 ENCOUNTER — Encounter: Payer: Self-pay | Admitting: Nurse Practitioner

## 2023-05-25 ENCOUNTER — Ambulatory Visit (INDEPENDENT_AMBULATORY_CARE_PROVIDER_SITE_OTHER): Payer: 59 | Admitting: Nurse Practitioner

## 2023-05-25 VITALS — BP 133/85 | HR 66 | Temp 97.5°F | Ht 66.0 in | Wt 269.0 lb

## 2023-05-25 DIAGNOSIS — Z6841 Body Mass Index (BMI) 40.0 and over, adult: Secondary | ICD-10-CM

## 2023-05-25 DIAGNOSIS — E559 Vitamin D deficiency, unspecified: Secondary | ICD-10-CM | POA: Diagnosis not present

## 2023-05-25 MED ORDER — WEGOVY 2.4 MG/0.75ML ~~LOC~~ SOAJ: 2.4000 mg | SUBCUTANEOUS | 0 refills | Status: DC

## 2023-05-25 MED ORDER — VITAMIN D (ERGOCALCIFEROL) 1.25 MG (50000 UNIT) PO CAPS
50000.00 [IU] | ORAL_CAPSULE | ORAL | 0 refills | Status: DC
Start: 2023-05-25 — End: 2023-07-03

## 2023-05-25 NOTE — Progress Notes (Signed)
Office: [phone]  /  Fax: [phone]  WEIGHT SUMMARY AND BIOMETRICS  Weight Lost Since Last Visit: 2lb  No data recorded  Vitals Temp: (!) 97.5 F (36.4 C) BP: 133/85 Pulse Rate: 66 SpO2: 97 %   Anthropometric Measurements Height: 5\' 6"  (1.676 m) Weight: 269 lb (122 kg) BMI (Calculated): 43.44 Weight at Last Visit: 271lb Weight Lost Since Last Visit: 2lb Starting Weight: 273lb Total Weight Loss (lbs): 4 lb (1.814 kg)   Body Composition  Body Fat %: 41.8 % Fat Mass (lbs): 112.4 lbs Muscle Mass (lbs): 148.8 lbs Total Body Water (lbs): 103.2 lbs Visceral Fat Rating : 14   Other Clinical Data Fasting: No Labs: No Today's Visit #: 40 Starting Date: 07/30/20     HPI  Chief Complaint: OBESITY  Island is here to discuss her progress with her obesity treatment plan. She is on the keeping a food journal and adhering to recommended goals of 1500 calories and 100 protein and states she is following her eating plan approximately 75 % of the time. She states she is exercising 90 minutes 3 days per week.   Interval History:  Since last office visit she has lost 2 pounds.  She has had a few celebrations since her last visit.  She is averaging around 1500 calories and 100 grams of protein.  She was very excited that she didn't gain weight this time of the year.  She tends to gain 5-10 pounds around her birthday time.     Pharmacotherapy for weight loss: She is currently taking Wegovy 2.4 for medical weight loss.  Denies side effects.    Previous pharmacotherapy for medical weight loss:  Saxenda, Mounjaro-side effects of diarrhea and vomiting.    Bariatric surgery:  Patient is status post Lap Band with Dr. Simona Huh in 2008 and conversion to Sleeve Gastrectomy in 2020 by Dr. Daphine Deutscher. Her highest weight prior to surgery was 380 lbs and Her nadir weight after surgery was 211 lbs. She is taking calcium, MV with iron and Vit D 50,000 IU every 2 weeks. She reports some  restriction.   Vit D deficiency  She is taking Vit D 50,000 IU every 2 weeks.  Denies side effects.  Denies nausea, vomiting or muscle weakness.    Lab Results  Component Value Date   VD25OH 61.9 08/10/2022   VD25OH 63.2 02/01/2022   VD25OH 60.3 07/20/2021     PHYSICAL EXAM:  Blood pressure 133/85, pulse 66, temperature (!) 97.5 F (36.4 C), height 5\' 6"  (1.676 m), weight 269 lb (122 kg), last menstrual period 02/15/2020, SpO2 97 %. Body mass index is 43.42 kg/m.  General: She is overweight, cooperative, alert, well developed, and in no acute distress. PSYCH: Has normal mood, affect and thought process.   Extremities: No edema.  Neurologic: No gross sensory or motor deficits. No tremors or fasciculations noted.    DIAGNOSTIC DATA REVIEWED:  BMET    Component Value Date/Time   NA 141 08/10/2022 0831   K 4.7 08/10/2022 0831   CL 100 08/10/2022 0831   CO2 25 08/10/2022 0831   GLUCOSE 75 08/10/2022 0831   GLUCOSE 89 02/27/2019 0807   BUN 13 08/10/2022 0831   CREATININE 0.85 08/10/2022 0831   CALCIUM 9.6 08/10/2022 0831   GFRNONAA 76 07/30/2020 1201   GFRAA 88 07/30/2020 1201   Lab Results  Component Value Date   HGBA1C 5.4 02/01/2022   HGBA1C 5.1 07/14/2017   Lab Results  Component Value Date   INSULIN  5.2 02/01/2022   INSULIN 6.0 07/14/2017   Lab Results  Component Value Date   TSH 3.530 08/10/2022   CBC    Component Value Date/Time   WBC 6.5 08/10/2022 0831   WBC 12.7 (H) 03/05/2019 0430   RBC 4.76 08/10/2022 0831   RBC 4.57 03/05/2019 0430   HGB 14.8 08/10/2022 0831   HCT 44.7 08/10/2022 0831   PLT 247 08/10/2022 0831   MCV 94 08/10/2022 0831   MCH 31.1 08/10/2022 0831   MCH 29.3 03/05/2019 0430   MCHC 33.1 08/10/2022 0831   MCHC 31.2 03/05/2019 0430   RDW 12.5 08/10/2022 0831   Iron Studies    Component Value Date/Time   IRON 76 07/30/2020 1201   TIBC 294 07/30/2020 1201   FERRITIN 71 08/10/2022 0831   IRONPCTSAT 26 07/30/2020 1201    Lipid Panel     Component Value Date/Time   CHOL 193 02/01/2022 0751   TRIG 106 02/01/2022 0751   HDL 56 02/01/2022 0751   CHOLHDL 3.4 02/01/2022 0751   LDLCALC 118 (H) 02/01/2022 0751   Hepatic Function Panel     Component Value Date/Time   PROT 6.6 08/10/2022 0831   ALBUMIN 4.2 08/10/2022 0831   AST 18 08/10/2022 0831   ALT 20 08/10/2022 0831   ALKPHOS 109 08/10/2022 0831   BILITOT 0.4 08/10/2022 0831      Component Value Date/Time   TSH 3.530 08/10/2022 0831   Nutritional Lab Results  Component Value Date   VD25OH 61.9 08/10/2022   VD25OH 63.2 02/01/2022   VD25OH 60.3 07/20/2021     ASSESSMENT AND PLAN  TREATMENT PLAN FOR OBESITY:  Recommended Dietary Goals  Adrean is currently in the action stage of change. As such, her goal is to continue weight management plan. She has agreed to keeping a food journal and adhering to recommended goals of 1500 calories and 100 protein.  Behavioral Intervention  We discussed the following Behavioral Modification Strategies today: increasing lean protein intake, decreasing simple carbohydrates , increasing vegetables, increasing lower glycemic fruits, increasing fiber rich foods, avoiding skipping meals, increasing water intake, continue to practice mindfulness when eating, and planning for success.  Additional resources provided today: NA  Recommended Physical Activity Goals  Jera has been advised to work up to 150 minutes of moderate intensity aerobic activity a week and strengthening exercises 2-3 times per week for cardiovascular health, weight loss maintenance and preservation of muscle mass.   She has agreed to Continue current level of physical activity    Pharmacotherapy We discussed various medication options to help Katalin with her weight loss efforts and we both agreed to continue [facility] 2.4mg .  ASSOCIATED CONDITIONS ADDRESSED TODAY  Action/Plan  Vitamin D deficiency -     Vitamin D (Ergocalciferol); Take 1  capsule (50,000 Units total) by mouth 2 times a month  Dispense: 2 capsule; Refill: 0  Morbid obesity (HCC) -     ZOXWRU; Inject 2.4 mg into the skin once a week.  Dispense: 9 mL; Refill: 0  BMI 40.0-44.9, adult (HCC) -     EAVWUJ; Inject 2.4 mg into the skin once a week.  Dispense: 9 mL; Refill: 0      Patient would like to obtain labs in August.     Return in about 2 months (around 07/25/2023).Marland Kitchen She was informed of the importance of frequent follow up visits to maximize her success with intensive lifestyle modifications for her multiple health conditions.   ATTESTASTION STATEMENTS:  Reviewed by  clinician on day of visit: allergies, medications, problem list, medical history, surgical history, family history, social history, and previous encounter notes.     Theodis Sato. Anarely Nicholls FNP-C

## 2023-06-15 ENCOUNTER — Other Ambulatory Visit: Payer: Self-pay | Admitting: Nurse Practitioner

## 2023-06-15 DIAGNOSIS — Z6841 Body Mass Index (BMI) 40.0 and over, adult: Secondary | ICD-10-CM

## 2023-06-15 MED ORDER — WEGOVY 2.4 MG/0.75ML ~~LOC~~ SOAJ
2.4000 mg | SUBCUTANEOUS | 0 refills | Status: DC
Start: 2023-06-15 — End: 2023-07-18

## 2023-06-30 ENCOUNTER — Other Ambulatory Visit: Payer: Self-pay | Admitting: Nurse Practitioner

## 2023-06-30 DIAGNOSIS — E559 Vitamin D deficiency, unspecified: Secondary | ICD-10-CM

## 2023-07-10 ENCOUNTER — Emergency Department (HOSPITAL_BASED_OUTPATIENT_CLINIC_OR_DEPARTMENT_OTHER)
Admission: EM | Admit: 2023-07-10 | Discharge: 2023-07-10 | Disposition: A | Discharge status: Home / Self Care | Payer: 59 | Source: Home / Self Care | Attending: Emergency Medicine | Admitting: Emergency Medicine

## 2023-07-10 ENCOUNTER — Other Ambulatory Visit: Payer: Self-pay

## 2023-07-10 DIAGNOSIS — S61012A Laceration without foreign body of left thumb without damage to nail, initial encounter: Secondary | ICD-10-CM | POA: Diagnosis not present

## 2023-07-10 DIAGNOSIS — Z23 Encounter for immunization: Secondary | ICD-10-CM | POA: Insufficient documentation

## 2023-07-10 DIAGNOSIS — W25XXXA Contact with sharp glass, initial encounter: Secondary | ICD-10-CM | POA: Diagnosis not present

## 2023-07-10 MED ORDER — LIDOCAINE HCL (PF) 1 % IJ SOLN
10.0000 mL | Freq: Once | INTRAMUSCULAR | Status: AC
  Administered 2023-07-10: 10 mL
  Filled 2023-07-10: qty 10

## 2023-07-10 MED ORDER — TETANUS-DIPHTH-ACELL PERTUSSIS 5-2.5-18.5 LF-MCG/0.5 IM SUSY
0.5000 mL | PREFILLED_SYRINGE | Freq: Once | INTRAMUSCULAR | Status: AC
  Administered 2023-07-10: 0.5 mL via INTRAMUSCULAR
  Filled 2023-07-10: qty 0.5

## 2023-07-10 NOTE — ED Provider Notes (Signed)
Susan Dorsey   CSN: [id number] Arrival date & time: 07/10/23  0741     History  Chief Complaint  Patient presents with   Laceration    Susan Dorsey ENT is a 62 y.o. female.  Patient is a 62 year old woman presenting to the ED with a laceration. Patient cut her left thumb on a broken glass bottle. She has approximately 2.5cm laceration on the inside of her left thumb. Patient does not take any blood thinners. No excessive bleeding. Reports some mild numbness, no loss of mobility.        Home Medications Prior to Admission medications   Medication Sig Start Date End Date Taking? Authorizing Provider  Calcium Carbonate-Vit D-Min (CALTRATE 600+D PLUS PO) Take by mouth.    [provider]  cetirizine (ZYRTEC) 10 MG tablet Take 10 mg by mouth daily.    [provider]  DHA-EPA-Vitamin E (OMEGA-3 COMPLEX PO) Take 2 capsules by mouth 2 (two) times daily.    [provider]  diphenhydrAMINE (BENADRYL) 25 MG tablet Take 25 mg by mouth at bedtime.     [provider]  FIBER ADULT GUMMIES 2 g CHEW Chew 2 tablets by mouth daily.     [provider]  fluticasone (FLONASE) 50 MCG/ACT nasal spray Place 2 sprays into the nose daily.  06/11/11   [provider]  Ibuprofen 200 MG CAPS Take by mouth.    [provider]  Magnesium 250 MG TABS Take 250 mg by mouth daily.     [provider]  Melatonin 5 MG CAPS Take 5 mg by mouth at bedtime.    [provider]  montelukast (SINGULAIR) 10 MG tablet Take 10 mg by mouth daily.     [provider]  Multiple Vitamin (MULTIVITAMIN WITH MINERALS) TABS Take 1 tablet by mouth daily.    [provider]  omeprazole (PRILOSEC) 20 MG capsule Take 20 mg by mouth daily.    [provider]  Plant Sterols and Stanols (CHOLEST OFF PO) Take 1 capsule by mouth daily.    [provider]  prednisoLONE  acetate (PRED FORTE) 1 % ophthalmic suspension SMARTSIG:1 Drop(s) In Eye(s) Morning-Night 07/01/20   [provider]  Semaglutide-Weight Management (WEGOVY) 2.4 MG/0.75ML SOAJ Inject 2.4 mg into the skin once a week. 06/15/23   Irene Limbo, FNP  Vitamin D, Ergocalciferol, (DRISDOL) 1.25 MG (50000 UNIT) CAPS capsule TAKE 1 CAPSULE BY MOUTH 2 TIMES A MONTH 07/03/23   Irene Limbo, FNP      Allergies    Aspirin, Cola (syrup), Nsaids, Chocolate, Fructose, Gluten meal, and Other    Review of Systems   Review of Systems  Neurological:  Positive for numbness.    Physical Exam Updated Vital Signs BP (!) 147/80 (BP Location: Right Arm)   Pulse 68   Temp 98.4 F (36.9 C)   Resp 16   Ht 5\' 6"  (1.676 m)   Wt 122.5 kg   LMP 02/15/2020   SpO2 100%   BMI 43.58 kg/m  Physical Exam Skin:    Comments: Approximately 2.5 cm superficial laceration to lateral left thumb     ED Results / Procedures / Treatments   Labs (all labs ordered are listed, but only abnormal results are displayed) Labs Reviewed - No data to display  EKG None  Radiology No results found.  Procedures .Marland KitchenLaceration Repair  Date/Time: 07/10/2023 8:16 AM  Performed by: Monna Fam, MD Authorized  by: Ernie Avena, MD   Consent:    Consent obtained:  Verbal   Risks, benefits, and alternatives were discussed: yes     Risks discussed:  Pain Universal protocol:    Patient identity confirmed:  Verbally with patient Anesthesia:    Anesthesia method:  Local infiltration   Local anesthetic:  Lidocaine 1% w/o epi Laceration details:    Location:  Hand   Hand location:  L palm   Length (cm):  2.5 Pre-procedure details:    Preparation:  Patient was prepped and draped in usual sterile fashion Exploration:    Limited defect created (wound extended): no     Hemostasis achieved with:  Direct pressure   Imaging outcome: foreign body not noted     Contaminated: no   Treatment:    Area cleansed  with:  Saline   Amount of cleaning:  Standard   Debridement:  None   Undermining:  None   Scar revision: no   Skin repair:    Repair method:  Sutures   Suture size:  5-0   Suture material:  Prolene   Suture technique:  Simple interrupted   Number of sutures:  5 Approximation:    Approximation:  Close Repair type:    Repair type:  Simple Post-procedure details:    Dressing:  Tube gauze   Procedure completion:  Tolerated     Medications Ordered in ED Medications  lidocaine (PF) (XYLOCAINE) 1 % injection 10 mL (10 mLs Infiltration Given by Other 07/10/23 7829)    ED Course/ Medical Decision Making/ A&P                             Medical Decision Making Risk Prescription drug management.  Patient is a 62 year old woman presenting with left thumb laceration. My differential includes laceration with vs. Without ligamentous and arterial involvement. On physical exam, patient has a 2.5 cm laceration to left thumb. Patient does endorse some numbness. No loss of movement or range of motion. Patient is right handed.   Lidocaine without epi was applied to the wound. I gave three simple interrupted sutures with Prolene. The wound was dressed prior to discharge.   Sutures to be removed in seven days. Return precautions and anticipatory guidance discussed with patient and included in discharge paperwork.         Final Clinical Impression(s) / ED Diagnoses Final diagnoses:  Laceration of left thumb without foreign body without damage to nail, initial encounter    Rx / DC Orders ED Discharge Orders     None         Monna Fam, MD 07/10/23 5621    Ernie Avena, MD 07/10/23 3086    Ernie Avena, MD 07/10/23 [phone]

## 2023-07-10 NOTE — Discharge Instructions (Addendum)
Please follow-up in 7 to 10 days for suture removal and repeat wound assessment.  Watch for signs of infection which include worsening redness, swelling, pain, fever/chills, purulent drainage from the wound.  Okay to clean the wound daily with warm soapy water, avoid submersion in a pool or hot tubs, can apply dressings daily.

## 2023-07-10 NOTE — ED Triage Notes (Signed)
Pt arrived POV from home, caox4, ambulatory with laceration to the L hand from glass bottle containing vinegar. 6/10 pain in triage, bleeding controlled. Good motor and sensation to L fingers/thumb. Denies any other injury or complaint.

## 2023-07-18 ENCOUNTER — Other Ambulatory Visit: Payer: Self-pay | Admitting: Nurse Practitioner

## 2023-07-18 ENCOUNTER — Other Ambulatory Visit (HOSPITAL_COMMUNITY): Payer: Self-pay

## 2023-07-18 DIAGNOSIS — Z6841 Body Mass Index (BMI) 40.0 and over, adult: Secondary | ICD-10-CM

## 2023-07-18 MED ORDER — WEGOVY 2.4 MG/0.75ML ~~LOC~~ SOAJ
2.4000 mg | SUBCUTANEOUS | 0 refills | Status: DC
Start: 2023-07-18 — End: 2023-08-08
  Filled 2023-07-18: qty 3, 28d supply, fill #0

## 2023-07-18 NOTE — Telephone Encounter (Signed)
LAST APPOINTMENT DATE: 05/25/2023 NEXT APPOINTMENT DATE: 08/08/2023   [facility] [address] - [place], [place] - [address] [place] [address] Phone: [phone] Fax: [phone]  [id number] [place], [place] - [address] AT [facility] [address] [facility] [address] [facility] [address] [place] [address] Phone: [phone] Fax: [phone]  [facility] [address] [address] Phone: [phone] Fax: [phone]  [facility] [address] [place] [address] Phone: [phone] Fax: [phone]  Patient is requesting a refill of the following medications: Requested Prescriptions   Pending Prescriptions Disp Refills   Semaglutide-Weight Management (WEGOVY) 2.4 MG/0.75ML SOAJ 9 mL 0    Sig: Inject 2.4 mg into the skin once a week.    Date last filled: 06/15/2023 Previously prescribed by Irene Limbo  Lab Results  Component Value Date   HGBA1C 5.4 02/01/2022   HGBA1C 5.3 07/20/2021   HGBA1C 5.2 07/30/2020   Lab Results  Component Value Date   LDLCALC 118 (H) 02/01/2022   CREATININE 0.85 08/10/2022   Lab Results  Component Value Date   VD25OH 61.9 08/10/2022   VD25OH 63.2 02/01/2022   VD25OH 60.3 07/20/2021    BP Readings from Last 3 Encounters:  07/10/23 (!) 147/80  05/25/23 133/85  04/24/23 123/83

## 2023-08-08 ENCOUNTER — Ambulatory Visit (INDEPENDENT_AMBULATORY_CARE_PROVIDER_SITE_OTHER): Payer: 59 | Admitting: Nurse Practitioner

## 2023-08-08 ENCOUNTER — Encounter: Payer: Self-pay | Admitting: Nurse Practitioner

## 2023-08-08 ENCOUNTER — Telehealth: Payer: Self-pay

## 2023-08-08 VITALS — BP 104/75 | HR 64 | Temp 97.8°F | Ht 66.0 in | Wt 267.0 lb

## 2023-08-08 DIAGNOSIS — K912 Postsurgical malabsorption, not elsewhere classified: Secondary | ICD-10-CM

## 2023-08-08 DIAGNOSIS — E7849 Other hyperlipidemia: Secondary | ICD-10-CM

## 2023-08-08 DIAGNOSIS — Z6841 Body Mass Index (BMI) 40.0 and over, adult: Secondary | ICD-10-CM

## 2023-08-08 DIAGNOSIS — E559 Vitamin D deficiency, unspecified: Secondary | ICD-10-CM

## 2023-08-08 MED ORDER — WEGOVY 2.4 MG/0.75ML ~~LOC~~ SOAJ: 2.4000 mg | SUBCUTANEOUS | 0 refills | Status: DC

## 2023-08-08 MED ORDER — VITAMIN D (ERGOCALCIFEROL) 1.25 MG (50000 UNIT) PO CAPS: ORAL_CAPSULE | ORAL | 0 refills | Status: DC

## 2023-08-08 NOTE — Progress Notes (Signed)
Office: [phone]  /  Fax: [phone]  WEIGHT SUMMARY AND BIOMETRICS  Weight Lost Since Last Visit: 2lb  Weight Gained Since Last Visit: 0lb   Vitals Temp: 97.8 F (36.6 C) BP: 104/75 Pulse Rate: 64 SpO2: 99 %   Anthropometric Measurements Height: 5\' 6"  (1.676 m) Weight: 267 lb (121.1 kg) BMI (Calculated): 43.12 Weight at Last Visit: 269lb Weight Lost Since Last Visit: 2lb Weight Gained Since Last Visit: 0lb Starting Weight: 273lb Total Weight Loss (lbs): 6 lb (2.722 kg)   Body Composition  Body Fat %: 44.7 % Fat Mass (lbs): 119.4 lbs Muscle Mass (lbs): 140.4 lbs Total Body Water (lbs): 101.4 lbs Visceral Fat Rating : 15   Other Clinical Data Fasting: Yes Labs: Yes Today's Visit #: 94 Starting Date: 07/30/20     HPI  Chief Complaint: OBESITY  Susan Dorsey is here to discuss her progress with her obesity treatment plan. She is on the 1500 calories and 80 grams of protein and states she is following her eating plan approximately 55 % of the time. She states she is exercising 45 minutes 3 days per week.   Interval History:  Since last office visit she has lost 2 pounds.  She is working on [employer].  She recently did a program at [facility] and had a home sleep study.  She doesn't have the results but will send to me via mychart to review.  She has been under a lot of stress at home, denies stress eating.  She feels like she is in a good place and is overall doing better.  Drinking water, coffee and a diet soda.     Pharmacotherapy for weight loss: She is currently taking Wegovy 2.4mg  for medical weight loss.  Reports side effects of occasional diarrhea.  Has started helping with polyphagia especially over the past 2 weeks.    Previous pharmacotherapy for medical weight loss:  Saxenda, Mounjaro-side effects of diarrhea and vomiting.    Bariatric surgery:   Patient is status post Lap Band with Dr. Simona Huh in 2008 and conversion to Sleeve Gastrectomy in 2020 by  Dr. Daphine Deutscher. Her highest weight prior to surgery was 380 lbs and Her nadir weight after surgery was 211 lbs. She is taking calcium, MV with iron and Vit D 50,000 IU every 2 weeks. She reports some restriction.   Vit D deficiency  She is taking Vit D 50,000 IU every 2 weeks.  Denies side effects.  Denies nausea, vomiting or muscle weakness.   Hyperlipidemia Medication(s): none.    FH:  mother  Lab Results  Component Value Date   CHOL 193 02/01/2022   HDL 56 02/01/2022   LDLCALC 118 (H) 02/01/2022   TRIG 106 02/01/2022   CHOLHDL 3.4 02/01/2022   Lab Results  Component Value Date   ALT 20 08/10/2022   AST 18 08/10/2022   ALKPHOS 109 08/10/2022   BILITOT 0.4 08/10/2022   The 10-year ASCVD risk score (Arnett DK, et al., 2019) is: 2.1%   Values used to calculate the score:     Age: 45 years     Sex: Female     Is Non-Hispanic African American: No     Diabetic: No     Tobacco smoker: No     Systolic Blood Pressure: 104 mmHg     Is BP treated: No     HDL Cholesterol: 56 mg/dL     Total Cholesterol: 193 mg/dL   PHYSICAL EXAM:  Blood pressure 104/75, pulse 64, temperature  97.8 F (36.6 C), height 5\' 6"  (1.676 m), weight 267 lb (121.1 kg), last menstrual period 02/15/2020, SpO2 99%. Body mass index is 43.09 kg/m.  General: She is overweight, cooperative, alert, well developed, and in no acute distress. PSYCH: Has normal mood, affect and thought process.   Extremities: No edema.  Neurologic: No gross sensory or motor deficits. No tremors or fasciculations noted.    DIAGNOSTIC DATA REVIEWED:  BMET    Component Value Date/Time   NA 141 08/10/2022 0831   K 4.7 08/10/2022 0831   CL 100 08/10/2022 0831   CO2 25 08/10/2022 0831   GLUCOSE 75 08/10/2022 0831   GLUCOSE 89 02/27/2019 0807   BUN 13 08/10/2022 0831   CREATININE 0.85 08/10/2022 0831   CALCIUM 9.6 08/10/2022 0831   GFRNONAA 76 07/30/2020 1201   GFRAA 88 07/30/2020 1201   Lab Results  Component Value Date    HGBA1C 5.4 02/01/2022   HGBA1C 5.1 07/14/2017   Lab Results  Component Value Date   INSULIN 5.2 02/01/2022   INSULIN 6.0 07/14/2017   Lab Results  Component Value Date   TSH 3.530 08/10/2022   CBC    Component Value Date/Time   WBC 6.5 08/10/2022 0831   WBC 12.7 (H) 03/05/2019 0430   RBC 4.76 08/10/2022 0831   RBC 4.57 03/05/2019 0430   HGB 14.8 08/10/2022 0831   HCT 44.7 08/10/2022 0831   PLT 247 08/10/2022 0831   MCV 94 08/10/2022 0831   MCH 31.1 08/10/2022 0831   MCH 29.3 03/05/2019 0430   MCHC 33.1 08/10/2022 0831   MCHC 31.2 03/05/2019 0430   RDW 12.5 08/10/2022 0831   Iron Studies    Component Value Date/Time   IRON 76 07/30/2020 1201   TIBC 294 07/30/2020 1201   FERRITIN 71 08/10/2022 0831   IRONPCTSAT 26 07/30/2020 1201   Lipid Panel     Component Value Date/Time   CHOL 193 02/01/2022 0751   TRIG 106 02/01/2022 0751   HDL 56 02/01/2022 0751   CHOLHDL 3.4 02/01/2022 0751   LDLCALC 118 (H) 02/01/2022 0751   Hepatic Function Panel     Component Value Date/Time   PROT 6.6 08/10/2022 0831   ALBUMIN 4.2 08/10/2022 0831   AST 18 08/10/2022 0831   ALT 20 08/10/2022 0831   ALKPHOS 109 08/10/2022 0831   BILITOT 0.4 08/10/2022 0831      Component Value Date/Time   TSH 3.530 08/10/2022 0831   Nutritional Lab Results  Component Value Date   VD25OH 61.9 08/10/2022   VD25OH 63.2 02/01/2022   VD25OH 60.3 07/20/2021     ASSESSMENT AND PLAN  TREATMENT PLAN FOR OBESITY:  Recommended Dietary Goals  [name] is currently in the action stage of change. As such, her goal is to continue weight management plan. She has agreed to keeping a food journal and adhering to recommended goals of 1400-1500 calories and 90+ protein.  Behavioral Intervention  We discussed the following Behavioral Modification Strategies today: increasing lean protein intake, decreasing simple carbohydrates , increasing vegetables, increasing lower glycemic fruits, avoiding skipping  meals, increasing water intake, reading food labels , keeping healthy foods at home, continue to practice mindfulness when eating, and planning for success.  Additional resources provided today: NA  Recommended Physical Activity Goals  [name] has been advised to work up to 150 minutes of moderate intensity aerobic activity a week and strengthening exercises 2-3 times per week for cardiovascular health, weight loss maintenance and preservation of muscle mass.  She has agreed to Continue current level of physical activity    Pharmacotherapy We discussed various medication options to help Runa with her weight loss efforts and we both agreed to continue [facility] 2.4mg .  side effects discussed.  ASSOCIATED CONDITIONS ADDRESSED TODAY  Action/Plan  Vitamin D deficiency -     Vitamin D (Ergocalciferol); Take 1 pill twice monthly  Dispense: 2 capsule; Refill: 0 -     VITAMIN D 25 Hydroxy (Vit-D Deficiency, Fractures)  Postoperative malabsorption -     Vitamin B1 -     Prealbumin -     Folate -     Ferritin -     Vitamin B12 -     VITAMIN D 25 Hydroxy (Vit-D Deficiency, Fractures) -     TSH -     Lipid Panel With LDL/HDL Ratio -     Comprehensive metabolic panel -     CBC with Differential/Platelet -     Iron  Other hyperlipidemia -     Lipid Panel With LDL/HDL Ratio  Morbid obesity (HCC) -     ZOXWRU; Inject 2.4 mg into the skin once a week.  Dispense: 9 mL; Refill: 0  BMI 40.0-44.9, adult (HCC) -     EAVWUJ; Inject 2.4 mg into the skin once a week.  Dispense: 9 mL; Refill: 0         No follow-ups on file.Marland Kitchen She was informed of the importance of frequent follow up visits to maximize her success with intensive lifestyle modifications for her multiple health conditions.   ATTESTASTION STATEMENTS:  Reviewed by clinician on day of visit: allergies, medications, problem list, medical history, surgical history, family history, social history, and previous encounter notes.      Theodis Sato. Vishnu Moeller FNP-C

## 2023-08-08 NOTE — Telephone Encounter (Signed)
PA submitted through Cover My Meds for [facility]. Awaiting insurance determination. Key: Susan Dorsey

## 2023-08-09 ENCOUNTER — Other Ambulatory Visit: Payer: Self-pay | Admitting: Nurse Practitioner

## 2023-08-09 DIAGNOSIS — Z6841 Body Mass Index (BMI) 40.0 and over, adult: Secondary | ICD-10-CM

## 2023-08-09 NOTE — Telephone Encounter (Signed)
Received fax from [facility] that Susan Dorsey is denied. Per [employer] has denied a request on file for [facility] for this member"

## 2023-08-12 LAB — CBC WITH DIFFERENTIAL/PLATELET
Hematocrit: 48.1 % — ABNORMAL HIGH (ref 34.0–46.6)
Hemoglobin: 15.8 g/dL (ref 11.1–15.9)
MCV: 93 fL (ref 79–97)
Monocytes Absolute: 0.5 10*3/uL (ref 0.1–0.9)

## 2023-08-12 LAB — COMPREHENSIVE METABOLIC PANEL
Albumin: 4.4 g/dL (ref 3.8–4.9)
BUN/Creatinine Ratio: 20 (ref 12–28)
Bilirubin Total: 0.5 mg/dL (ref 0.0–1.2)

## 2023-08-12 LAB — LIPID PANEL WITH LDL/HDL RATIO: LDL/HDL Ratio: 1.7 ratio (ref 0.0–3.2)

## 2023-08-14 ENCOUNTER — Encounter: Payer: Self-pay | Admitting: Nurse Practitioner

## 2023-08-14 ENCOUNTER — Other Ambulatory Visit: Payer: Self-pay | Admitting: Nurse Practitioner

## 2023-08-14 DIAGNOSIS — Z6841 Body Mass Index (BMI) 40.0 and over, adult: Secondary | ICD-10-CM

## 2023-08-17 ENCOUNTER — Encounter: Payer: Self-pay | Admitting: Nurse Practitioner

## 2023-08-23 ENCOUNTER — Telehealth: Payer: Self-pay

## 2023-08-23 NOTE — Telephone Encounter (Signed)
PA submitted through Cover My Meds for Saxenda. Awaiting insurance determination. Key: [id number]

## 2023-08-23 NOTE — Telephone Encounter (Signed)
Per Cover My Meds:  Message from plan: Request Reference Number: [id number]. SAXENDA INJ 18MG /3ML is approved through 08/22/2024. Your patient may now fill this prescription and it will be covered.. Authorization Expiration Date: August 22, 2024.

## 2023-09-19 ENCOUNTER — Other Ambulatory Visit (HOSPITAL_COMMUNITY): Payer: Self-pay

## 2023-09-19 ENCOUNTER — Ambulatory Visit (INDEPENDENT_AMBULATORY_CARE_PROVIDER_SITE_OTHER): Payer: 59 | Admitting: Nurse Practitioner

## 2023-09-19 ENCOUNTER — Encounter: Payer: Self-pay | Admitting: Nurse Practitioner

## 2023-09-19 VITALS — BP 138/87 | HR 65 | Temp 97.7°F | Ht 66.0 in | Wt 266.0 lb

## 2023-09-19 DIAGNOSIS — Z6841 Body Mass Index (BMI) 40.0 and over, adult: Secondary | ICD-10-CM

## 2023-09-19 DIAGNOSIS — R632 Polyphagia: Secondary | ICD-10-CM | POA: Diagnosis not present

## 2023-09-19 MED ORDER — SAXENDA 18 MG/3ML ~~LOC~~ SOPN
3.0000 mg | PEN_INJECTOR | Freq: Every day | SUBCUTANEOUS | 0 refills | Status: DC
Start: 2023-09-19 — End: 2023-10-17
  Filled 2023-09-19: qty 15, 30d supply, fill #0

## 2023-09-19 MED ORDER — BD PEN NEEDLE MINI U/F 31G X 5 MM MISC
0 refills | Status: DC
Start: 2023-09-19 — End: 2024-01-11
  Filled 2023-09-19: qty 100, 31d supply, fill #0

## 2023-09-19 NOTE — Progress Notes (Signed)
Office: [phone]  /  Fax: [phone]  WEIGHT SUMMARY AND BIOMETRICS  Weight Lost Since Last Visit: 1lb  Weight Gained Since Last Visit: 0lb   Vitals Temp: 97.7 F (36.5 C) BP: 138/87 Pulse Rate: 65 SpO2: 100 %   Anthropometric Measurements Height: 5\' 6"  (1.676 m) Weight: 266 lb (120.7 kg) BMI (Calculated): 42.95 Weight at Last Visit: 267lb Weight Lost Since Last Visit: 1lb Weight Gained Since Last Visit: 0lb Starting Weight: 273lb Total Weight Loss (lbs): 7 lb (3.175 kg)   Body Composition  Body Fat %: 51.6 % Fat Mass (lbs): 137.2 lbs Muscle Mass (lbs): 122.4 lbs Total Body Water (lbs): 94 lbs Visceral Fat Rating : 17   Other Clinical Data Fasting: Yes Labs: No Today's Visit #: 42 Starting Date: 07/30/20     HPI  Chief Complaint: OBESITY  Susan Dorsey is here to discuss her progress with her obesity treatment plan. She is on the keeping a food journal and adhering to recommended goals of 1500 calories and 80 protein and states she is following her eating plan approximately 65 % of the time. She states she is exercising 60 minutes 3 days per week.   Interval History:  Since last office visit she has lost 1 pound.  She is struggling with polyphagia and cravings.  She is drinking water, coffee and diet sodas.   BF: Protein shake or yogurt with oatmeal Snack:  protein bar Lunch:  protein and a vegetable or sandwich Snack:  vegetables Dinner:  protein, vegetable and sometimes carb   Pharmacotherapy for weight loss: She is currently taking Saxenda 3 mg for medical weight loss.  Denies side effects.    Previous pharmacotherapy for medical weight loss:  Saxenda, Wegovy and Mounjaro-side effects of diarrhea and vomiting.  Overall felt that Wegovy worked better for her.    Bariatric surgery:  Patient is status post Lap Band with Dr. Simona Huh in 2008 and conversion to Sleeve Gastrectomy in 2020 by Dr. Daphine Deutscher. Her highest weight prior to surgery was 380 lbs and Her  nadir weight after surgery was 211 lbs. She is taking calcium, MV with iron and Vit D 50,000 IU every 2 weeks. She reports some restriction.    PHYSICAL EXAM:  Blood pressure 138/87, pulse 65, temperature 97.7 F (36.5 C), height 5\' 6"  (1.676 m), weight 266 lb (120.7 kg), last menstrual period 02/15/2020, SpO2 100%. Body mass index is 42.93 kg/m.  General: She is overweight, cooperative, alert, well developed, and in no acute distress. PSYCH: Has normal mood, affect and thought process.   Extremities: No edema.  Neurologic: No gross sensory or motor deficits. No tremors or fasciculations noted.    DIAGNOSTIC DATA REVIEWED:  BMET    Component Value Date/Time   NA 142 08/08/2023 0825   K 4.8 08/08/2023 0825   CL 102 08/08/2023 0825   CO2 25 08/08/2023 0825   GLUCOSE 78 08/08/2023 0825   GLUCOSE 89 02/27/2019 0807   BUN 16 08/08/2023 0825   CREATININE 0.81 08/08/2023 0825   CALCIUM 9.9 08/08/2023 0825   GFRNONAA 76 07/30/2020 1201   GFRAA 88 07/30/2020 1201   Lab Results  Component Value Date   HGBA1C 5.4 02/01/2022   HGBA1C 5.1 07/14/2017   Lab Results  Component Value Date   INSULIN 5.2 02/01/2022   INSULIN 6.0 07/14/2017   Lab Results  Component Value Date   TSH 3.810 08/08/2023   CBC    Component Value Date/Time   WBC 6.6 08/08/2023 0825  WBC 12.7 (H) 03/05/2019 0430   RBC 5.16 08/08/2023 0825   RBC 4.57 03/05/2019 0430   HGB 15.8 08/08/2023 0825   HCT 48.1 (H) 08/08/2023 0825   PLT 262 08/08/2023 0825   MCV 93 08/08/2023 0825   MCH 30.6 08/08/2023 0825   MCH 29.3 03/05/2019 0430   MCHC 32.8 08/08/2023 0825   MCHC 31.2 03/05/2019 0430   RDW 12.8 08/08/2023 0825   Iron Studies    Component Value Date/Time   IRON 100 08/08/2023 0825   TIBC 294 07/30/2020 1201   FERRITIN 59 08/08/2023 0825   IRONPCTSAT 26 07/30/2020 1201   Lipid Panel     Component Value Date/Time   CHOL 197 08/08/2023 0825   TRIG 92 08/08/2023 0825   HDL 68 08/08/2023 0825    CHOLHDL 3.4 02/01/2022 0751   LDLCALC 113 (H) 08/08/2023 0825   Hepatic Function Panel     Component Value Date/Time   PROT 6.9 08/08/2023 0825   ALBUMIN 4.4 08/08/2023 0825   AST 20 08/08/2023 0825   ALT 22 08/08/2023 0825   ALKPHOS 127 (H) 08/08/2023 0825   BILITOT 0.5 08/08/2023 0825      Component Value Date/Time   TSH 3.810 08/08/2023 0825   Nutritional Lab Results  Component Value Date   VD25OH 69.2 08/08/2023   VD25OH 61.9 08/10/2022   VD25OH 63.2 02/01/2022     ASSESSMENT AND PLAN  TREATMENT PLAN FOR OBESITY:  Recommended Dietary Goals  Susan Dorsey is currently in the action stage of change. As such, her goal is to continue weight management plan. She has agreed to keeping a food journal and adhering to recommended goals of 1400-1500 calories and 90 protein.  Behavioral Intervention  We discussed the following Behavioral Modification Strategies today: increasing lean protein intake, decreasing simple carbohydrates , increasing vegetables, increasing lower glycemic fruits, increasing water intake, work on meal planning and preparation, work on tracking and journaling calories using tracking application, reading food labels , keeping healthy foods at home, continue to practice mindfulness when eating, and planning for success.  Additional resources provided today: NA  Recommended Physical Activity Goals  Susan Dorsey has been advised to work up to 150 minutes of moderate intensity aerobic activity a week and strengthening exercises 2-3 times per week for cardiovascular health, weight loss maintenance and preservation of muscle mass.   She has agreed to Continue current level of physical activity    Pharmacotherapy We discussed various medication options to help Susan Dorsey with her weight loss efforts and we both agreed to continue Saxenda 3mg .  Side effects discussed.   ASSOCIATED CONDITIONS ADDRESSED TODAY  Action/Plan  Polyphagia -     Saxenda; Inject 3 mg into the skin  daily.  Dispense: 15 mL; Refill: 0 -     BD Pen Needle Mini U/F; USE DAILY AS DIRECTED WITH SAXENDA  Dispense: 100 each; Refill: 0  Intensive lifestyle modifications are the first line treatment for this issue. We discussed several lifestyle modifications today and she will continue to work on diet, exercise and weight loss efforts. Orders and follow up as documented in patient record.  Counseling Polyphagia is excessive hunger. Causes can include: low blood sugars, hypERthyroidism, PMS, lack of sleep, stress, insulin resistance, diabetes, certain medications, and diets that are deficient in protein and fiber.    Morbid obesity (HCC) -     Saxenda; Inject 3 mg into the skin daily.  Dispense: 15 mL; Refill: 0 -     BD Pen Needle Mini  U/F; USE DAILY AS DIRECTED WITH SAXENDA  Dispense: 100 each; Refill: 0  BMI 40.0-44.9, adult (HCC) -     Saxenda; Inject 3 mg into the skin daily.  Dispense: 15 mL; Refill: 0 -     BD Pen Needle Mini U/F; USE DAILY AS DIRECTED WITH SAXENDA  Dispense: 100 each; Refill: 0     Labs reviewed in chart from 08/08/23  Options discussed today: Topamax for cravings Wellbutrin for cravings.  Changing Saxenda to lunchtime.    Return in about 4 weeks (around 10/17/2023).Marland Kitchen She was informed of the importance of frequent follow up visits to maximize her success with intensive lifestyle modifications for her multiple health conditions.   ATTESTASTION STATEMENTS:  Reviewed by clinician on day of visit: allergies, medications, problem list, medical history, surgical history, family history, social history, and previous encounter notes.    Theodis Sato. Koren Plyler FNP-C

## 2023-09-20 ENCOUNTER — Other Ambulatory Visit (HOSPITAL_COMMUNITY): Payer: Self-pay

## 2023-10-17 ENCOUNTER — Ambulatory Visit (INDEPENDENT_AMBULATORY_CARE_PROVIDER_SITE_OTHER): Payer: 59 | Admitting: Nurse Practitioner

## 2023-10-17 ENCOUNTER — Encounter: Payer: Self-pay | Admitting: Nurse Practitioner

## 2023-10-17 ENCOUNTER — Other Ambulatory Visit (HOSPITAL_BASED_OUTPATIENT_CLINIC_OR_DEPARTMENT_OTHER): Payer: Self-pay

## 2023-10-17 VITALS — BP 132/85 | HR 64 | Temp 97.9°F | Ht 66.0 in | Wt 267.0 lb

## 2023-10-17 DIAGNOSIS — Z6841 Body Mass Index (BMI) 40.0 and over, adult: Secondary | ICD-10-CM

## 2023-10-17 DIAGNOSIS — R632 Polyphagia: Secondary | ICD-10-CM

## 2023-10-17 MED ORDER — SAXENDA 18 MG/3ML ~~LOC~~ SOPN
3.0000 mg | PEN_INJECTOR | Freq: Every day | SUBCUTANEOUS | 0 refills | Status: DC
Start: 2023-10-17 — End: 2023-11-16
  Filled 2023-10-17: qty 15, 30d supply, fill #0

## 2023-10-17 NOTE — Progress Notes (Signed)
Office: [phone]  /  Fax: [phone]  WEIGHT SUMMARY AND BIOMETRICS  Weight Lost Since Last Visit: 0lb  Weight Gained Since Last Visit: 1lb   Vitals Temp: 97.9 F (36.6 C) BP: 132/85 Pulse Rate: 64 SpO2: 99 %   Anthropometric Measurements Height: 5\' 6"  (1.676 m) Weight: 267 lb (121.1 kg) BMI (Calculated): 43.12 Weight at Last Visit: 266lb Weight Lost Since Last Visit: 0lb Weight Gained Since Last Visit: 1lb Starting Weight: 273lb Total Weight Loss (lbs): 6 lb (2.722 kg)   Body Composition  Body Fat %: 52.8 % Fat Mass (lbs): 141.4 lbs Muscle Mass (lbs): 120 lbs Total Body Water (lbs): 96.6 lbs Visceral Fat Rating : 18   Other Clinical Data Fasting: Yes Labs: No Today's Visit #: 57 Starting Date: 07/30/20     HPI  Chief Complaint: OBESITY  Aracelli is here to discuss her progress with her obesity treatment plan. She is on the keeping a food journal and adhering to recommended goals of 1500 calories and 80 protein and states she is following her eating plan approximately 40 % of the time. She states she is exercising 60 minutes 3 days per week.   Interval History:  Since last office visit she has gained 1 pound.  She started tracking recently and is watching her portion sizes.  Has been averaging around 2000 calories and 80-100 grams of protein. Notes some cravings.     Pharmacotherapy for weight loss: She is currently taking Saxenda 3 mg for medical weight loss.  Denies side effects.  Helps with polyphagia and cravings.    Previous pharmacotherapy for medical weight loss:  Saxenda, Wegovy and Mounjaro-side effects of diarrhea and vomiting.  Overall felt that Wegovy worked best for her.    Bariatric surgery:   Patient is status post Lap Band with Dr. Simona Huh in 2008 and conversion to Sleeve Gastrectomy in 2020 by Dr. Daphine Deutscher. Her highest weight prior to surgery was 380 lbs and Her nadir weight after surgery was 211 lbs. She is taking calcium, MV with iron and  Vit D 50,000 IU every 2 weeks. She reports some restriction.    PHYSICAL EXAM:  Blood pressure 132/85, pulse 64, temperature 97.9 F (36.6 C), height 5\' 6"  (1.676 m), weight 267 lb (121.1 kg), last menstrual period 02/15/2020, SpO2 99%. Body mass index is 43.09 kg/m.  General: She is overweight, cooperative, alert, well developed, and in no acute distress. PSYCH: Has normal mood, affect and thought process.   Extremities: No edema.  Neurologic: No gross sensory or motor deficits. No tremors or fasciculations noted.    DIAGNOSTIC DATA REVIEWED:  BMET    Component Value Date/Time   NA 142 08/08/2023 0825   K 4.8 08/08/2023 0825   CL 102 08/08/2023 0825   CO2 25 08/08/2023 0825   GLUCOSE 78 08/08/2023 0825   GLUCOSE 89 02/27/2019 0807   BUN 16 08/08/2023 0825   CREATININE 0.81 08/08/2023 0825   CALCIUM 9.9 08/08/2023 0825   GFRNONAA 76 07/30/2020 1201   GFRAA 88 07/30/2020 1201   Lab Results  Component Value Date   HGBA1C 5.4 02/01/2022   HGBA1C 5.1 07/14/2017   Lab Results  Component Value Date   INSULIN 5.2 02/01/2022   INSULIN 6.0 07/14/2017   Lab Results  Component Value Date   TSH 3.810 08/08/2023   CBC    Component Value Date/Time   WBC 6.6 08/08/2023 0825   WBC 12.7 (H) 03/05/2019 0430   RBC 5.16 08/08/2023 0825  RBC 4.57 03/05/2019 0430   HGB 15.8 08/08/2023 0825   HCT 48.1 (H) 08/08/2023 0825   PLT 262 08/08/2023 0825   MCV 93 08/08/2023 0825   MCH 30.6 08/08/2023 0825   MCH 29.3 03/05/2019 0430   MCHC 32.8 08/08/2023 0825   MCHC 31.2 03/05/2019 0430   RDW 12.8 08/08/2023 0825   Iron Studies    Component Value Date/Time   IRON 100 08/08/2023 0825   TIBC 294 07/30/2020 1201   FERRITIN 59 08/08/2023 0825   IRONPCTSAT 26 07/30/2020 1201   Lipid Panel     Component Value Date/Time   CHOL 197 08/08/2023 0825   TRIG 92 08/08/2023 0825   HDL 68 08/08/2023 0825   CHOLHDL 3.4 02/01/2022 0751   LDLCALC 113 (H) 08/08/2023 0825   Hepatic  Function Panel     Component Value Date/Time   PROT 6.9 08/08/2023 0825   ALBUMIN 4.4 08/08/2023 0825   AST 20 08/08/2023 0825   ALT 22 08/08/2023 0825   ALKPHOS 127 (H) 08/08/2023 0825   BILITOT 0.5 08/08/2023 0825      Component Value Date/Time   TSH 3.810 08/08/2023 0825   Nutritional Lab Results  Component Value Date   VD25OH 69.2 08/08/2023   VD25OH 61.9 08/10/2022   VD25OH 63.2 02/01/2022     ASSESSMENT AND PLAN  TREATMENT PLAN FOR OBESITY:  Recommended Dietary Goals  Krystalynn is currently in the action stage of change. As such, her goal is to continue weight management plan. She has agreed to keeping a food journal and adhering to recommended goals of 1500-1600 calories and 80+ protein.  Behavioral Intervention  We discussed the following Behavioral Modification Strategies today: continue to work on maintaining a reduced calorie state, getting the recommended amount of protein, incorporating whole foods, making healthy choices, staying well hydrated and practicing mindfulness when eating..  Additional resources provided today: NA  Recommended Physical Activity Goals  Elba has been advised to work up to 150 minutes of moderate intensity aerobic activity a week and strengthening exercises 2-3 times per week for cardiovascular health, weight loss maintenance and preservation of muscle mass.   She has agreed to Continue current level of physical activity , Think about enjoyable ways to increase daily physical activity and overcoming barriers to exercise, and Increase physical activity in their day and reduce sedentary time (increase NEAT).   Pharmacotherapy We discussed various medication options to help Mistey with her weight loss efforts and we both agreed to continue Saxenda 3mg .  Side effects discussed.  ASSOCIATED CONDITIONS ADDRESSED TODAY  Action/Plan  Polyphagia -     Saxenda; Inject 3 mg into the skin daily.  Dispense: 15 mL; Refill: 0  Morbid obesity  (HCC) -     Saxenda; Inject 3 mg into the skin daily.  Dispense: 15 mL; Refill: 0  BMI 40.0-44.9, adult (HCC) -     Saxenda; Inject 3 mg into the skin daily.  Dispense: 15 mL; Refill: 0         Return in about 4 weeks (around 11/14/2023).Marland Kitchen She was informed of the importance of frequent follow up visits to maximize her success with intensive lifestyle modifications for her multiple health conditions.   ATTESTASTION STATEMENTS:  Reviewed by clinician on day of visit: allergies, medications, problem list, medical history, surgical history, family history, social history, and previous encounter notes.     Theodis Sato. Joshawa Dubin FNP-C

## 2023-11-16 ENCOUNTER — Encounter: Payer: Self-pay | Admitting: Nurse Practitioner

## 2023-11-16 ENCOUNTER — Ambulatory Visit: Payer: 59 | Admitting: Nurse Practitioner

## 2023-11-16 ENCOUNTER — Other Ambulatory Visit (HOSPITAL_BASED_OUTPATIENT_CLINIC_OR_DEPARTMENT_OTHER): Payer: Self-pay

## 2023-11-16 VITALS — BP 131/79 | HR 64 | Temp 97.9°F | Ht 66.0 in | Wt 269.0 lb

## 2023-11-16 DIAGNOSIS — R632 Polyphagia: Secondary | ICD-10-CM

## 2023-11-16 DIAGNOSIS — Z6841 Body Mass Index (BMI) 40.0 and over, adult: Secondary | ICD-10-CM | POA: Diagnosis not present

## 2023-11-16 MED ORDER — SAXENDA 18 MG/3ML ~~LOC~~ SOPN
3.0000 mg | PEN_INJECTOR | Freq: Every day | SUBCUTANEOUS | 0 refills | Status: DC
Start: 2023-11-16 — End: 2024-01-15
  Filled 2023-11-16: qty 15, 30d supply, fill #0
  Filled 2023-12-13 (×2): qty 15, 30d supply, fill #1

## 2023-11-16 NOTE — Progress Notes (Signed)
Office: [phone]  /  Fax: [phone]  WEIGHT SUMMARY AND BIOMETRICS  Weight Lost Since Last Visit: 0lb  Weight Gained Since Last Visit: 2lb   Vitals Temp: 97.9 F (36.6 C) BP: 131/79 Pulse Rate: 64 SpO2: 97 %   Anthropometric Measurements Height: 5\' 6"  (1.676 m) Weight: 269 lb (122 kg) BMI (Calculated): 43.44 Weight at Last Visit: 273lb Weight Lost Since Last Visit: 0lb Weight Gained Since Last Visit: 2lb Starting Weight: 273lb Total Weight Loss (lbs): 4 lb (1.814 kg)   Body Composition  Body Fat %: 52 % Fat Mass (lbs): 140 lbs Muscle Mass (lbs): 122.6 lbs Total Body Water (lbs): 95.4 lbs Visceral Fat Rating : 18   Other Clinical Data Fasting: Yes Labs: No Today's Visit #: 44 Starting Date: 07/30/20     HPI  Chief Complaint: OBESITY  Casey is here to discuss her progress with her obesity treatment plan. She is on the keeping a food journal and adhering to recommended goals of 1500 calories and 80 protein and states she is following her eating plan approximately  5 0 % of the time. She states she is exercising 60 minutes 3 days per week.   Interval History:  Since last office visit she has gained 2 pounds.  She notes she is horrible with tracking and gets off track on the weekends.  She eats out on the weekend and tends to want to reward herself for doing good during the week.  She tries to eat around 1300 calories and around 80 grams of protein.  She is drinking water, coffee, ICE and diet soda.    She is struggling with stress eating.   Pharmacotherapy for weight loss: She is currently taking Saxenda 3 mg for medical weight loss.  Denies side effects.  Helps with polyphagia and cravings during the day but struggles in the evenings when she gets home from work and on the weekends.      Previous pharmacotherapy for medical weight loss:  Saxenda, Wegovy and Mounjaro-side effects of diarrhea and vomiting.  Overall felt that Wegovy worked best for her.      Bariatric surgery:   Patient is status post Lap Band with Dr. Simona Huh in 2008 and conversion to Sleeve Gastrectomy in 2020 by Dr. Daphine Deutscher. Her highest weight prior to surgery was 380 lbs and Her nadir weight after surgery was 211 lbs. She is taking calcium, MV with iron and Vit D 50,000 IU every 2 weeks. She reports some restriction.   PHYSICAL EXAM:  Blood pressure 131/79, pulse 64, temperature 97.9 F (36.6 C), height 5\' 6"  (1.676 m), weight 269 lb (122 kg), last menstrual period 02/15/2020, SpO2 97%. Body mass index is 43.42 kg/m.  General: She is overweight, cooperative, alert, well developed, and in no acute distress. PSYCH: Has normal mood, affect and thought process.   Extremities: No edema.  Neurologic: No gross sensory or motor deficits. No tremors or fasciculations noted.    DIAGNOSTIC DATA REVIEWED:  BMET    Component Value Date/Time   NA 142 08/08/2023 0825   K 4.8 08/08/2023 0825   CL 102 08/08/2023 0825   CO2 25 08/08/2023 0825   GLUCOSE 78 08/08/2023 0825   GLUCOSE 89 02/27/2019 0807   BUN 16 08/08/2023 0825   CREATININE 0.81 08/08/2023 0825   CALCIUM 9.9 08/08/2023 0825   GFRNONAA 76 07/30/2020 1201   GFRAA 88 07/30/2020 1201   Lab Results  Component Value Date   HGBA1C 5.4 02/01/2022   HGBA1C  5.1 07/14/2017   Lab Results  Component Value Date   INSULIN 5.2 02/01/2022   INSULIN 6.0 07/14/2017   Lab Results  Component Value Date   TSH 3.810 08/08/2023   CBC    Component Value Date/Time   WBC 6.6 08/08/2023 0825   WBC 12.7 (H) 03/05/2019 0430   RBC 5.16 08/08/2023 0825   RBC 4.57 03/05/2019 0430   HGB 15.8 08/08/2023 0825   HCT 48.1 (H) 08/08/2023 0825   PLT 262 08/08/2023 0825   MCV 93 08/08/2023 0825   MCH 30.6 08/08/2023 0825   MCH 29.3 03/05/2019 0430   MCHC 32.8 08/08/2023 0825   MCHC 31.2 03/05/2019 0430   RDW 12.8 08/08/2023 0825   Iron Studies    Component Value Date/Time   IRON 100 08/08/2023 0825   TIBC 294 07/30/2020 1201    FERRITIN 59 08/08/2023 0825   IRONPCTSAT 26 07/30/2020 1201   Lipid Panel     Component Value Date/Time   CHOL 197 08/08/2023 0825   TRIG 92 08/08/2023 0825   HDL 68 08/08/2023 0825   CHOLHDL 3.4 02/01/2022 0751   LDLCALC 113 (H) 08/08/2023 0825   Hepatic Function Panel     Component Value Date/Time   PROT 6.9 08/08/2023 0825   ALBUMIN 4.4 08/08/2023 0825   AST 20 08/08/2023 0825   ALT 22 08/08/2023 0825   ALKPHOS 127 (H) 08/08/2023 0825   BILITOT 0.5 08/08/2023 0825      Component Value Date/Time   TSH 3.810 08/08/2023 0825   Nutritional Lab Results  Component Value Date   VD25OH 69.2 08/08/2023   VD25OH 61.9 08/10/2022   VD25OH 63.2 02/01/2022     ASSESSMENT AND PLAN  TREATMENT PLAN FOR OBESITY:  Recommended Dietary Goals  Landrey is currently in the action stage of change. As such, her goal is to continue weight management plan. She has agreed to keeping a food journal and adhering to recommended goals of 1400-1500 calories and 80+ protein.  Behavioral Intervention  We discussed the following Behavioral Modification Strategies today: increasing lean protein intake to established goals, increasing water intake , work on tracking and journaling calories using tracking application, reading food labels , keeping healthy foods at home, and continue to work on maintaining a reduced calorie state, getting the recommended amount of protein, incorporating whole foods, making healthy choices, staying well hydrated and practicing mindfulness when eating..  Additional resources provided today: NA  Recommended Physical Activity Goals  Liora has been advised to work up to 150 minutes of moderate intensity aerobic activity a week and strengthening exercises 2-3 times per week for cardiovascular health, weight loss maintenance and preservation of muscle mass.   She has agreed to Think about enjoyable ways to increase daily physical activity and overcoming barriers to exercise,  Increase physical activity in their day and reduce sedentary time (increase NEAT)., and Work on scheduling and tracking physical activity.    Pharmacotherapy We discussed various medication options to help Jacob with her weight loss efforts and we both agreed to continue Saxenda 3mg .  Side effects.  ASSOCIATED CONDITIONS ADDRESSED TODAY  Action/Plan  Polyphagia -     Amb ref to Medical Nutrition Therapy-MNT -     Saxenda; Inject 3 mg into the skin daily.  Dispense: 45 mL; Refill: 0  Morbid obesity (HCC) -     Amb ref to Medical Nutrition Therapy-MNT -     Saxenda; Inject 3 mg into the skin daily.  Dispense: 45 mL; Refill:  0  BMI 40.0-44.9, adult (HCC) -     Amb ref to Medical Nutrition Therapy-MNT -     Saxenda; Inject 3 mg into the skin daily.  Dispense: 45 mL; Refill: 0      Refer to Dr. Dewaine Conger   Return in about 4 weeks (around 12/14/2023).Marland Kitchen She was informed of the importance of frequent follow up visits to maximize her success with intensive lifestyle modifications for her multiple health conditions.   ATTESTASTION STATEMENTS:  Reviewed by clinician on day of visit: allergies, medications, problem list, medical history, surgical history, family history, social history, and previous encounter notes.      Theodis Sato. Jessa Stinson FNP-C

## 2023-11-18 ENCOUNTER — Other Ambulatory Visit (HOSPITAL_BASED_OUTPATIENT_CLINIC_OR_DEPARTMENT_OTHER): Payer: Self-pay

## 2023-12-11 ENCOUNTER — Encounter: Payer: Self-pay | Admitting: Nurse Practitioner

## 2023-12-13 ENCOUNTER — Other Ambulatory Visit: Payer: Self-pay | Admitting: Nurse Practitioner

## 2023-12-13 ENCOUNTER — Other Ambulatory Visit: Payer: Self-pay

## 2023-12-13 ENCOUNTER — Other Ambulatory Visit (HOSPITAL_BASED_OUTPATIENT_CLINIC_OR_DEPARTMENT_OTHER): Payer: Self-pay

## 2023-12-13 DIAGNOSIS — R632 Polyphagia: Secondary | ICD-10-CM

## 2023-12-13 DIAGNOSIS — Z6841 Body Mass Index (BMI) 40.0 and over, adult: Secondary | ICD-10-CM

## 2023-12-14 ENCOUNTER — Other Ambulatory Visit (HOSPITAL_BASED_OUTPATIENT_CLINIC_OR_DEPARTMENT_OTHER): Payer: Self-pay

## 2023-12-18 ENCOUNTER — Telehealth (INDEPENDENT_AMBULATORY_CARE_PROVIDER_SITE_OTHER): Payer: 59 | Admitting: Psychology

## 2023-12-18 NOTE — Progress Notes (Unsigned)
Office: [phone]  /  Fax: [phone]    Date: December 18, 2023    Appointment Start Time: *** Duration: *** minutes Provider: Lawerance Cruel, Psy.D. Type of Session: Intake for Individual Therapy  Location of Patient: {gbptloc:23249} (private location) Location of Provider: Provider's home (private office) Type of Contact: Telepsychological Visit via MyChart Video Visit  Informed Consent: Prior to proceeding with today's appointment, two pieces of identifying information were obtained. In addition, Delonda's physical location at the time of this appointment was obtained as well a phone number she could be reached at in the event of technical difficulties. Marykatherine and this provider participated in today's telepsychological service.   The provider's role was explained to [employer]. The provider reviewed and discussed issues of confidentiality, privacy, and limits therein (e.g., reporting obligations). In addition to verbal informed consent, written informed consent for psychological services was obtained prior to the initial appointment. Since the clinic is not a 24/7 crisis center, mental health emergency resources were shared and this  provider explained MyChart, e-mail, voicemail, and/or other messaging systems should be utilized only for non-emergency reasons. This provider also explained that information obtained during appointments will be placed in [place]'s medical record and relevant information will be shared with other providers at [facility] at any locations for coordination of care. Therese agreed information may be shared with other [facility] providers as needed for coordination of care and by signing the service agreement document, she provided written consent for coordination of care. Prior to initiating telepsychological services, Elveta completed an informed consent document, which included the development of a safety plan (i.e., an emergency contact and  emergency resources) in the event of an emergency/crisis. Jeffrey verbally acknowledged understanding she is ultimately responsible for understanding her insurance benefits for telepsychological and in-person services. This provider also reviewed confidentiality, as it relates to telepsychological services. Malyiah  acknowledged understanding that appointments cannot be recorded without both party consent and she is aware she is responsible for securing confidentiality on her end of the session. Dejana verbally consented to proceed.  Chief Complaint/HPI: Zaryia was referred by Irene Limbo, FNP on 11/16/2023. The OV note for 11/16/2023 indicated the following: "Since last office visit she has gained 2 pounds.  She notes she is horrible with tracking and gets off track on the weekends.  She eats out on the weekend and tends to want to reward herself for doing good during the week.  She tries to eat around 1300 calories and around 80 grams of protein.  She is drinking water, coffee, ICE and diet soda.She is struggling with stress eating."  During today's appointment, Natalija was verbally administered a questionnaire assessing various behaviors related to emotional eating behaviors. Leyah endorsed the following: {gbmoodandfood:21755}. She shared she craves ***. Jilene believes the onset of emotional eating behaviors was *** and described the current frequency of emotional eating behaviors as ***. In addition, Maicy {gblegal:22371} a history of binge eating behaviors. *** Currently, Katurah indicated ***. Furthermore, Emmalin {gblegal:22371} other problems of concern. ***   Mental Status Examination:  Appearance: {Appearance:22431} Behavior: {Behavior:22445} Mood: {gbmood:21757} Affect: {Affect:22436} Speech: {Speech:22432} Eye Contact: {Eye Contact:22433} Psychomotor Activity: {Motor Activity:22434} Gait: unable to assess  Thought Process: {thought process:22448}  Thought Content/Perception: {disturbances:22451} Orientation:  {Orientation:22437} Memory/Concentration: {gbcognition:22449} Insight/Judgment: {Insight:22446}  Family & Psychosocial History: Keena reported she is *** and ***. She indicated she is currently ***. Additionally, Davina shared her highest level of education obtained is ***. Currently, Romelia's social support  system consists of her ***. Moreover, Aundraya stated she resides with her ***.   Medical History:  Past Medical History:  Diagnosis Date   Allergy    Anemia    B12 deficiency    Food allergy    Gallbladder problem    GERD (gastroesophageal reflux disease)    Gluten intolerance    Gluten intolerance    Hyperlipidemia    borderline   Hypertension    Resolved after LAGB   IBS (irritable bowel syndrome)    Joint pain    Keratoconus    Morbid obesity (HCC)    Obesity    Peeling of nails    Pneumonia    Swallowing difficulty    Swelling    feet and legs   Swelling of both lower extremities    TMJ (dislocation of temporomandibular joint)    Vitamin D deficiency    Wears glasses    White coat syndrome with diagnosis of hypertension    Past Surgical History:  Procedure Laterality Date   CHOLECYSTECTOMY  1990's   Patient unsure of date   CORNEAL TRANSPLANT  2021   LAPAROSCOPIC GASTRIC BAND REMOVAL WITH LAPAROSCOPIC GASTRIC SLEEVE RESECTION N/A 03/04/2019   Procedure: LAPAROSCOPIC GASTRIC BAND REMOVAL WITH LAPAROSCOPIC GASTRIC SLEEVE RESECTION, Upper Endo, ERAS Pathway;  Surgeon: Luretha Murphy, MD;  Location: [facility];  Service: General;  Laterality: N/A;   LAPAROSCOPIC GASTRIC BANDING  01/23/2007   PANNICULECTOMY  09/24/2012   Procedure: PANNICULECTOMY;  Surgeon: Valarie Merino, MD;  Location: [facility];  Service: General;  Laterality: N/A;   TONSILLECTOMY     1972 and adneoids   Current Outpatient Medications on File Prior to Visit  Medication Sig Dispense Refill   Calcium Carbonate-Vit D-Min (CALTRATE 600+D PLUS PO) Take by mouth.     cetirizine (ZYRTEC) 10 MG tablet Take 10 mg by  mouth daily.     DHA-EPA-Vitamin E (OMEGA-3 COMPLEX PO) Take 2 capsules by mouth 2 (two) times daily.     diphenhydrAMINE (BENADRYL) 25 MG tablet Take 25 mg by mouth at bedtime.      FIBER ADULT GUMMIES 2 g CHEW Chew 2 tablets by mouth daily.      fluticasone (FLONASE) 50 MCG/ACT nasal spray Place 2 sprays into the nose daily.      Ibuprofen 200 MG CAPS Take by mouth.     Insulin Pen Needle (B-D UF III MINI PEN NEEDLES) 31G X 5 MM MISC USE DAILY AS DIRECTED WITH SAXENDA 100 each 0   Liraglutide -Weight Management (SAXENDA) 18 MG/3ML SOPN Inject 3 mg into the skin daily. 45 mL 0   Magnesium 250 MG TABS Take 250 mg by mouth daily.      Melatonin 5 MG CAPS Take 5 mg by mouth at bedtime.     montelukast (SINGULAIR) 10 MG tablet Take 10 mg by mouth daily.      Multiple Vitamin (MULTIVITAMIN WITH MINERALS) TABS Take 1 tablet by mouth daily.     omeprazole (PRILOSEC) 20 MG capsule Take 20 mg by mouth daily.     Plant Sterols and Stanols (CHOLEST OFF PO) Take 1 capsule by mouth daily.     prednisoLONE acetate (PRED FORTE) 1 % ophthalmic suspension SMARTSIG:1 Drop(s) In Eye(s) Morning-Night     Vitamin D, Ergocalciferol, (DRISDOL) 1.25 MG (50000 UNIT) CAPS capsule Take 1 pill twice monthly 2 capsule 0   No current facility-administered medications on file prior to visit.    Mental Health History: Tomasina reported ***.  She {gblegal:22371} a history of psychotropic medications. Kaicee {Endorse or deny of item:23407} hospitalizations for psychiatric concerns. Anda {gblegal:22371} a family history of mental health/substance abuse related concerns. *** Berlene {Endorse or deny of item:23407} trauma including {gbtrauma:22071} abuse, as well as neglect. ***  Nikitia described her typical mood lately as ***. Aside from concerns noted above and endorsed on the PHQ-9 and GAD-7, [place] reported ***. Tashaya {gblegal:22371} current alcohol use. *** She {gblegal:22371} tobacco use. *** She {gblegal:22371} illicit/recreational substance  use. Furthermore, Consuela indicated she is not experiencing the following: {gbsxs:21965}. She also denied history of and current suicidal ideation, plan, and intent; history of and current homicidal ideation, plan, and intent; and history of and current engagement in self-harm.  Legal History: Elianie {Endorse or deny of item:23407} legal involvement.   Structured Assessments Results: The Patient Health Questionnaire-9 (PHQ-9) is a self-report measure that assesses symptoms and severity of depression over the course of the last two weeks. Amalia obtained a score of *** suggesting {GBPHQ9SEVERITY:21752}. Laasya finds the endorsed symptoms to be {gbphq9difficulty:21754}. [0= Not at all; 1= Several days; 2= More than half the days; 3= Nearly every day] Little interest or pleasure in doing things ***  Feeling down, depressed, or hopeless ***  Trouble falling or staying asleep, or sleeping too much ***  Feeling tired or having little energy ***  Poor appetite or overeating ***  Feeling bad about yourself --- or that you are a failure or have let yourself or your family down ***  Trouble concentrating on things, such as reading the newspaper or watching television ***  Moving or speaking so slowly that other people could have noticed? Or the opposite --- being so fidgety or restless that you have been moving around a lot more than usual ***  Thoughts that you would be better off dead or hurting yourself in some way ***  PHQ-9 Score ***    The Generalized Anxiety Disorder-7 (GAD-7) is a brief self-report measure that assesses symptoms of anxiety over the course of the last two weeks. Ellizabeth obtained a score of *** suggesting {gbgad7severity:21753}. Shanta finds the endorsed symptoms to be {gbphq9difficulty:21754}. [0= Not at all; 1= Several days; 2= Over half the days; 3= Nearly every day] Feeling nervous, anxious, on edge ***  Not being able to stop or control worrying ***  Worrying too much about different things ***   Trouble relaxing ***  Being so restless that it's hard to sit still ***  Becoming easily annoyed or irritable ***  Feeling afraid as if something awful might happen ***  GAD-7 Score ***   Interventions:  {Interventions List for Intake:23406}  Diagnostic Impressions & Provisional DSM-5 Diagnosis(es): Based on the aforementioned, the following diagnosis(es) were assigned: {Diagnoses:22752}.  Plan: Aireonna appears able and willing to participate as evidenced by engagement in reciprocal conversation and asking questions as needed for clarification. The next appointment is scheduled for *** at ***, which will be via MyChart Video Visit. The following treatment goal was established: {gbtxgoals:21759}. This provider will regularly review the treatment plan and medical chart to keep informed of status changes. Tiwana expressed understanding and agreement with the initial treatment plan of care. *** Ranita will be sent a handout via e-mail to utilize between now and the next appointment to increase awareness of hunger patterns and subsequent eating. Rainee provided verbal consent during today's appointment for this provider to send the handout via e-mail. ***

## 2024-01-01 ENCOUNTER — Telehealth (INDEPENDENT_AMBULATORY_CARE_PROVIDER_SITE_OTHER): Payer: 59 | Admitting: Psychology

## 2024-01-01 DIAGNOSIS — F5089 Other specified eating disorder: Secondary | ICD-10-CM | POA: Diagnosis not present

## 2024-01-01 NOTE — Progress Notes (Signed)
 Office: [phone]  /  Fax: [phone]    Date: January 01, 2024    Appointment Start Time: 11:58am Duration: 43 minutes Provider: Wyatt Fire, Psy.D. Type of Session: Intake for Individual Therapy  Location of Patient: Home (private location) Location of Provider: Provider's home (private office) Type of Contact: Telepsychological Visit via MyChart Video Visit  Informed Consent: Prior to proceeding with today's appointment, two pieces of identifying information were obtained. In addition, Susan Dorsey's physical location at the time of this appointment was obtained as well a phone number she could be reached at in the event of technical difficulties. Susan Dorsey and this provider participated in today's telepsychological service.   The provider's role was explained to [employer]. The provider reviewed and discussed issues of confidentiality, privacy, and limits therein (e.g., reporting obligations). In addition to verbal informed consent, written informed consent for psychological services was obtained prior to the initial appointment. Since the clinic is not a 24/7 crisis center, mental health emergency resources were shared and this  provider explained MyChart, e-mail, voicemail, and/or other messaging systems should be utilized only for non-emergency reasons. This provider also explained that information obtained during appointments will be placed in Susan Dorsey's medical record and relevant information will be shared with other providers at [facility] at any locations for coordination of care. Susan Dorsey agreed information may be shared with other [facility] providers as needed for coordination of care and by signing the service agreement document, she provided written consent for coordination of care. Prior to initiating telepsychological services, Susan Dorsey completed an informed consent document, which included the development of a safety plan (i.e., an emergency contact and emergency  resources) in the event of an emergency/crisis. Susan Dorsey verbally acknowledged understanding she is ultimately responsible for understanding her insurance benefits for telepsychological and in-person services. This provider also reviewed confidentiality, as it relates to telepsychological services. Susan Dorsey  acknowledged understanding that appointments cannot be recorded without both party consent and she is aware she is responsible for securing confidentiality on her end of the session. Susan Dorsey verbally consented to proceed.  Chief Complaint/HPI: Susan Dorsey was referred by Corean Scala, FNP on 11/16/2023. The OV note for 11/16/2023 indicated the following: Since last office visit she has gained 2 pounds.  She notes she is horrible with tracking and gets off track on the weekends.  She eats out on the weekend and tends to want to reward herself for doing good during the week.  She tries to eat around 1300 calories and around 80 grams of protein.  She is drinking water, coffee, ICE and diet soda.She is struggling with stress eating.  During today's appointment, Sholonda shared, I'm pretty healthy but need to lose weight. I'm pretty good at maintaining my weight. She was verbally administered a questionnaire assessing various behaviors related to emotional eating behaviors. Leyanna endorsed the following: overeat when you are celebrating, use food to help you cope with emotional situations, find food is comforting to you, overeat frequently when you are bored or lonely, and eat as a reward. Tyrina believes the onset of emotional eating behaviors was likely in childhood and described the current frequency of emotional eating behaviors as once a week. In addition, Bradley denied a history of binge eating behaviors. Rashawnda denied a history of significantly restricting food intake, purging and engagement in other compensatory strategies for weight loss, and has never been diagnosed with an eating disorder. She also denied a history of  treatment for emotional eating behaviors. She stated  she had lap band surgery in 2008 and a revision surgery in 2020. Currently, Susan Dorsey indicated when she get[s] bored, she will revert to prior eating habits.   Mental Status Examination:  Appearance: neat Behavior: appropriate to circumstances Mood: neutral Affect: mood congruent Speech: WNL Eye Contact: appropriate Psychomotor Activity: WNL Gait: unable to assess  Thought Process: linear, logical, and goal directed and denies suicidal, homicidal, and self-harm ideation, plan and intent  Thought Content/Perception: no hallucinations, delusions, bizarre thinking or behavior endorsed or observed Orientation: AAOx4 Memory/Concentration: intact Insight/Judgment: fair  Family & Psychosocial History: Susan Dorsey reported she is married and she does not have any children. She indicated she is currently employed with the [employer] as a scientist, clinical (histocompatibility and immunogenetics). Additionally, Susan Dorsey shared her highest level of education obtained is a bachelor's degree. Currently, Susan Dorsey's social support system consists of her husband, sister, sister in law, aunts, uncles, and cousins. Moreover, Susan Dorsey stated she resides with her husband and two dogs.   Medical History:  Past Medical History:  Diagnosis Date   Allergy    Anemia    B12 deficiency    Food allergy    Gallbladder problem    GERD (gastroesophageal reflux disease)    Gluten intolerance    Gluten intolerance    Hyperlipidemia    borderline   Hypertension    Resolved after LAGB   IBS (irritable bowel syndrome)    Joint pain    Keratoconus    Morbid obesity (HCC)    Obesity    Peeling of nails    Pneumonia    Swallowing difficulty    Swelling    feet and legs   Swelling of both lower extremities    TMJ (dislocation of temporomandibular joint)    Vitamin D  deficiency    Wears glasses    White coat syndrome with diagnosis of hypertension    Past Surgical History:  Procedure Laterality Date    CHOLECYSTECTOMY  1990's   Patient unsure of date   CORNEAL TRANSPLANT  2021   LAPAROSCOPIC GASTRIC BAND REMOVAL WITH LAPAROSCOPIC GASTRIC SLEEVE RESECTION N/A 03/04/2019   Procedure: LAPAROSCOPIC GASTRIC BAND REMOVAL WITH LAPAROSCOPIC GASTRIC SLEEVE RESECTION, Upper Endo, ERAS Pathway;  Surgeon: Gladis Cough, MD;  Location: [facility];  Service: General;  Laterality: N/A;   LAPAROSCOPIC GASTRIC BANDING  01/23/2007   PANNICULECTOMY  09/24/2012   Procedure: PANNICULECTOMY;  Surgeon: Cough KATHEE Gladis, MD;  Location: [facility];  Service: General;  Laterality: N/A;   TONSILLECTOMY     1972 and adneoids   Current Outpatient Medications on File Prior to Visit  Medication Sig Dispense Refill   Calcium Carbonate-Vit D-Min (CALTRATE 600+D PLUS PO) Take by mouth.     cetirizine  (ZYRTEC ) 10 MG tablet Take 10 mg by mouth daily.     DHA-EPA-Vitamin E (OMEGA-3 COMPLEX PO) Take 2 capsules by mouth 2 (two) times daily.     diphenhydrAMINE (BENADRYL) 25 MG tablet Take 25 mg by mouth at bedtime.      FIBER ADULT GUMMIES 2 g CHEW Chew 2 tablets by mouth daily.      fluticasone  (FLONASE ) 50 MCG/ACT nasal spray Place 2 sprays into the nose daily.      Ibuprofen 200 MG CAPS Take by mouth.     Insulin  Pen Needle (B-D UF III MINI PEN NEEDLES) 31G X 5 MM MISC USE DAILY AS DIRECTED WITH SAXENDA  100 each 0   Liraglutide  -Weight Management (SAXENDA ) 18 MG/3ML SOPN Inject 3 mg into the skin daily. 45  mL 0   Magnesium 250 MG TABS Take 250 mg by mouth daily.      Melatonin 5 MG CAPS Take 5 mg by mouth at bedtime.     montelukast (SINGULAIR) 10 MG tablet Take 10 mg by mouth daily.      Multiple Vitamin (MULTIVITAMIN WITH MINERALS) TABS Take 1 tablet by mouth daily.     omeprazole (PRILOSEC) 20 MG capsule Take 20 mg by mouth daily.     Plant Sterols and Stanols (CHOLEST OFF PO) Take 1 capsule by mouth daily.     prednisoLONE acetate (PRED FORTE) 1 % ophthalmic suspension SMARTSIG:1 Drop(s) In Eye(s) Morning-Night     Vitamin D ,  Ergocalciferol , (DRISDOL ) 1.25 MG (50000 UNIT) CAPS capsule Take 1 pill twice monthly 2 capsule 0   No current facility-administered medications on file prior to visit.  Vivien stated she is medication compliant.   Mental Health History: Susan Dorsey reported a history of therapeutic services, noting the last time was in 2020. She denied a history of psychotropic medications. Susan Dorsey reported there is no history of hospitalizations for psychiatric concerns. Susan Dorsey endorsed a family history of depression (mother) and alcohol abuse (maternal grandmother and paternal grandfather). Furthermore, Susan Dorsey reported there is no history of trauma including psychological, physical , and sexual abuse, as well as neglect.  Susan Dorsey described her typical mood lately as upbeat and happy. Susan Dorsey denied current alcohol use.  She denied tobacco use. She denied illicit/recreational substance use. Furthermore, Susan Dorsey indicated she is not experiencing the following: hallucinations and delusions, paranoia, symptoms of mania , social withdrawal, crying spells, panic attacks, memory concerns, attention and concentration issues, and obsessions and compulsions. She also denied history of and current suicidal ideation, plan, and intent; history of and current homicidal ideation, plan, and intent; and history of and current engagement in self-harm.  Legal History: Susan Dorsey reported there is no history of legal involvement.   Structured Assessments Results: The Patient Health Questionnaire-9 (PHQ-9) is a self-report measure that assesses symptoms and severity of depression over the course of the last two weeks. Susan Dorsey obtained a score of 1 suggesting minimal depression. Susan Dorsey finds the endorsed symptoms to be not difficult at all. [0= Not at all; 1= Several days; 2= More than half the days; 3= Nearly every day] Little interest or pleasure in doing things 0  Feeling down, depressed, or hopeless 0  Trouble falling or staying asleep, or sleeping too much 0  Feeling tired  or having little energy 1  Poor appetite or overeating 0  Feeling bad about yourself --- or that you are a failure or have let yourself or your family down 0  Trouble concentrating on things, such as reading the newspaper or watching television 0  Moving or speaking so slowly that other people could have noticed? Or the opposite --- being so fidgety or restless that you have been moving around a lot more than usual 0  Thoughts that you would be better off dead or hurting yourself in some way 0  PHQ-9 Score 1    The Generalized Anxiety Disorder-7 (GAD-7) is a brief self-report measure that assesses symptoms of anxiety over the course of the last two weeks. Susan Dorsey obtained a score of 0. [0= Not at all; 1= Several days; 2= Over half the days; 3= Nearly every day] Feeling nervous, anxious, on edge 0  Not being able to stop or control worrying 0  Worrying too much about different things 0  Trouble relaxing 0  Being so restless  that it's hard to sit still 0  Becoming easily annoyed or irritable 0  Feeling afraid as if something awful might happen 0  GAD-7 Score 0   Interventions:  Conducted a chart review Focused on rapport building Verbally administered PHQ-9 and GAD-7 for symptom monitoring Verbally administered Food & Mood questionnaire to assess various behaviors related to emotional eating Provided emphatic reflections and validation Psychoeducation provided regarding physical versus emotional hunger  Diagnostic Impressions & Provisional DSM-5 Diagnosis(es): Susan Dorsey disclosed a history of emotional eating behaviors starting in childhood and described current engagement as weekly. She also has a history of bariatric surgery. Susan Dorsey denied engagement in any other disordered eating behaviors. Based on the aforementioned, the following diagnosis was assigned: F50.89 Other Specified Feeding or Eating Disorder, Emotional Eating Behaviors.  Plan: Susan Dorsey appears able and willing to participate as evidenced by  engagement in reciprocal conversation and asking questions as needed for clarification. The next appointment is scheduled for 01/30/2024 at 2:30pm, which will be via MyChart Video Visit. The following treatment goal was established: increase coping skills. This provider will regularly review the treatment plan and medical chart to keep informed of status changes. Tynslee expressed understanding and agreement with the initial treatment plan of care. Vinnie will be sent a handout via e-mail to utilize between now and the next appointment to increase awareness of hunger patterns and subsequent eating. Mckinzi provided verbal consent during today's appointment for this provider to send the handout via e-mail.

## 2024-01-04 ENCOUNTER — Ambulatory Visit: Payer: 59 | Admitting: Skilled Nursing Facility1

## 2024-01-11 ENCOUNTER — Other Ambulatory Visit (HOSPITAL_BASED_OUTPATIENT_CLINIC_OR_DEPARTMENT_OTHER): Payer: Self-pay

## 2024-01-11 ENCOUNTER — Encounter: Payer: Self-pay | Admitting: Nurse Practitioner

## 2024-01-11 ENCOUNTER — Ambulatory Visit: Payer: 59 | Admitting: Nurse Practitioner

## 2024-01-11 ENCOUNTER — Other Ambulatory Visit: Payer: Self-pay

## 2024-01-11 ENCOUNTER — Telehealth: Payer: Self-pay

## 2024-01-11 VITALS — BP 135/81 | HR 67 | Temp 97.7°F | Ht 66.0 in | Wt 270.0 lb

## 2024-01-11 DIAGNOSIS — E559 Vitamin D deficiency, unspecified: Secondary | ICD-10-CM | POA: Diagnosis not present

## 2024-01-11 DIAGNOSIS — Z6841 Body Mass Index (BMI) 40.0 and over, adult: Secondary | ICD-10-CM | POA: Diagnosis not present

## 2024-01-11 DIAGNOSIS — R632 Polyphagia: Secondary | ICD-10-CM | POA: Diagnosis not present

## 2024-01-11 MED ORDER — WEGOVY 0.5 MG/0.5ML ~~LOC~~ SOAJ
0.5000 mg | SUBCUTANEOUS | 0 refills | Status: DC
  Filled 2024-01-11: qty 2, 28d supply, fill #0

## 2024-01-11 MED ORDER — VITAMIN D (ERGOCALCIFEROL) 1.25 MG (50000 UNIT) PO CAPS
ORAL_CAPSULE | ORAL | 0 refills | Status: DC
Start: 2024-01-11 — End: 2024-02-08
  Filled 2024-01-11: qty 2, 28d supply, fill #0

## 2024-01-11 MED ORDER — BD PEN NEEDLE MINI U/F 31G X 5 MM MISC
0 refills | Status: AC
Start: 2024-01-11 — End: ?
  Filled 2024-01-11: qty 100, 30d supply, fill #0

## 2024-01-11 NOTE — Progress Notes (Signed)
Office: [phone]  /  Fax: [phone]  WEIGHT SUMMARY AND BIOMETRICS  Weight Lost Since Last Visit: 0lb  Weight Gained Since Last Visit: 1lb   Vitals Temp: 97.7 F (36.5 C) BP: 135/81 Pulse Rate: 67 SpO2: 98 %   Anthropometric Measurements Height: 5\' 6"  (1.676 m) Weight: 270 lb (122.5 kg) BMI (Calculated): 43.6 Weight at Last Visit: 269lb Weight Lost Since Last Visit: 0lb Weight Gained Since Last Visit: 1lb Starting Weight: 273lb Total Weight Loss (lbs): 3 lb (1.361 kg)   Body Composition  Body Fat %: 52.3 % Fat Mass (lbs): 141.6 lbs Muscle Mass (lbs): 122.6 lbs Total Body Water (lbs): 94.2 lbs Visceral Fat Rating : 18   Other Clinical Data Fasting: Yes Labs: No Today's Visit #: 45 Starting Date: 07/30/20     HPI  Chief Complaint: OBESITY  Susan Dorsey is here to discuss her progress with her obesity treatment plan. She is on the keeping a food journal and adhering to recommended goals of 1500 calories and 80 protein and states she is following her eating plan approximately 60 % of the time. She states she is exercising 60 minutes 3 days per week.   Interval History:  Since last office visit she has gained 1 pound.  She saw Dr. Dewaine Conger last on 01/01/24 and has a follow up appt on 02/05/24.  She started following a meal plan Jan 2nd (scanned in chart).  She's aiming to eat 80-100 grams of protein daily.  She feels she is horrible at tracking.  She plans to eat 6 small meals per day. She is drinking water and plans to go to the gym 3 days per week.         Pharmacotherapy for weight loss: She is currently taking Saxenda 3 mg for medical weight loss.  Denies side effects.       Previous pharmacotherapy for medical weight loss:  She has tried [place], [place] and Mounjaro-side effects of diarrhea and vomiting.  Overall felt that Wegovy worked best for her.  She feels the reason she stalled with weight loss with Reginal Lutes was due to stress: caring for her mother in law  after her hip replacement, her dog died and she had surgery herself.  She was under a lot of stress at that time but now feels that she will be able to get back on track and follow the plan. Her stress has decreased significantly and she will be able to focus on herself.    Bariatric surgery:   Patient is status post Lap Band with Dr. Simona Huh in 2008 and conversion to Sleeve Gastrectomy in 2020 by Dr. Daphine Deutscher. Her highest weight prior to surgery was 380 lbs and Her nadir weight after surgery was 211 lbs. She is taking calcium, MV with iron and Vit D 50,000 IU every 2 weeks. She reports some restriction.  Vit D deficiency  She is taking Vit D 50,000 international units  every 2 weeks.  Denies side effects.  Denies nausea, vomiting or muscle weakness.    Lab Results  Component Value Date   VD25OH 69.2 08/08/2023   VD25OH 61.9 08/10/2022   VD25OH 63.2 02/01/2022     PHYSICAL EXAM:  Blood pressure 135/81, pulse 67, temperature 97.7 F (36.5 C), height 5\' 6"  (1.676 m), weight 270 lb (122.5 kg), last menstrual period 02/15/2020, SpO2 98%. Body mass index is 43.58 kg/m.  General: She is overweight, cooperative, alert, well developed, and in no acute distress. PSYCH: Has normal mood, affect and  thought process.   Extremities: No edema.  Neurologic: No gross sensory or motor deficits. No tremors or fasciculations noted.    DIAGNOSTIC DATA REVIEWED:  BMET    Component Value Date/Time   NA 142 08/08/2023 0825   K 4.8 08/08/2023 0825   CL 102 08/08/2023 0825   CO2 25 08/08/2023 0825   GLUCOSE 78 08/08/2023 0825   GLUCOSE 89 02/27/2019 0807   BUN 16 08/08/2023 0825   CREATININE 0.81 08/08/2023 0825   CALCIUM 9.9 08/08/2023 0825   GFRNONAA 76 07/30/2020 1201   GFRAA 88 07/30/2020 1201   Lab Results  Component Value Date   HGBA1C 5.4 02/01/2022   HGBA1C 5.1 07/14/2017   Lab Results  Component Value Date   INSULIN 5.2 02/01/2022   INSULIN 6.0 07/14/2017   Lab Results  Component  Value Date   TSH 3.810 08/08/2023   CBC    Component Value Date/Time   WBC 6.6 08/08/2023 0825   WBC 12.7 (H) 03/05/2019 0430   RBC 5.16 08/08/2023 0825   RBC 4.57 03/05/2019 0430   HGB 15.8 08/08/2023 0825   HCT 48.1 (H) 08/08/2023 0825   PLT 262 08/08/2023 0825   MCV 93 08/08/2023 0825   MCH 30.6 08/08/2023 0825   MCH 29.3 03/05/2019 0430   MCHC 32.8 08/08/2023 0825   MCHC 31.2 03/05/2019 0430   RDW 12.8 08/08/2023 0825   Iron Studies    Component Value Date/Time   IRON 100 08/08/2023 0825   TIBC 294 07/30/2020 1201   FERRITIN 59 08/08/2023 0825   IRONPCTSAT 26 07/30/2020 1201   Lipid Panel     Component Value Date/Time   CHOL 197 08/08/2023 0825   TRIG 92 08/08/2023 0825   HDL 68 08/08/2023 0825   CHOLHDL 3.4 02/01/2022 0751   LDLCALC 113 (H) 08/08/2023 0825   Hepatic Function Panel     Component Value Date/Time   PROT 6.9 08/08/2023 0825   ALBUMIN 4.4 08/08/2023 0825   AST 20 08/08/2023 0825   ALT 22 08/08/2023 0825   ALKPHOS 127 (H) 08/08/2023 0825   BILITOT 0.5 08/08/2023 0825      Component Value Date/Time   TSH 3.810 08/08/2023 0825   Nutritional Lab Results  Component Value Date   VD25OH 69.2 08/08/2023   VD25OH 61.9 08/10/2022   VD25OH 63.2 02/01/2022     ASSESSMENT AND PLAN  TREATMENT PLAN FOR OBESITY:  Recommended Dietary Goals  Susan Dorsey is currently in the action stage of change. As such, her goal is to continue weight management plan. She has agreed to keeping a food journal and adhering to recommended goals of 1500 calories and 100+ protein.  Behavioral Intervention  We discussed the following Behavioral Modification Strategies today: increasing lean protein intake to established goals, decreasing simple carbohydrates , increasing vegetables, increasing water intake , work on meal planning and preparation, reading food labels , keeping healthy foods at home, and continue to work on maintaining a reduced calorie state, getting the  recommended amount of protein, incorporating whole foods, making healthy choices, staying well hydrated and practicing mindfulness when eating..  Additional resources provided today: NA  Recommended Physical Activity Goals  Susan Dorsey has been advised to work up to 150 minutes of moderate intensity aerobic activity a week and strengthening exercises 2-3 times per week for cardiovascular health, weight loss maintenance and preservation of muscle mass.   She has agreed to Think about enjoyable ways to increase daily physical activity and overcoming barriers to exercise, Increase  physical activity in their day and reduce sedentary time (increase NEAT)., Increase the intensity, frequency or duration of strengthening exercises , and Increase the intensity, frequency or duration of aerobic exercises     Pharmacotherapy We discussed various medication options to help Susan Dorsey with her weight loss efforts and we both agreed to start [facility] 0.50mg .  Side effects discussed.  Will stop Saxenda once she starts [place].  ASSOCIATED CONDITIONS ADDRESSED TODAY  Action/Plan  Polyphagia -     BD Pen Needle Mini U/F; USE DAILY AS DIRECTED WITH SAXENDA  Dispense: 30 each; Refill: 0 -     Wegovy; Inject 0.5 mg into the skin once a week.  Dispense: 2 mL; Refill: 0  Vitamin D deficiency -     Vitamin D (Ergocalciferol); Take 1 pill twice monthly  Dispense: 2 capsule; Refill: 0  Morbid obesity (HCC) -     BD Pen Needle Mini U/F; USE DAILY AS DIRECTED WITH SAXENDA  Dispense: 30 each; Refill: 0 -     Wegovy; Inject 0.5 mg into the skin once a week.  Dispense: 2 mL; Refill: 0  BMI 40.0-44.9, adult (HCC) -     BD Pen Needle Mini U/F; USE DAILY AS DIRECTED WITH SAXENDA  Dispense: 30 each; Refill: 0 -     Wegovy; Inject 0.5 mg into the skin once a week.  Dispense: 2 mL; Refill: 0     Keep appt with RD next week    Return in about 4 weeks (around 02/08/2024).Marland Kitchen She was informed of the importance of frequent follow up  visits to maximize her success with intensive lifestyle modifications for her multiple health conditions.   ATTESTASTION STATEMENTS:  Reviewed by clinician on day of visit: allergies, medications, problem list, medical history, surgical history, family history, social history, and previous encounter notes.     Susan Dorsey. Devoiry Corriher FNP-C

## 2024-01-11 NOTE — Telephone Encounter (Signed)
Received fax from [employer] that Susan Dorsey has been denied.

## 2024-01-11 NOTE — Telephone Encounter (Signed)
PA submitted through Cover My Meds for [facility]. Awaiting insurance determination. Key: [id number]

## 2024-01-15 ENCOUNTER — Telehealth: Payer: Self-pay

## 2024-01-15 ENCOUNTER — Other Ambulatory Visit: Payer: Self-pay | Admitting: Nurse Practitioner

## 2024-01-15 ENCOUNTER — Encounter: Payer: Self-pay | Admitting: Nurse Practitioner

## 2024-01-15 ENCOUNTER — Other Ambulatory Visit (HOSPITAL_BASED_OUTPATIENT_CLINIC_OR_DEPARTMENT_OTHER): Payer: Self-pay

## 2024-01-15 MED ORDER — ZEPBOUND 2.5 MG/0.5ML ~~LOC~~ SOAJ
2.5000 mg | SUBCUTANEOUS | 0 refills | Status: DC
  Filled 2024-01-15: qty 2, 28d supply, fill #0

## 2024-01-15 NOTE — Telephone Encounter (Signed)
Request Reference Number: [id number]. ZEPBOUND INJ 2.5MG  is approved through 07/14/2024. Your patient may now fill this prescription and it will be covered.  Patient was already informed through her insurance.

## 2024-01-15 NOTE — Telephone Encounter (Signed)
PA submitted through Cover My Meds for Zepbound. Awaiting insurance determination. Key: [id number]

## 2024-01-16 ENCOUNTER — Ambulatory Visit: Payer: 59 | Admitting: Skilled Nursing Facility1

## 2024-01-16 ENCOUNTER — Other Ambulatory Visit (HOSPITAL_BASED_OUTPATIENT_CLINIC_OR_DEPARTMENT_OTHER): Payer: Self-pay

## 2024-01-17 ENCOUNTER — Encounter: Payer: 59 | Attending: Nurse Practitioner | Admitting: Skilled Nursing Facility1

## 2024-01-17 ENCOUNTER — Encounter: Payer: Self-pay | Admitting: Skilled Nursing Facility1

## 2024-01-17 NOTE — Progress Notes (Signed)
Medical Nutrition Therapy  Appointment Start time:  2:00  Appointment End time:  2:35  Primary concerns today: healthy lifestyle leading to weight loss  Referral diagnosis: [id number]   NUTRITION ASSESSMENT    Clinical Medical Hx: HTN Medications: see list Labs: WNL Notable Signs/Symptoms: none reported   Lifestyle & Dietary Hx  Pt states she is working with healthy weight and wellness.  Pt states she is motivated to lose weight due to her knee pain. Pt states she knows she needs to eat 1300-1500 calories per day.  Pt states her Mother in law cooks on Sundays and cannot change that. Pt states she is a boredom and celebration eater trying craft and to distract herself.    Estimated daily fluid intake: 64+ oz Supplements: multivitamin  Sleep:  Stress / self-care:  Current average weekly physical activity: 3 days a week  24-Hr Dietary Recall: unable to report as focus was on her behaviors First Meal:  Snack:  Second Meal:  Snack:  Third Meal:  Snack:  Beverages:   Estimated Energy Needs Calories: 1300-1500   NUTRITION INTERVENTION  Nutrition education (E-1) on the following topics:  Creation of balanced and diverse meals to increase the intake of nutrient-rich foods that provide essential vitamins, minerals, fiber, and phytonutrients  Variety of Fruits and Vegetables:  Aim for a colorful array of fruits and vegetables to ensure a wide range of nutrients. Include a mix of leafy greens, berries, citrus fruits, cruciferous vegetables, and more. Whole Grains: Choose whole grains over refined grains. Examples include brown rice, quinoa, oats, whole wheat, and barley. Lean Proteins: Include lean sources of protein, such as poultry, fish, tofu, legumes, beans, lentils, and low-fat dairy products. Limit red and processed meats. Healthy Fats: Incorporate sources of healthy fats, including avocados, nuts, seeds, and olive oil. Limit saturated and trans fats found in fried and  processed foods. Dairy or Dairy Alternatives: Choose low-fat or fat-free dairy products, or plant-based alternatives like almond or soy milk. Portion Control: Be mindful of portion sizes to avoid overeating. Pay attention to hunger and satisfaction cues. Limit Added Sugars: Minimize the consumption of sugary beverages, snacks, and desserts. Check food labels for added sugars and opt for natural sources of sweetness such as whole fruits. Hydration: Drink plenty of water throughout the day. Limit sugary drinks and excessive caffeine intake. Moderate Sodium Intake: Reduce the consumption of high-sodium foods. Use herbs and spices for flavor instead of excessive salt. Meal Planning and Preparation: Plan and prepare meals ahead of time to make healthier choices more convenient. Include a mix of food groups in each meal. Limit Processed Foods: Minimize the intake of highly processed and packaged foods that are often high in added sugars, salt, and unhealthy fats. Regular Physical Activity: Combine a healthy diet with regular physical activity for overall well-being. Aim for at least 150 minutes of moderate-intensity aerobic exercise per week, along with strength training. Moderation and Balance: Enjoy treats and indulgent foods in moderation, emphasizing balance rather than strict restriction.  Handouts: Needs Feelings Mindful Meals  Learning Style & Readiness for Change Teaching method utilized: Visual & Auditory  Demonstrated degree of understanding via: Teach Back  Barriers to learning/adherence to lifestyle change: relationship with food    MONITORING & EVALUATION Dietary intake, weekly physical activity  Next Steps  Patient is to call or email with any future questions or concerns.

## 2024-01-30 ENCOUNTER — Telehealth (INDEPENDENT_AMBULATORY_CARE_PROVIDER_SITE_OTHER): Payer: 59 | Admitting: Psychology

## 2024-02-05 ENCOUNTER — Telehealth (INDEPENDENT_AMBULATORY_CARE_PROVIDER_SITE_OTHER): Payer: 59 | Admitting: Psychology

## 2024-02-05 DIAGNOSIS — F5089 Other specified eating disorder: Secondary | ICD-10-CM

## 2024-02-05 NOTE — Progress Notes (Signed)
  Office: [phone]  /  Fax: [phone]    Date: February 05, 2024  Appointment Start Time: 4:01pm Duration: 29 minutes Provider: Catherene Close, Psy.D. Type of Session: Individual Therapy  Location of Patient: Home (private location) Location of Provider: Provider's Home (private office) Type of Contact: Telepsychological Visit via MyChart Video Visit  Session Content: Susan Dorsey is a 63 y.o. female presenting for a follow-up appointment to address the previously established treatment goal of increasing coping skills.Today's appointment was a telepsychological visit. Susan Dorsey provided verbal consent for today's telepsychological appointment and she is aware she is responsible for securing confidentiality on her end of the session. Prior to proceeding with today's appointment, Susan Dorsey's physical location at the time of this appointment was obtained as well a phone number she could be reached at in the event of technical difficulties. Susan Dorsey and this provider participated in today's telepsychological service.   This provider conducted a brief check-in. Susan Dorsey shared about four recent losses. Reviewed recent eating habits. Psychoeducation regarding triggers for emotional eating was provided. Susan Dorsey was provided a handout, and encouraged to utilize the handout between now and the next appointment to increase awareness of triggers and frequency. Susan Dorsey agreed. This provider also discussed behavioral strategies for specific triggers. Susan Dorsey provided verbal consent during today's appointment for this provider to send a handout about triggers via e-mail. Overall, Susan Dorsey was receptive to today's appointment as evidenced by openness to sharing, responsiveness to feedback, and willingness to explore triggers for emotional eating.  Mental Status Examination:  Appearance: neat Behavior: appropriate to circumstances Mood: neutral Affect: mood congruent Speech: WNL Eye Contact: appropriate Psychomotor Activity: WNL Gait: unable to  assess Thought Process: linear, logical, and goal directed and no evidence or endorsement of suicidal, homicidal, and self-harm ideation, plan and intent  Thought Content/Perception: no hallucinations, delusions, bizarre thinking or behavior endorsed or observed Orientation: AAOx4 Memory/Concentration: intact Insight: good Judgment: good  Interventions:  Conducted a brief chart review Provided empathic reflections and validation Reviewed content from the previous session Provided positive reinforcement Employed supportive psychotherapy interventions to facilitate reduced distress and to improve coping skills with identified stressors Psychoeducation provided regarding triggers for emotional eating behaviors  DSM-5 Diagnosis(es): F50.89 Other Specified Feeding or Eating Disorder, Emotional Eating Behaviors  Treatment Goal & Progress: During the initial appointment with this provider, the following treatment goal was established: increase coping skills. Progress is limited, as Susan Dorsey has just begun treatment with this provider; however, she is receptive to the interaction and interventions and rapport is being established.   Plan: The next appointment is scheduled for 02/27/2024 at 12pm, which will be via MyChart Video Visit. The next session will focus on working towards the established treatment goal.   Catherene Close, PsyD

## 2024-02-08 ENCOUNTER — Other Ambulatory Visit (HOSPITAL_BASED_OUTPATIENT_CLINIC_OR_DEPARTMENT_OTHER): Payer: Self-pay

## 2024-02-08 ENCOUNTER — Ambulatory Visit: Payer: 59 | Admitting: Nurse Practitioner

## 2024-02-08 ENCOUNTER — Encounter: Payer: Self-pay | Admitting: Nurse Practitioner

## 2024-02-08 ENCOUNTER — Other Ambulatory Visit: Payer: Self-pay

## 2024-02-08 VITALS — BP 144/84 | HR 71 | Temp 97.7°F | Ht 66.0 in | Wt 270.0 lb

## 2024-02-08 DIAGNOSIS — R632 Polyphagia: Secondary | ICD-10-CM | POA: Diagnosis not present

## 2024-02-08 DIAGNOSIS — E66813 Obesity, class 3: Secondary | ICD-10-CM | POA: Diagnosis not present

## 2024-02-08 DIAGNOSIS — E559 Vitamin D deficiency, unspecified: Secondary | ICD-10-CM

## 2024-02-08 DIAGNOSIS — Z6841 Body Mass Index (BMI) 40.0 and over, adult: Secondary | ICD-10-CM

## 2024-02-08 MED ORDER — ZEPBOUND 5 MG/0.5ML ~~LOC~~ SOAJ
5.0000 mg | SUBCUTANEOUS | 0 refills | Status: DC
Start: 2024-02-08 — End: 2024-03-07
  Filled 2024-02-08: qty 2, 28d supply, fill #0

## 2024-02-08 MED ORDER — VITAMIN D (ERGOCALCIFEROL) 1.25 MG (50000 UNIT) PO CAPS
ORAL_CAPSULE | ORAL | 0 refills | Status: DC
  Filled 2024-02-08: qty 2, 28d supply, fill #0

## 2024-02-08 NOTE — Progress Notes (Signed)
Office: [phone]  /  Fax: [phone]  WEIGHT SUMMARY AND BIOMETRICS  Weight Lost Since Last Visit: 0lb  Weight Gained Since Last Visit: 0lb   Vitals Temp: 97.7 F (36.5 C) BP: (!) 144/84 Pulse Rate: 71 SpO2: 100 %   Anthropometric Measurements Height: 5\' 6"  (1.676 m) Weight: 270 lb (122.5 kg) BMI (Calculated): 43.6 Weight at Last Visit: 270lb Weight Lost Since Last Visit: 0lb Weight Gained Since Last Visit: 0lb Starting Weight: 273lb Total Weight Loss (lbs): 3 lb (1.361 kg)   Body Composition  Body Fat %: 52.7 % Fat Mass (lbs): 142.4 lbs Muscle Mass (lbs): 121.4 lbs Total Body Water (lbs): 96.4 lbs Visceral Fat Rating : 18   Other Clinical Data Fasting: Yes Labs: No Today's Visit #: 1 Starting Date: 07/30/20     HPI  Chief Complaint: OBESITY  Susan Dorsey is here to discuss her progress with her obesity treatment plan. She is on the keeping a food journal and adhering to recommended goals of 1500 calories and 80 protein and states she is following her eating plan approximately 60 % of the time. She states she is exercising 60 minutes 3 days per week.   Interval History:  Since last office visit she has maintained her weight. She went to [place] since her last visit.  She saw RD last on 01/17/24 and Dr. Dewaine Conger last on 02/05/24.  She is not currently tracking her calories/macros. She doesn't feel that "tracking it's my thing".  She is meal prepping.  She is drinking water, soda, sparkling water, decaf tea and coffee.   Pharmacotherapy for weight loss: She is currently taking Zepbound 2.5 mg (x 3 doses) for medical weight loss.  Denies side effects.      ZEPBOUND INJ 2.5MG  is approved through 07/14/2024.    Previous pharmacotherapy for medical weight loss:   She has tried [place], [place] and Mounjaro-side effects of diarrhea and vomiting.  Overall felt that Wegovy worked best for her.     Bariatric surgery:   Patient is status post Lap Band with Dr. Simona Huh in  2008 and conversion to Sleeve Gastrectomy in 2020 by Dr. Daphine Deutscher. Her highest weight prior to surgery was 380 lbs and Her nadir weight after surgery was 211 lbs. She is taking calcium, MV with iron and Vit D 50,000 IU every 2 weeks. She reports some restriction.  Vit D deficiency  She is taking Vit D 50,000 IU every 2 weeks.  Denies side effects.  Denies nausea, vomiting or muscle weakness.    Lab Results  Component Value Date   VD25OH 69.2 08/08/2023   VD25OH 61.9 08/10/2022   VD25OH 63.2 02/01/2022      PHYSICAL EXAM:  Blood pressure (!) 144/84, pulse 71, temperature 97.7 F (36.5 C), height 5\' 6"  (1.676 m), weight 270 lb (122.5 kg), last menstrual period 02/15/2020, SpO2 100%. Body mass index is 43.58 kg/m.  General: She is overweight, cooperative, alert, well developed, and in no acute distress. PSYCH: Has normal mood, affect and thought process.   Extremities: No edema.  Neurologic: No gross sensory or motor deficits. No tremors or fasciculations noted.    DIAGNOSTIC DATA REVIEWED:  BMET    Component Value Date/Time   NA 142 08/08/2023 0825   K 4.8 08/08/2023 0825   CL 102 08/08/2023 0825   CO2 25 08/08/2023 0825   GLUCOSE 78 08/08/2023 0825   GLUCOSE 89 02/27/2019 0807   BUN 16 08/08/2023 0825   CREATININE 0.81 08/08/2023 0825  CALCIUM 9.9 08/08/2023 0825   GFRNONAA 76 07/30/2020 1201   GFRAA 88 07/30/2020 1201   Lab Results  Component Value Date   HGBA1C 5.4 02/01/2022   HGBA1C 5.1 07/14/2017   Lab Results  Component Value Date   INSULIN 5.2 02/01/2022   INSULIN 6.0 07/14/2017   Lab Results  Component Value Date   TSH 3.810 08/08/2023   CBC    Component Value Date/Time   WBC 6.6 08/08/2023 0825   WBC 12.7 (H) 03/05/2019 0430   RBC 5.16 08/08/2023 0825   RBC 4.57 03/05/2019 0430   HGB 15.8 08/08/2023 0825   HCT 48.1 (H) 08/08/2023 0825   PLT 262 08/08/2023 0825   MCV 93 08/08/2023 0825   MCH 30.6 08/08/2023 0825   MCH 29.3 03/05/2019 0430    MCHC 32.8 08/08/2023 0825   MCHC 31.2 03/05/2019 0430   RDW 12.8 08/08/2023 0825   Iron Studies    Component Value Date/Time   IRON 100 08/08/2023 0825   TIBC 294 07/30/2020 1201   FERRITIN 59 08/08/2023 0825   IRONPCTSAT 26 07/30/2020 1201   Lipid Panel     Component Value Date/Time   CHOL 197 08/08/2023 0825   TRIG 92 08/08/2023 0825   HDL 68 08/08/2023 0825   CHOLHDL 3.4 02/01/2022 0751   LDLCALC 113 (H) 08/08/2023 0825   Hepatic Function Panel     Component Value Date/Time   PROT 6.9 08/08/2023 0825   ALBUMIN 4.4 08/08/2023 0825   AST 20 08/08/2023 0825   ALT 22 08/08/2023 0825   ALKPHOS 127 (H) 08/08/2023 0825   BILITOT 0.5 08/08/2023 0825      Component Value Date/Time   TSH 3.810 08/08/2023 0825   Nutritional Lab Results  Component Value Date   VD25OH 69.2 08/08/2023   VD25OH 61.9 08/10/2022   VD25OH 63.2 02/01/2022     ASSESSMENT AND PLAN  TREATMENT PLAN FOR OBESITY:  Recommended Dietary Goals  Susan Dorsey is currently in the action stage of change. As such, her goal is to continue weight management plan. She has agreed to keeping a food journal and adhering to recommended goals of 1500 calories and 100+ grams protein.  Behavioral Intervention  We discussed the following Behavioral Modification Strategies today: increasing lean protein intake to established goals, decreasing simple carbohydrates , increasing vegetables, increasing fiber rich foods, increasing water intake , practice mindfulness eating and understand the difference between hunger signals and cravings, work on managing stress, creating time for self-care and relaxation, continue to practice mindfulness when eating, planning for success, and continue to work on maintaining a reduced calorie state, getting the recommended amount of protein, incorporating whole foods, making healthy choices, staying well hydrated and practicing mindfulness when eating..  Additional resources provided today:  Homemade seasoning.    Recommended Physical Activity Goals  Susan Dorsey has been advised to work up to 150 minutes of moderate intensity aerobic activity a week and strengthening exercises 2-3 times per week for cardiovascular health, weight loss maintenance and preservation of muscle mass.   She has agreed to Think about enjoyable ways to increase daily physical activity and overcoming barriers to exercise, Increase physical activity in their day and reduce sedentary time (increase NEAT)., Increase the intensity, frequency or duration of strengthening exercises , and Increase the intensity, frequency or duration of aerobic exercises     Pharmacotherapy We discussed various medication options to help Rumaysa with her weight loss efforts and we both agreed to continue Zepbound 2.5mg  x 1 more  week and then increase to 5mg . Side effects discussed  ASSOCIATED CONDITIONS ADDRESSED TODAY  Action/Plan  Vitamin D deficiency -     Vitamin D (Ergocalciferol); Take 1 tablet twice monthly  Dispense: 2 capsule; Refill: 0  Polyphagia -     Zepbound; Inject 5 mg into the skin once a week.  Dispense: 2 mL; Refill: 0  Class 3 severe obesity due to excess calories with serious comorbidity and body mass index (BMI) of 40.0 to 44.9 in adult (HCC) -     Zepbound; Inject 5 mg into the skin once a week.  Dispense: 2 mL; Refill: 0     Keep follow up appt with Dr. Dewaine Conger and follow up with RD     Return in about 4 weeks (around 03/07/2024).Marland Kitchen She was informed of the importance of frequent follow up visits to maximize her success with intensive lifestyle modifications for her multiple health conditions.   ATTESTASTION STATEMENTS:  Reviewed by clinician on day of visit: allergies, medications, problem list, medical history, surgical history, family history, social history, and previous encounter notes.   Theodis Sato. Lucillie Kiesel FNP-C

## 2024-02-27 ENCOUNTER — Telehealth (INDEPENDENT_AMBULATORY_CARE_PROVIDER_SITE_OTHER): Payer: 59 | Admitting: Psychology

## 2024-02-27 DIAGNOSIS — F5089 Other specified eating disorder: Secondary | ICD-10-CM

## 2024-02-27 NOTE — Progress Notes (Signed)
  Office: [phone]  /  Fax: [phone]    Date: February 27, 2024  Appointment Start Time: 11:59am Duration: 21 minutes Provider: Lawerance Cruel, Psy.D. Type of Session: Individual Therapy  Location of Patient: Work (private location) Location of Provider: Provider's Home (private office) Type of Contact: Telepsychological Visit via MyChart Video Visit  Session Content: Susan Dorsey is a 63 y.o. female presenting for a follow-up appointment to address the previously established treatment goal of increasing coping skills.Today's appointment was a telepsychological visit. Susan Dorsey provided verbal consent for today's telepsychological appointment and she is aware she is responsible for securing confidentiality on her end of the session. Prior to proceeding with today's appointment, Susan Dorsey's physical location at the time of this appointment was obtained as well a phone number she could be reached at in the event of technical difficulties. Susan Dorsey and this provider participated in today's telepsychological service.   This provider conducted a brief check-in. Susan Dorsey reported reflecting on the last appointment with this provider. Reviewed triggers for emotional eating behaviors. Susan Dorsey was engaged in problem solving to develop a plan to help cope with urges/cravings involving activities to relax, activities to distract, comforting places, people to call and connect with, and activities that help soothe senses. She was observed writing the plan. Overall, [name] was receptive to today's appointment as evidenced by openness to sharing, responsiveness to feedback, and willingness to implement discussed strategies .  Mental Status Examination:  Appearance: neat Behavior: appropriate to circumstances Mood: neutral Affect: mood congruent Speech: WNL Eye Contact: appropriate Psychomotor Activity: WNL Gait: unable to assess Thought Process: linear, logical, and goal directed and no evidence or endorsement of suicidal, homicidal, and  self-harm ideation, plan and intent  Thought Content/Perception: no hallucinations, delusions, bizarre thinking or behavior endorsed or observed Orientation: AAOx4 Memory/Concentration: intact Insight: good Judgment: good   Interventions:  Conducted a brief chart review Provided empathic reflections and validation Reviewed content from the previous session Provided positive reinforcement Employed supportive psychotherapy interventions to facilitate reduced distress and to improve coping skills with identified stressors Engaged patient in problem solving  DSM-5 Diagnosis(es): F50.89 Other Specified Feeding or Eating Disorder, Emotional Eating Behaviors  Treatment Goal & Progress: During the initial appointment with this provider, the following treatment goal was established: increase coping skills. Susan Dorsey has demonstrated progress in her goal as evidenced by increased awareness of hunger patterns and increased awareness of triggers for emotional eating behaviors. Susan Dorsey also continues to demonstrate willingness to engage in learned skill(s).  Plan: Due to Susan Dorsey upcoming work trip, the next appointment is scheduled for 4/12025 at 2pm, which will be via MyChart Video Visit. The next session will focus on working towards the established treatment goal.   Lawerance Cruel, PsyD

## 2024-03-07 ENCOUNTER — Other Ambulatory Visit (HOSPITAL_BASED_OUTPATIENT_CLINIC_OR_DEPARTMENT_OTHER): Payer: Self-pay

## 2024-03-07 ENCOUNTER — Encounter: Payer: Self-pay | Admitting: Nurse Practitioner

## 2024-03-07 ENCOUNTER — Ambulatory Visit: Payer: 59 | Admitting: Nurse Practitioner

## 2024-03-07 VITALS — BP 148/92 | HR 65 | Temp 98.0°F | Ht 66.0 in | Wt 269.0 lb

## 2024-03-07 DIAGNOSIS — E66813 Obesity, class 3: Secondary | ICD-10-CM

## 2024-03-07 DIAGNOSIS — R632 Polyphagia: Secondary | ICD-10-CM | POA: Diagnosis not present

## 2024-03-07 DIAGNOSIS — Z6841 Body Mass Index (BMI) 40.0 and over, adult: Secondary | ICD-10-CM | POA: Diagnosis not present

## 2024-03-07 DIAGNOSIS — E559 Vitamin D deficiency, unspecified: Secondary | ICD-10-CM

## 2024-03-07 MED ORDER — ZEPBOUND 7.5 MG/0.5ML ~~LOC~~ SOAJ
7.5000 mg | SUBCUTANEOUS | 0 refills | Status: DC
  Filled 2024-03-07 – 2024-03-09 (×2): qty 2, 28d supply, fill #0

## 2024-03-07 MED ORDER — VITAMIN D (ERGOCALCIFEROL) 1.25 MG (50000 UNIT) PO CAPS
ORAL_CAPSULE | ORAL | 0 refills | Status: DC
  Filled 2024-03-07: qty 2, 28d supply, fill #0

## 2024-03-07 NOTE — Progress Notes (Signed)
 Office: [phone]  /  Fax: [phone]  WEIGHT SUMMARY AND BIOMETRICS  Weight Lost Since Last Visit: 1lb  Weight Gained Since Last Visit: 0lb   Vitals Temp: 98 F (36.7 C) BP: (!) 148/92 Pulse Rate: 65 SpO2: 100 %   Anthropometric Measurements Height: 5\' 6"  (1.676 m) Weight: 269 lb (122 kg) BMI (Calculated): 43.44 Weight at Last Visit: 270lb Weight Lost Since Last Visit: 1lb Weight Gained Since Last Visit: 0lb Starting Weight: 273lb Total Weight Loss (lbs): 4 lb (1.814 kg)   Body Composition  Body Fat %: 52.7 % Fat Mass (lbs): 142.2 lbs Muscle Mass (lbs): 121.2 lbs Total Body Water (lbs): 96.6 lbs Visceral Fat Rating : 18   Other Clinical Data Fasting: No Labs: No Today's Visit #: 13 Starting Date: 07/30/20     HPI  Chief Complaint: OBESITY  Susan Dorsey is here to discuss her progress with her obesity treatment plan. She is on the keeping a food journal and adhering to recommended goals of 1500 calories and 100 protein and states she is following her eating plan approximately 65 % of the time. She states she is exercising 60 minutes 3 days per week.   Interval History:  Since last office visit she has lost 1 pound. She struggles with night eating and has been crafting and walking her dog to help with snacking.  She had cataract surgery on 02/23/24 and is scheduled for a colonoscopy on 02/13/24.  She has been taking her Zepbound every 10 days due to surgery and upcoming colonoscopy.  She is working out with a Systems analyst 2 days per week, walking one day per week and 2 classes 3 days per week.    She saw Dr. Dewaine Conger last on 02/27/24 and has an appt scheduled on 03/26/24   Pharmacotherapy for weight loss: She is currently taking Zepbound 5mg  (x 2 doses) for medical weight loss.  Denies side effects.    ZEPBOUND INJ 2.5MG  is approved through 07/14/2024.    Previous pharmacotherapy for medical weight loss:   She has tried [place], [place] and Mounjaro-side  effects of diarrhea and vomiting.  Overall felt that Wegovy worked best for her.     Bariatric surgery:   Patient is status post Lap Band with Dr. Simona Huh in 2008 and conversion to Sleeve Gastrectomy in 2020 by Dr. Daphine Deutscher. Her highest weight prior to surgery was 380 lbs and Her nadir weight after surgery was 211 lbs. She is taking calcium, MV with iron and Vit D 50,000 IU every 2 weeks. She reports some restriction.   Vit D deficiency  She is taking Vit D 50,000 IU every 2 weeks.  Denies side effects.  Denies nausea, vomiting or muscle weakness.    Lab Results  Component Value Date   VD25OH 69.2 08/08/2023   VD25OH 61.9 08/10/2022   VD25OH 63.2 02/01/2022     PHYSICAL EXAM:  Blood pressure (!) 148/92, pulse 65, temperature 98 F (36.7 C), height 5\' 6"  (1.676 m), weight 269 lb (122 kg), last menstrual period 02/15/2020, SpO2 100%. Body mass index is 43.42 kg/m.  General: She is overweight, cooperative, alert, well developed, and in no acute distress. PSYCH: Has normal mood, affect and thought process.   Extremities: No edema.  Neurologic: No gross sensory or motor deficits. No tremors or fasciculations noted.    DIAGNOSTIC DATA REVIEWED:  BMET    Component Value Date/Time   NA 142 08/08/2023 0825   K 4.8 08/08/2023 0825   CL 102 08/08/2023  0825   CO2 25 08/08/2023 0825   GLUCOSE 78 08/08/2023 0825   GLUCOSE 89 02/27/2019 0807   BUN 16 08/08/2023 0825   CREATININE 0.81 08/08/2023 0825   CALCIUM 9.9 08/08/2023 0825   GFRNONAA 76 07/30/2020 1201   GFRAA 88 07/30/2020 1201   Lab Results  Component Value Date   HGBA1C 5.4 02/01/2022   HGBA1C 5.1 07/14/2017   Lab Results  Component Value Date   INSULIN 5.2 02/01/2022   INSULIN 6.0 07/14/2017   Lab Results  Component Value Date   TSH 3.810 08/08/2023   CBC    Component Value Date/Time   WBC 6.6 08/08/2023 0825   WBC 12.7 (H) 03/05/2019 0430   RBC 5.16 08/08/2023 0825   RBC 4.57 03/05/2019 0430   HGB 15.8  08/08/2023 0825   HCT 48.1 (H) 08/08/2023 0825   PLT 262 08/08/2023 0825   MCV 93 08/08/2023 0825   MCH 30.6 08/08/2023 0825   MCH 29.3 03/05/2019 0430   MCHC 32.8 08/08/2023 0825   MCHC 31.2 03/05/2019 0430   RDW 12.8 08/08/2023 0825   Iron Studies    Component Value Date/Time   IRON 100 08/08/2023 0825   TIBC 294 07/30/2020 1201   FERRITIN 59 08/08/2023 0825   IRONPCTSAT 26 07/30/2020 1201   Lipid Panel     Component Value Date/Time   CHOL 197 08/08/2023 0825   TRIG 92 08/08/2023 0825   HDL 68 08/08/2023 0825   CHOLHDL 3.4 02/01/2022 0751   LDLCALC 113 (H) 08/08/2023 0825   Hepatic Function Panel     Component Value Date/Time   PROT 6.9 08/08/2023 0825   ALBUMIN 4.4 08/08/2023 0825   AST 20 08/08/2023 0825   ALT 22 08/08/2023 0825   ALKPHOS 127 (H) 08/08/2023 0825   BILITOT 0.5 08/08/2023 0825      Component Value Date/Time   TSH 3.810 08/08/2023 0825   Nutritional Lab Results  Component Value Date   VD25OH 69.2 08/08/2023   VD25OH 61.9 08/10/2022   VD25OH 63.2 02/01/2022     ASSESSMENT AND PLAN  TREATMENT PLAN FOR OBESITY:  Recommended Dietary Goals  Susan Dorsey is currently in the action stage of change. As such, her goal is to continue weight management plan. She has agreed to keeping a food journal and adhering to recommended goals of 1500 calories and 100+ protein.  Behavioral Intervention  We discussed the following Behavioral Modification Strategies today: increasing lean protein intake to established goals, decreasing simple carbohydrates , increasing vegetables, increasing fiber rich foods, increasing water intake , work on meal planning and preparation, reading food labels , keeping healthy foods at home, continue to work on implementation of reduced calorie nutritional plan, continue to practice mindfulness when eating, planning for success, and continue to work on maintaining a reduced calorie state, getting the recommended amount of protein,  incorporating whole foods, making healthy choices, staying well hydrated and practicing mindfulness when eating..  Additional resources provided today: NA  Recommended Physical Activity Goals  [name] has been advised to work up to 150 minutes of moderate intensity aerobic activity a week and strengthening exercises 2-3 times per week for cardiovascular health, weight loss maintenance and preservation of muscle mass.   She has agreed to Continue current level of physical activity , Think about enjoyable ways to increase daily physical activity and overcoming barriers to exercise, and Increase physical activity in their day and reduce sedentary time (increase NEAT).   Pharmacotherapy We discussed various medication options to  help Shaterica with her weight loss efforts and we both agreed to continue Zepbound 5mg  x 2 weeks and then increase to 7.5 mg.  Side effects discussed.  ASSOCIATED CONDITIONS ADDRESSED TODAY  Action/Plan  Vitamin D deficiency -     Vitamin D (Ergocalciferol); Take 1 tablet twice monthly  Dispense: 2 capsule; Refill: 0  Polyphagia -     Zepbound; Inject 7.5 mg into the skin once a week.  Dispense: 2 mL; Refill: 0  Class 3 severe obesity due to excess calories with serious comorbidity and body mass index (BMI) of 40.0 to 44.9 in adult (HCC) -     Zepbound; Inject 7.5 mg into the skin once a week.  Dispense: 2 mL; Refill: 0     Keep follow up appt with Dr. Dewaine Conger  BP is elevated today. Will continue to monitor.      Return in about 6 weeks (around 04/18/2024).Marland Kitchen She was informed of the importance of frequent follow up visits to maximize her success with intensive lifestyle modifications for her multiple health conditions.   ATTESTASTION STATEMENTS:  Reviewed by clinician on day of visit: allergies, medications, problem list, medical history, surgical history, family history, social history, and previous encounter notes.     Theodis Sato. Spruha Weight FNP-C

## 2024-03-09 ENCOUNTER — Other Ambulatory Visit (HOSPITAL_BASED_OUTPATIENT_CLINIC_OR_DEPARTMENT_OTHER): Payer: Self-pay

## 2024-03-26 ENCOUNTER — Telehealth (INDEPENDENT_AMBULATORY_CARE_PROVIDER_SITE_OTHER): Admitting: Psychology

## 2024-04-01 ENCOUNTER — Telehealth (INDEPENDENT_AMBULATORY_CARE_PROVIDER_SITE_OTHER): Admitting: Psychology

## 2024-04-01 DIAGNOSIS — F5089 Other specified eating disorder: Secondary | ICD-10-CM

## 2024-04-01 NOTE — Progress Notes (Signed)
  Office: [phone]  /  Fax: [phone]    Date: April 01, 2024  Appointment Start Time: 2:00pm Duration: 26 minutes Provider: Lawerance Cruel, Psy.D. Type of Session: Individual Therapy  Location of Patient: Parked in car at work (private location) Location of Provider: Provider's Home (private office) Type of Contact: Telepsychological Visit via MyChart Video Visit  Session Content: Susan Dorsey is a 63 y.o. female presenting for a follow-up appointment to address the previously established treatment goal of increasing coping skills.Today's appointment was a telepsychological visit. Susan Dorsey provided verbal consent for today's telepsychological appointment and she is aware she is responsible for securing confidentiality on her end of the session. Prior to proceeding with today's appointment, Susan Dorsey's physical location at the time of this appointment was obtained as well a phone number she could be reached at in the event of technical difficulties. Susan Dorsey and this provider participated in today's telepsychological service.   This provider conducted a brief check-in. Susan Dorsey reported "distracting" herself at night to avoid engagement in emotional eating behaviors and described an overall reduction in emotional eating behaviors. She also shared about a recent trip. Explored what went well as it relates to eating habits and what she could do differently during future trips. Psychoeducation provided regarding mindful eating strategies (sit down, slowly chew, savor, simplify, and smile). Overall, Susan Dorsey was receptive to today's appointment as evidenced by openness to sharing, responsiveness to feedback, and  willingness to implement mindful eating strategies .  Mental Status Examination:  Appearance: neat Behavior: appropriate to circumstances Mood: neutral Affect: mood congruent Speech: WNL Eye Contact: appropriate Psychomotor Activity: WNL Gait: unable to assess Thought Process: linear, logical, and goal directed and  no evidence or endorsement of suicidal, homicidal, and self-harm ideation, plan and intent  Thought Content/Perception: no hallucinations, delusions, bizarre thinking or behavior endorsed or observed Orientation: AAOx4 Memory/Concentration: intact Insight: good Judgment: good  Interventions:  Conducted a brief chart review Provided empathic reflections and validation Reviewed content from the previous session Provided positive reinforcement Employed supportive psychotherapy interventions to facilitate reduced distress and to improve coping skills with identified stressors Psychoeducation provided regarding mindful eating strategies  DSM-5 Diagnosis(es): F50.89 Other Specified Feeding or Eating Disorder, Emotional Eating Behaviors  Treatment Goal & Progress: During the initial appointment with this provider, the following treatment goal was established: increase coping skills. Susan Dorsey has demonstrated progress in her goal as evidenced by increased awareness of hunger patterns, increased awareness of triggers for emotional eating behaviors, and reduction in emotional eating behaviors . Susan Dorsey also continues to demonstrate willingness to engage in learned skill(s).  Plan: Based on progress to date per [name]'s next appointment is scheduled for 04/30/2024 at 2pm, which will be via MyChart Video Visit. The next session will focus on working towards the established treatment goal.   Lawerance Cruel, PsyD

## 2024-04-18 ENCOUNTER — Other Ambulatory Visit (HOSPITAL_BASED_OUTPATIENT_CLINIC_OR_DEPARTMENT_OTHER): Payer: Self-pay

## 2024-04-18 ENCOUNTER — Encounter: Payer: Self-pay | Admitting: Nurse Practitioner

## 2024-04-18 ENCOUNTER — Ambulatory Visit: Admitting: Nurse Practitioner

## 2024-04-18 VITALS — BP 131/84 | HR 66 | Temp 98.0°F | Ht 66.0 in | Wt 265.0 lb

## 2024-04-18 DIAGNOSIS — E66813 Obesity, class 3: Secondary | ICD-10-CM | POA: Diagnosis not present

## 2024-04-18 DIAGNOSIS — Z6841 Body Mass Index (BMI) 40.0 and over, adult: Secondary | ICD-10-CM

## 2024-04-18 DIAGNOSIS — E559 Vitamin D deficiency, unspecified: Secondary | ICD-10-CM

## 2024-04-18 MED ORDER — ZEPBOUND 10 MG/0.5ML ~~LOC~~ SOAJ
10.0000 mg | SUBCUTANEOUS | 0 refills | Status: DC
Start: 2024-04-18 — End: 2024-05-23
  Filled 2024-04-18: qty 2, 28d supply, fill #0

## 2024-04-18 MED ORDER — VITAMIN D (ERGOCALCIFEROL) 1.25 MG (50000 UNIT) PO CAPS
ORAL_CAPSULE | ORAL | 0 refills | Status: DC
  Filled 2024-04-18: qty 2, 28d supply, fill #0

## 2024-04-18 NOTE — Progress Notes (Signed)
 Office: [phone]  /  Fax: [phone]  WEIGHT SUMMARY AND BIOMETRICS  Weight Lost Since Last Visit: 4lb  Weight Gained Since Last Visit: 0lb   Vitals Temp: 98 F (36.7 C) BP: 131/84 Pulse Rate: 66 SpO2: 100 %   Anthropometric Measurements Height: 5\' 6"  (1.676 m) Weight: 265 lb (120.2 kg) BMI (Calculated): 42.79 Weight at Last Visit: 269lb Weight Lost Since Last Visit: 4lb Weight Gained Since Last Visit: 0lb Starting Weight: 273lb Total Weight Loss (lbs): 8 lb (3.629 kg)   Body Composition  Body Fat %: 52.4 % Fat Mass (lbs): 138.8 lbs Muscle Mass (lbs): 119.8 lbs Total Body Water (lbs): 95.8 lbs Visceral Fat Rating : 18   Other Clinical Data Fasting: Yes Labs: No Today's Visit #: 48 Starting Date: 07/30/20     HPI  Chief Complaint: OBESITY  Dorita is here to discuss her progress with her obesity treatment plan. She is on the keeping a food journal and adhering to recommended goals of 1500 calories and 100 protein and states she is following her eating plan approximately 80 % of the time. She states she is exercising 45-60 minutes 5 days per week.   Interval History:  Since last office visit she has lost 4 pounds. She went to a conference at [employer] and did well with food choices.  She is averaging around 1400 calories and 80 grams of protein. She saw Dr. Delaine Favorite last on 04/01/24.  She is walking and working in the yard to stay active.    BF: greek yogurt with blueberries with protein cereal Snack:  protein bar Lunch:  keto bread, [place], cream cheese, olives Snack:  vegetables and cheese Dinner:  homemade protein shake Snack:  popcorn Drinks:  water, 2 sodas, ICE drink  Pharmacotherapy for weight loss: She is currently taking Zepbound  7.5mg  (3 doses)  for medical weight loss.  Denies side effects.  She has been able to stay on Zepbound  consistently since her last visit.  She has had to stop/delay Zepbound  on due to surgery.   ZEPBOUND  INJ 2.5MG  is  approved through 07/14/2024.    Previous pharmacotherapy for medical weight loss:   She has tried Saxenda , Wegovy  and Mounjaro -side effects of diarrhea and vomiting.     Bariatric surgery:   Patient is status post Lap Band with Dr. Domenica Fried in 2008 and conversion to Sleeve Gastrectomy in 2020 by Dr. Gaylyn Keas. Her highest weight prior to surgery was 380 lbs and her nadir weight after surgery was 211 lbs. She is taking calcium, MV with iron and Vit D 50,000 IU every 2 weeks. She reports some restriction.  Vit D deficiency  She is taking Vit D 50,000 IU every 2 weeks.  Denies side effects.  Denies nausea, vomiting or muscle weakness.    Lab Results  Component Value Date   VD25OH 69.2 08/08/2023   VD25OH 61.9 08/10/2022   VD25OH 63.2 02/01/2022    PHYSICAL EXAM:  Blood pressure 131/84, pulse 66, temperature 98 F (36.7 C), height 5\' 6"  (1.676 m), weight 265 lb (120.2 kg), last menstrual period 02/15/2020, SpO2 100%. Body mass index is 42.77 kg/m.  General: She is overweight, cooperative, alert, well developed, and in no acute distress. PSYCH: Has normal mood, affect and thought process.   Extremities: No edema.  Neurologic: No gross sensory or motor deficits. No tremors or fasciculations noted.    DIAGNOSTIC DATA REVIEWED:  BMET    Component Value Date/Time   NA 142 08/08/2023 0825   K  4.8 08/08/2023 0825   CL 102 08/08/2023 0825   CO2 25 08/08/2023 0825   GLUCOSE 78 08/08/2023 0825   GLUCOSE 89 02/27/2019 0807   BUN 16 08/08/2023 0825   CREATININE 0.81 08/08/2023 0825   CALCIUM 9.9 08/08/2023 0825   GFRNONAA 76 07/30/2020 1201   GFRAA 88 07/30/2020 1201   Lab Results  Component Value Date   HGBA1C 5.4 02/01/2022   HGBA1C 5.1 07/14/2017   Lab Results  Component Value Date   INSULIN  5.2 02/01/2022   INSULIN  6.0 07/14/2017   Lab Results  Component Value Date   TSH 3.810 08/08/2023   CBC    Component Value Date/Time   WBC 6.6 08/08/2023 0825   WBC 12.7 (H) 03/05/2019  0430   RBC 5.16 08/08/2023 0825   RBC 4.57 03/05/2019 0430   HGB 15.8 08/08/2023 0825   HCT 48.1 (H) 08/08/2023 0825   PLT 262 08/08/2023 0825   MCV 93 08/08/2023 0825   MCH 30.6 08/08/2023 0825   MCH 29.3 03/05/2019 0430   MCHC 32.8 08/08/2023 0825   MCHC 31.2 03/05/2019 0430   RDW 12.8 08/08/2023 0825   Iron Studies    Component Value Date/Time   IRON 100 08/08/2023 0825   TIBC 294 07/30/2020 1201   FERRITIN 59 08/08/2023 0825   IRONPCTSAT 26 07/30/2020 1201   Lipid Panel     Component Value Date/Time   CHOL 197 08/08/2023 0825   TRIG 92 08/08/2023 0825   HDL 68 08/08/2023 0825   CHOLHDL 3.4 02/01/2022 0751   LDLCALC 113 (H) 08/08/2023 0825   Hepatic Function Panel     Component Value Date/Time   PROT 6.9 08/08/2023 0825   ALBUMIN 4.4 08/08/2023 0825   AST 20 08/08/2023 0825   ALT 22 08/08/2023 0825   ALKPHOS 127 (H) 08/08/2023 0825   BILITOT 0.5 08/08/2023 0825      Component Value Date/Time   TSH 3.810 08/08/2023 0825   Nutritional Lab Results  Component Value Date   VD25OH 69.2 08/08/2023   VD25OH 61.9 08/10/2022   VD25OH 63.2 02/01/2022     ASSESSMENT AND PLAN  TREATMENT PLAN FOR OBESITY:  Recommended Dietary Goals  Easter is currently in the action stage of change. As such, her goal is to continue weight management plan. She has agreed to keeping a food journal and adhering to recommended goals of 1400-1500 calories and 100+ protein.  Behavioral Intervention  We discussed the following Behavioral Modification Strategies today: increasing lean protein intake to established goals, decreasing simple carbohydrates , increasing vegetables, increasing fiber rich foods, increasing water intake , work on meal planning and preparation, work on tracking and journaling calories using tracking application, reading food labels , keeping healthy foods at home, and continue to work on maintaining a reduced calorie state, getting the recommended amount of protein,  incorporating whole foods, making healthy choices, staying well hydrated and practicing mindfulness when eating..  Additional resources provided today: NA  Recommended Physical Activity Goals  Leaner has been advised to work up to 150 minutes of moderate intensity aerobic activity a week and strengthening exercises 2-3 times per week for cardiovascular health, weight loss maintenance and preservation of muscle mass.   She has agreed to Continue current level of physical activity    Pharmacotherapy We discussed various medication options to help Adryana with her weight loss efforts and we both agreed to increase Zepbound  10mg .  Side effects discussed.  ASSOCIATED CONDITIONS ADDRESSED TODAY  Action/Plan  Vitamin D   deficiency -     Vitamin D  (Ergocalciferol ); Take 1 tablet twice monthly  Dispense: 2 capsule; Refill: 0  Class 3 severe obesity due to excess calories with serious comorbidity and body mass index (BMI) of 40.0 to 44.9 in adult (HCC) -     Zepbound ; Inject 10 mg into the skin once a week.  Dispense: 2 mL; Refill: 0     BP looks better today.  Will continue to monitor     Return in about 4 weeks (around 05/16/2024).Aaron Aas She was informed of the importance of frequent follow up visits to maximize her success with intensive lifestyle modifications for her multiple health conditions.   ATTESTASTION STATEMENTS:  Reviewed by clinician on day of visit: allergies, medications, problem list, medical history, surgical history, family history, social history, and previous encounter notes.     Crist Dominion. Debora Stockdale FNP-C

## 2024-04-30 ENCOUNTER — Telehealth (INDEPENDENT_AMBULATORY_CARE_PROVIDER_SITE_OTHER): Admitting: Psychology

## 2024-04-30 DIAGNOSIS — F5089 Other specified eating disorder: Secondary | ICD-10-CM

## 2024-04-30 NOTE — Progress Notes (Signed)
  Office: [phone]  /  Fax: [phone]    Date: Apr 30, 2024  Appointment Start Time: 2:02pm Duration: 28 minutes Provider: Catherene Close, Psy.D. Type of Session: Individual Therapy  Location of Patient: Home (private location) Location of Provider: Provider's Home (private office) Type of Contact: Telepsychological Visit via MyChart Video Visit  Session Content: Susan Dorsey is a 63 y.o. female presenting for a follow-up appointment to address the previously established treatment goal of increasing coping skills.Today's appointment was a telepsychological visit. Susan Dorsey provided verbal consent for today's telepsychological appointment and she is aware she is responsible for securing confidentiality on her end of the session. Prior to proceeding with today's appointment, Susan Dorsey's physical location at the time of this appointment was obtained as well a phone number she could be reached at in the event of technical difficulties. Susan Dorsey and this provider participated in today's telepsychological service.   This provider conducted a brief check-in. Susan Dorsey shared she lost four additional pounds. Explored what helped with the aforementioned. She identified pre-planning 300 calorie meals has been beneficial. She also reported implementing discussed mindful eating strategies. Psychoeducation regarding making better choices and engaging in portion control during the holidays/celebrations/vacations was provided. More specifically, this provider discussed the following strategies: coming to meals hungry, but not starving; avoid filling up on appetizers; managing portion sizes; not completely depriving yourself; making the plate colorful (e.g., vegetables); pacing yourself (e.g., waiting 10 minutes before going back for seconds); taking advantage of the nutritious foods; practicing mindfulness; staying hydrated; and avoid bringing home leftovers. Furthermore, termination planning was discussed based on progress to date per Susan Dorsey's  self-report. Susan Dorsey was receptive to a follow-up appointment in 4-5 weeks and an additional follow-up/termination appointment in 4-5 weeks after that. Overall, Susan Dorsey was receptive to today's appointment as evidenced by openness to sharing, responsiveness to feedback, and willingness to implement discussed strategies .  Mental Status Examination:  Appearance: neat Behavior: appropriate to circumstances Mood: neutral Affect: mood congruent Speech: WNL Eye Contact: appropriate Psychomotor Activity: WNL Gait: unable to assess Thought Process: linear, logical, and goal directed and no evidence or endorsement of suicidal, homicidal, and self-harm ideation, plan and intent  Thought Content/Perception: no hallucinations, delusions, bizarre thinking or behavior endorsed or observed Orientation: AAOx4 Memory/Concentration: intact Insight: good Judgment: good  Interventions:  Conducted a brief chart review Provided empathic reflections and validation Reviewed content from the previous session Provided positive reinforcement Employed supportive psychotherapy interventions to facilitate reduced distress and to improve coping skills with identified stressors Discussed termination planning  DSM-5 Diagnosis(es): F50.89 Other Specified Feeding or Eating Disorder, Emotional Eating Behaviors  Treatment Goal & Progress: During the initial appointment with this provider, the following treatment goal was established: increase coping skills. Susan Dorsey has demonstrated progress in her goal as evidenced by increased awareness of hunger patterns, increased awareness of triggers for emotional eating behaviors, and reduction in emotional eating behaviors. Susan Dorsey also continues to demonstrate willingness to engage in learned skill(s).  Plan: Based on Susan Dorsey's schedule and her upcoming trips as well as appointment availability, the next appointment is scheduled for 06/11/2024 at 2pm, which will be via MyChart Video Visit. The next  session will focus on working towards the established treatment goal.   Catherene Close, PsyD

## 2024-05-23 ENCOUNTER — Ambulatory Visit: Admitting: Family Medicine

## 2024-05-23 ENCOUNTER — Encounter: Payer: Self-pay | Admitting: Family Medicine

## 2024-05-23 ENCOUNTER — Other Ambulatory Visit (HOSPITAL_BASED_OUTPATIENT_CLINIC_OR_DEPARTMENT_OTHER): Payer: Self-pay

## 2024-05-23 VITALS — BP 136/89 | HR 60 | Temp 97.9°F | Ht 66.0 in | Wt 262.0 lb

## 2024-05-23 DIAGNOSIS — K912 Postsurgical malabsorption, not elsewhere classified: Secondary | ICD-10-CM

## 2024-05-23 DIAGNOSIS — R632 Polyphagia: Secondary | ICD-10-CM

## 2024-05-23 DIAGNOSIS — Z6841 Body Mass Index (BMI) 40.0 and over, adult: Secondary | ICD-10-CM

## 2024-05-23 DIAGNOSIS — E559 Vitamin D deficiency, unspecified: Secondary | ICD-10-CM

## 2024-05-23 DIAGNOSIS — E66813 Obesity, class 3: Secondary | ICD-10-CM | POA: Diagnosis not present

## 2024-05-23 MED ORDER — VITAMIN D (ERGOCALCIFEROL) 1.25 MG (50000 UNIT) PO CAPS
ORAL_CAPSULE | ORAL | 0 refills | Status: DC
  Filled 2024-05-23: qty 2, 28d supply, fill #0

## 2024-05-23 MED ORDER — ZEPBOUND 12.5 MG/0.5ML ~~LOC~~ SOAJ
12.5000 mg | SUBCUTANEOUS | 1 refills | Status: DC
  Filled 2024-05-23: qty 2, 28d supply, fill #0

## 2024-05-23 NOTE — Progress Notes (Signed)
 Office: [phone]  /  Fax: [phone]  WEIGHT SUMMARY AND BIOMETRICS  Starting Date: 07/30/20  Starting Weight: 273lb   Weight Lost Since Last Visit: 3lb   Vitals Temp: 97.9 F (36.6 C) BP: 136/89 Pulse Rate: 60 SpO2: 99 %   Body Composition  Body Fat %: 51.4 % Fat Mass (lbs): 134.8 lbs Muscle Mass (lbs): 121.2 lbs Total Body Water (lbs): 95 lbs Visceral Fat Rating : 17   HPI  Chief Complaint: OBESITY  Susan Dorsey is here to discuss her progress with her obesity treatment plan. She is on the keeping a food journal and adhering to recommended goals of 1500 calories and 90 protein and states she is following her eating plan approximately 75 % of the time. She states she is exercising 60 minutes 3 times per week.  Interval History:  Since last office visit she is down 3 lb  She did do well with the dose increase of Zepbound  to 10 mg She has improvement in food noise with Zepbound  She is hydrating well with and outside of mealtime She avoids SSBs She is going to the gym 3 days/ wk (senior class) -- has a trainer with cardio and resistance training This gives her a net weight los of 11 lb in the past 2.5 years of medically supervised weight management This is a 4% total body weight loss She is walking dog most days of the week  Pharmacotherapy: Zepbound  10 mg once weekly injection  PHYSICAL EXAM:  Blood pressure 136/89, pulse 60, temperature 97.9 F (36.6 C), height 5\' 6"  (1.676 m), weight 262 lb (118.8 kg), last menstrual period 02/15/2020, SpO2 99%. Body mass index is 42.29 kg/m.  General: She is overweight, cooperative, alert, well developed, and in no acute distress. PSYCH: Has normal mood, affect and thought process.   Lungs: Normal breathing effort, no conversational dyspnea.   ASSESSMENT AND PLAN  TREATMENT PLAN FOR OBESITY:  Recommended Dietary Goals  Susan Dorsey is currently in the action stage of change. As such, her goal is to continue weight management  plan. She has agreed to keeping a food journal and adhering to recommended goals of 1500 calories and 100 to 120 g of protein and practicing portion control and making smarter food choices, such as increasing vegetables and decreasing simple carbohydrates.  Behavioral Intervention  We discussed the following Behavioral Modification Strategies today: increasing lean protein intake to established goals, increasing fiber rich foods, increasing water intake , work on meal planning and preparation, keeping healthy foods at home, practice mindfulness eating and understand the difference between hunger signals and cravings, work on managing stress, creating time for self-care and relaxation, avoiding temptations and identifying enticing environmental cues, continue to work on implementation of reduced calorie nutritional plan, continue to practice mindfulness when eating, and continue to work on maintaining a reduced calorie state, getting the recommended amount of protein, incorporating whole foods, making healthy choices, staying well hydrated and practicing mindfulness when eating..  Additional resources provided today: NA  Recommended Physical Activity Goals  Susan Dorsey has been advised to work up to 150 minutes of moderate intensity aerobic activity a week and strengthening exercises 2-3 times per week for cardiovascular health, weight loss maintenance and preservation of muscle mass.   She has agreed to Work on scheduling and tracking physical activity.  Work on increasing walking time on non - gym days  Pharmacotherapy changes for the treatment of obesity: Increase Zepbound  to 12.5 mg once weekly injection  ASSOCIATED CONDITIONS ADDRESSED TODAY  Polyphagia Improving on Zepbound  without meal skipping, nausea, vomiting or dyspepsia. She has previous GI intolerance to other GLP-1 receptor agonist She has been working on getting in lean protein and fiber with meals  Increase Zepbound  to 12.5 mg once  weekly injection Try tracking protein intake with a goal of 100 to 120 g/day  Class 3 severe obesity due to excess calories with serious comorbidity and body mass index (BMI) of 40.0 to 44.9 in adult -     Zepbound ; Inject 12.5 mg into the skin once a week.  Dispense: 2 mL; Refill: 1  Vitamin D  deficiency -     Vitamin D  (Ergocalciferol ); Take 1 tablet twice monthly  Dispense: 2 capsule; Refill: 0 She is taking vitamin D  50,000 IU q. 14 days.  Annual post-bariatric surgery labs are due in August  Postoperative malabsorption Status post vertical sleeve gastrectomy in 2020 with Dr. Gaylyn Keas with a max weight of 380 pounds and a nadir weight of 211 pounds.  She is still up 51 pounds from her nadir weight and has room for improvement with compliance with a post bariatric surgery diet and exercise plan.  She is motivated to continue working on healthy lifestyle changes.  She is taking a multivitamin and vitamin D  as directed.  Annual bariatric labs are due in August.     She was informed of the importance of frequent follow up visits to maximize her success with intensive lifestyle modifications for her multiple health conditions.   ATTESTASTION STATEMENTS:  Reviewed by clinician on day of visit: allergies, medications, problem list, medical history, surgical history, family history, social history, and previous encounter notes pertinent to obesity diagnosis.   I have personally spent 30 minutes total time today in preparation, patient care, nutritional counseling and education,  and documentation for this visit, including the following: review of most recent clinical lab tests, prescribing medications/ refilling medications, reviewing medical assistant documentation, review and interpretation of bioimpedence results.     Micky Albee, D.O. DABFM, DABOM [facility] [address] [place], [place] [address] [phone]

## 2024-06-11 ENCOUNTER — Telehealth (INDEPENDENT_AMBULATORY_CARE_PROVIDER_SITE_OTHER): Admitting: Psychology

## 2024-06-17 ENCOUNTER — Telehealth: Payer: Self-pay | Admitting: *Deleted

## 2024-06-17 NOTE — Telephone Encounter (Signed)
 Prior authorization done via cover my meds for patients Zepbound . Waiting determination.

## 2024-06-18 ENCOUNTER — Telehealth (INDEPENDENT_AMBULATORY_CARE_PROVIDER_SITE_OTHER): Admitting: Psychology

## 2024-06-18 DIAGNOSIS — F5089 Other specified eating disorder: Secondary | ICD-10-CM

## 2024-06-18 NOTE — Progress Notes (Signed)
  Office: [phone]  /  Fax: [phone]    Date: June 18, 2024  Appointment Start Time: 4:03pm Duration: 24 minutes Provider: Wyatt Fire, Psy.D. Type of Session: Individual Therapy  Location of Patient: Work (private location) Location of Provider: Provider's Home (private office) Type of Contact: Telepsychological Visit via MyChart Video Visit  Session Content: Susan Dorsey is a 63 y.o. female presenting for a follow-up appointment to address the previously established treatment goal of increasing coping skills. Today's appointment was a telepsychological visit. Susan Dorsey provided verbal consent for today's telepsychological appointment and she is aware she is responsible for securing confidentiality on her end of the session. Prior to proceeding with today's appointment, Susan Dorsey's physical location at the time of this appointment was obtained as well a phone number she could be reached at in the event of technical difficulties. Susan Dorsey and this provider participated in today's telepsychological service.   This provider conducted a brief check-in. Susan Dorsey shared about recent events, including losing weight despite traveling and changes in routine, attending a funeral, and losing enough weight to be approved for Zepbound  for a year. Explored what went well as it relates to eating habits while traveling and what she could do differently in the future. Reflected on non-scale victories. Overall, Susan Dorsey was receptive to today's appointment as evidenced by openness to sharing, responsiveness to feedback, and willingness to continue engaging in learned skills.  Mental Status Examination:  Appearance: neat Behavior: appropriate to circumstances Mood: neutral Affect: mood congruent Speech: WNL Eye Contact: appropriate Psychomotor Activity: WNL Gait: unable to assess Thought Process: linear, logical, and goal directed and no evidence or endorsement of suicidal, homicidal, and self-harm ideation, plan and intent  Thought  Content/Perception: no hallucinations, delusions, bizarre thinking or behavior endorsed or observed Orientation: AAOx4 Memory/Concentration: intact Insight: good Judgment: good  Interventions:  Conducted a brief chart review Provided empathic reflections and validation Provided positive reinforcement Employed supportive psychotherapy interventions to facilitate reduced distress and to improve coping skills with identified stressors  DSM-5 Diagnosis(es): F50.89 Other Specified Feeding or Eating Disorder, Emotional Eating Behaviors  Treatment Goal & Progress: During the initial appointment with this provider, the following treatment goal was established: increase coping skills. Susan Dorsey has demonstrated progress in her goal as evidenced by increased awareness of hunger patterns, increased awareness of triggers for emotional eating behaviors, and reduction in emotional eating behaviors. Susan Dorsey also continues to demonstrate willingness to engage in learned skill(s).   Plan: The next appointment is scheduled for 07/23/2024 at 4pm, which will be via MyChart Video Visit. The next session will focus on working towards the established treatment goal and termination.   Wyatt Fire, PsyD

## 2024-06-20 ENCOUNTER — Other Ambulatory Visit (HOSPITAL_BASED_OUTPATIENT_CLINIC_OR_DEPARTMENT_OTHER): Payer: Self-pay

## 2024-06-20 ENCOUNTER — Encounter: Payer: Self-pay | Admitting: Nurse Practitioner

## 2024-06-20 ENCOUNTER — Ambulatory Visit: Admitting: Nurse Practitioner

## 2024-06-20 DIAGNOSIS — E559 Vitamin D deficiency, unspecified: Secondary | ICD-10-CM | POA: Diagnosis not present

## 2024-06-20 DIAGNOSIS — E66813 Obesity, class 3: Secondary | ICD-10-CM | POA: Diagnosis not present

## 2024-06-20 DIAGNOSIS — Z6841 Body Mass Index (BMI) 40.0 and over, adult: Secondary | ICD-10-CM | POA: Diagnosis not present

## 2024-06-20 MED ORDER — ZEPBOUND 12.5 MG/0.5ML ~~LOC~~ SOAJ
12.5000 mg | SUBCUTANEOUS | 1 refills | Status: DC
  Filled 2024-06-20: qty 2, 28d supply, fill #0
  Filled 2024-07-16: qty 2, 28d supply, fill #1

## 2024-06-20 MED ORDER — VITAMIN D (ERGOCALCIFEROL) 1.25 MG (50000 UNIT) PO CAPS
ORAL_CAPSULE | ORAL | 1 refills | Status: DC
  Filled 2024-06-20: qty 2, 28d supply, fill #0
  Filled 2024-07-16: qty 2, 28d supply, fill #1

## 2024-06-20 NOTE — Progress Notes (Signed)
 Office: [phone]  /  Fax: [phone]  WEIGHT SUMMARY AND BIOMETRICS  Weight Lost Since Last Visit: 5lb  Weight Gained Since Last Visit: 0lb   Vitals Temp: 98.1 F (36.7 C) BP: 139/81 Pulse Rate: 70 SpO2: 99 %   Anthropometric Measurements Height: 5' 6 (1.676 m) Weight: 257 lb (116.6 kg) BMI (Calculated): 41.5 Weight at Last Visit: 262lb Weight Lost Since Last Visit: 5lb Weight Gained Since Last Visit: 0lb Starting Weight: 273lb Total Weight Loss (lbs): 16 lb (7.258 kg)   Body Composition  Body Fat %: 51.2 % Fat Mass (lbs): 131.6 lbs Muscle Mass (lbs): 119 lbs Total Body Water (lbs): 93.2 lbs Visceral Fat Rating : 17   Other Clinical Data Fasting: Yes Labs: No Today's Visit #: 50 Starting Date: 07/30/20     HPI  Chief Complaint: OBESITY  Susan Dorsey is here to discuss her progress with her obesity treatment plan. She is on the keeping a food journal and adhering to recommended goals of 1500 calories and 90 protein and states she is following her eating plan approximately 85 % of the time. She states she is exercising 60 minutes 3 days per week.   Interval History:  Since last office visit she has lost 5 pounds.  She has been traveling but has been mindful of what she has been eating.  She has been making healthier choices and bringing her own food when traveling. She tends to eat the same things Monday-Friday. She is trying to eat more natural whole foods.  She is drinking water daily.  Denies sugary drinks.     She saw Dr. Sharron last on 06/18/24 and has her last follow up visit on 07/23/24  Pharmacotherapy for weight loss: She is currently taking Zepbound  12.5 mg  for medical weight loss.  Notes occ diarrhea.  She has been able to stay on Zepbound  consistently since her last visit.  She is scheduled for another eye surgery July 9th.  Will skip dose prior to surgery.       Previous pharmacotherapy for medical weight loss:   She has tried Saxenda , Wegovy   and Mounjaro -side effects of diarrhea and vomiting.     Bariatric surgery:   Patient is status post Lap Band with Dr. Nadara in 2008 and conversion to Sleeve Gastrectomy in 2020 by Dr. Gladis. Her highest weight prior to surgery was 380 lbs and her nadir weight after surgery was 211 lbs. She is taking calcium, MV with iron and Vit D 50,000 IU every 2 weeks. She reports some restriction.  Vit D deficiency  She is taking Vit D 50,000 IU every 2 weeks.  Denies side effects.  Denies nausea, vomiting or muscle weakness.    Lab Results  Component Value Date   VD25OH 69.2 08/08/2023   VD25OH 61.9 08/10/2022   VD25OH 63.2 02/01/2022     PHYSICAL EXAM:  Blood pressure 139/81, pulse 70, temperature 98.1 F (36.7 C), height 5' 6 (1.676 m), weight 257 lb (116.6 kg), last menstrual period 02/15/2020, SpO2 99%. Body mass index is 41.48 kg/m.  General: She is overweight, cooperative, alert, well developed, and in no acute distress. PSYCH: Has normal mood, affect and thought process.   Extremities: No edema.  Neurologic: No gross sensory or motor deficits. No tremors or fasciculations noted.    DIAGNOSTIC DATA REVIEWED:  BMET    Component Value Date/Time   NA 142 08/08/2023 0825   K 4.8 08/08/2023 0825   CL 102 08/08/2023 0825   CO2  25 08/08/2023 0825   GLUCOSE 78 08/08/2023 0825   GLUCOSE 89 02/27/2019 0807   BUN 16 08/08/2023 0825   CREATININE 0.81 08/08/2023 0825   CALCIUM 9.9 08/08/2023 0825   GFRNONAA 76 07/30/2020 1201   GFRAA 88 07/30/2020 1201   Lab Results  Component Value Date   HGBA1C 5.4 02/01/2022   HGBA1C 5.1 07/14/2017   Lab Results  Component Value Date   INSULIN  5.2 02/01/2022   INSULIN  6.0 07/14/2017   Lab Results  Component Value Date   TSH 3.810 08/08/2023   CBC    Component Value Date/Time   WBC 6.6 08/08/2023 0825   WBC 12.7 (H) 03/05/2019 0430   RBC 5.16 08/08/2023 0825   RBC 4.57 03/05/2019 0430   HGB 15.8 08/08/2023 0825   HCT 48.1 (H)  08/08/2023 0825   PLT 262 08/08/2023 0825   MCV 93 08/08/2023 0825   MCH 30.6 08/08/2023 0825   MCH 29.3 03/05/2019 0430   MCHC 32.8 08/08/2023 0825   MCHC 31.2 03/05/2019 0430   RDW 12.8 08/08/2023 0825   Iron Studies    Component Value Date/Time   IRON 100 08/08/2023 0825   TIBC 294 07/30/2020 1201   FERRITIN 59 08/08/2023 0825   IRONPCTSAT 26 07/30/2020 1201   Lipid Panel     Component Value Date/Time   CHOL 197 08/08/2023 0825   TRIG 92 08/08/2023 0825   HDL 68 08/08/2023 0825   CHOLHDL 3.4 02/01/2022 0751   LDLCALC 113 (H) 08/08/2023 0825   Hepatic Function Panel     Component Value Date/Time   PROT 6.9 08/08/2023 0825   ALBUMIN 4.4 08/08/2023 0825   AST 20 08/08/2023 0825   ALT 22 08/08/2023 0825   ALKPHOS 127 (H) 08/08/2023 0825   BILITOT 0.5 08/08/2023 0825      Component Value Date/Time   TSH 3.810 08/08/2023 0825   Nutritional Lab Results  Component Value Date   VD25OH 69.2 08/08/2023   VD25OH 61.9 08/10/2022   VD25OH 63.2 02/01/2022     ASSESSMENT AND PLAN  TREATMENT PLAN FOR OBESITY:  Recommended Dietary Goals  Susan Dorsey is currently in the action stage of change. As such, her goal is to continue weight management plan. She has agreed to keeping a food journal and adhering to recommended goals of 1500 calories and 90 protein.  Behavioral Intervention  We discussed the following Behavioral Modification Strategies today: increasing lean protein intake to established goals, decreasing simple carbohydrates , increasing vegetables, increasing fiber rich foods, increasing water intake , work on meal planning and preparation, and continue to work on maintaining a reduced calorie state, getting the recommended amount of protein, incorporating whole foods, making healthy choices, staying well hydrated and practicing mindfulness when eating..  Additional resources provided today: NA  Recommended Physical Activity Goals  Susan Dorsey has been advised to work up to  150 minutes of moderate intensity aerobic activity a week and strengthening exercises 2-3 times per week for cardiovascular health, weight loss maintenance and preservation of muscle mass.   She has agreed to Think about enjoyable ways to increase daily physical activity and overcoming barriers to exercise, Increase physical activity in their day and reduce sedentary time (increase NEAT)., and continue to gradually increase the amount and intensity of exercise routine   Pharmacotherapy We discussed various medication options to help Susan Dorsey with her weight loss efforts and we both agreed to continue Zepbound  12.5mg . Denies side effects discussed.  ASSOCIATED CONDITIONS ADDRESSED TODAY  Action/Plan  Class  3 severe obesity due to excess calories with serious comorbidity and body mass index (BMI) of 40.0 to 44.9 in adult -     Zepbound ; Inject 12.5 mg into the skin once a week.  Dispense: 2 mL; Refill: 1  Vitamin D  deficiency -     Vitamin D  (Ergocalciferol ); Take 1 tablet twice monthly  Dispense: 2 capsule; Refill: 1      Will obtain labs at next visit.     Return in about 8 weeks (around 08/15/2024).Susan Dorsey She was informed of the importance of frequent follow up visits to maximize her success with intensive lifestyle modifications for her multiple health conditions.   ATTESTASTION STATEMENTS:  Reviewed by clinician on day of visit: allergies, medications, problem list, medical history, surgical history, family history, social history, and previous encounter notes.     Susan Dorsey SAUNDERS. Bernd Crom FNP-C

## 2024-06-27 NOTE — Telephone Encounter (Signed)
 Patients Zepbound  was approved.

## 2024-07-23 ENCOUNTER — Telehealth (INDEPENDENT_AMBULATORY_CARE_PROVIDER_SITE_OTHER): Admitting: Psychology

## 2024-07-23 DIAGNOSIS — F5089 Other specified eating disorder: Secondary | ICD-10-CM

## 2024-07-23 NOTE — Progress Notes (Signed)
  Office: [phone]  /  Fax: [phone]    Date: July 23, 2024  Appointment Start Time: 4:08pm Duration: 19 minutes Provider: Wyatt Fire, Psy.D. Type of Session: Individual Therapy  Location of Patient: Work (private location) Location of Provider: Provider's Home (private office) Type of Contact: Telepsychological Visit via MyChart Video Visit  Session Content: Susan Dorsey is a 63 y.o. female presenting for a follow-up appointment to address the previously established treatment goal of increasing coping skills. Today's appointment was a telepsychological visit. Susan Dorsey provided verbal consent for today's telepsychological appointment and she is aware she is responsible for securing confidentiality on her end of the session. Prior to proceeding with today's appointment, Susan Dorsey's physical location at the time of this appointment was obtained as well a phone number she could be reached at in the event of technical difficulties. Susan Dorsey and this provider participated in today's telepsychological service.   This provider conducted a brief check-in. Susan Dorsey stated, It has been a busy month. She further shared about recent health-related concerns and her uncle's passing. Despite the aforementioned, she continues to lose weight. A plan was developed to help Susan Dorsey cope with emotional eating  behaviors in the future using learned skills. She wrote down the following plan: focus on keeping a realistic routine, focus on hydration; be prepared with snacks congruent to calorie/protein goals; pause to ask questions when triggered to eat (e.g., Am I really hungry?, Is there something bothering me?, and Will I feel better if I eat?); and engage in discussed coping strategies after going through the aforementioned questions (e.g., crafting, exercising, minimizing distractions). Overall,  Susan Dorsey was receptive to today's appointment as evidenced by openness to sharing, responsiveness to feedback, and willingness to continue engaging in  learned skills.  Mental Status Examination:  Appearance: neat Behavior: appropriate to circumstances Mood: neutral Affect: mood congruent Speech: WNL Eye Contact: appropriate Psychomotor Activity: WNL Gait: unable to assess Thought Process: linear, logical, and goal directed and no evidence or endorsement of suicidal, homicidal, and self-harm ideation, plan and intent  Thought Content/Perception: no hallucinations, delusions, bizarre thinking or behavior endorsed or observed Orientation: AAOx4 Memory/Concentration: intact Insight: good Judgment: good  Interventions:  Conducted a brief chart review Provided empathic reflections and validation Reviewed content from the previous session Provided positive reinforcement Employed supportive psychotherapy interventions to facilitate reduced distress and to improve coping skills with identified stressors Reviewed learned skills  DSM-5 Diagnosis(es): F50.89 Other Specified Feeding or Eating Disorder, Emotional Eating Behaviors  Treatment Goal & Progress: During the initial appointment with this provider, the following treatment goal was established: increase coping skills. Susan Dorsey demonstrated progress in her goal as evidenced by increased awareness of hunger patterns, increased awareness of triggers for emotional eating behaviors, and reduction in emotional eating behaviors. Susan Dorsey also continues to demonstrate willingness to engage in learned skill(s).   Plan: As previously planned, today was Susan Dorsey's last appointment with this provider. She acknowledged understanding that she may request a follow-up appointment with this provider in the future as long as she is still established with the clinic. No further follow-up planned by this provider.    Wyatt Fire, PsyD

## 2024-07-25 ENCOUNTER — Other Ambulatory Visit: Payer: Self-pay | Admitting: Gastroenterology

## 2024-07-25 DIAGNOSIS — K7581 Nonalcoholic steatohepatitis (NASH): Secondary | ICD-10-CM

## 2024-08-05 ENCOUNTER — Ambulatory Visit
Admission: RE | Admit: 2024-08-05 | Discharge: 2024-08-05 | Disposition: A | Discharge status: Home / Self Care | Source: Ambulatory Visit | Attending: Gastroenterology | Admitting: Gastroenterology

## 2024-08-05 DIAGNOSIS — K7581 Nonalcoholic steatohepatitis (NASH): Secondary | ICD-10-CM

## 2024-08-22 ENCOUNTER — Other Ambulatory Visit (HOSPITAL_BASED_OUTPATIENT_CLINIC_OR_DEPARTMENT_OTHER): Payer: Self-pay

## 2024-08-22 ENCOUNTER — Ambulatory Visit (INDEPENDENT_AMBULATORY_CARE_PROVIDER_SITE_OTHER): Admitting: Nurse Practitioner

## 2024-08-22 ENCOUNTER — Encounter: Payer: Self-pay | Admitting: Nurse Practitioner

## 2024-08-22 VITALS — BP 128/86 | HR 59 | Temp 97.9°F | Ht 66.0 in | Wt 249.0 lb

## 2024-08-22 DIAGNOSIS — E559 Vitamin D deficiency, unspecified: Secondary | ICD-10-CM | POA: Diagnosis not present

## 2024-08-22 DIAGNOSIS — E66813 Obesity, class 3: Secondary | ICD-10-CM

## 2024-08-22 DIAGNOSIS — K912 Postsurgical malabsorption, not elsewhere classified: Secondary | ICD-10-CM

## 2024-08-22 DIAGNOSIS — K760 Fatty (change of) liver, not elsewhere classified: Secondary | ICD-10-CM

## 2024-08-22 DIAGNOSIS — Z6841 Body Mass Index (BMI) 40.0 and over, adult: Secondary | ICD-10-CM

## 2024-08-22 MED ORDER — VITAMIN D (ERGOCALCIFEROL) 1.25 MG (50000 UNIT) PO CAPS
ORAL_CAPSULE | ORAL | 1 refills | Status: AC
Start: 2024-08-22 — End: ?
  Filled 2024-08-22: qty 2, 28d supply, fill #0
  Filled 2024-09-12: qty 2, 28d supply, fill #1

## 2024-08-22 MED ORDER — ZEPBOUND 12.5 MG/0.5ML ~~LOC~~ SOAJ
12.5000 mg | SUBCUTANEOUS | 1 refills | Status: DC
  Filled 2024-08-22: qty 2, 28d supply, fill #0
  Filled 2024-09-12: qty 2, 28d supply, fill #1

## 2024-08-22 NOTE — Progress Notes (Signed)
 Office: [phone]  /  Fax: [phone]  WEIGHT SUMMARY AND BIOMETRICS  Weight Lost Since Last Visit: 8lb  Weight Gained Since Last Visit: 0lb   Vitals Temp: 97.9 F (36.6 C) BP: 128/86 Pulse Rate: (!) 59 SpO2: 100 %   Anthropometric Measurements Height: 5' 6 (1.676 m) Weight: 249 lb (112.9 kg) BMI (Calculated): 40.21 Weight at Last Visit: 257lb Weight Lost Since Last Visit: 8lb Weight Gained Since Last Visit: 0lb Starting Weight: 273lb Total Weight Loss (lbs): 24 lb (10.9 kg)   Body Composition  Body Fat %: 50.7 % Fat Mass (lbs): 126.4 lbs Muscle Mass (lbs): 116.6 lbs Total Body Water (lbs): 90.6 lbs Visceral Fat Rating : 16   Other Clinical Data Fasting: Yes Labs: No Today's Visit #: 51 Starting Date: 07/30/20     HPI  Chief Complaint: OBESITY  Susan Dorsey is here to discuss her progress with her obesity treatment plan. She is on the keeping a food journal and adhering to recommended goals of 1500 calories and 90 protein and states she is following her eating plan approximately 80 % of the time. She states she is exercising 60 minutes 4 days per week.   Interval History:  Since last office visit she has lost 8 pounds.  She has let go of tracking and that has helped.  She is meal prepping, making healthier choices and watching her portion sizes.  She snacks on nuts, cheese, veggies, yogurt and popcorn.  She is drinking coffee, water, diet sodas and fairlife protein shake.  She is starting back working out with a Systems analyst next week.  She is walking to stay active.   Pharmacotherapy for weight loss: She is currently taking Zepbound  12.5 mg  for medical weight loss.  Notes occ diarrhea.  She has been able to stay on Zepbound  consistently since her last visit.  She is scheduled for another eye surgery July 9th.  Will skip dose prior to surgery.     Previous pharmacotherapy for medical weight loss:   She has tried Saxenda , Wegovy  and Mounjaro -side  effects of diarrhea and vomiting.     Bariatric surgery:   Patient is status post Lap Band with Dr. Nadara in 2008 and conversion to Sleeve Gastrectomy in 2020 by Dr. Gladis. Her highest weight prior to surgery was 380 lbs and her nadir weight after surgery was 211 lbs. She is taking calcium, MV with iron and Vit D 50,000 IU every 2 weeks. She reports some restriction.  NASH She saw GI last on 07/25/24. She had US  ABDOMEN RUQ W/ELASTOGRAPHY on 08/05/24.     Fibrosis 4 Score = .99 (Low risk)         Interpretation for patients with HCV          <1.45       -  F0-F1 (Low risk)          1.45-3.25 -  Indeterminate           >3.25      -  F3-F4 (High risk)     Validated for ages 16-65   Vit D deficiency  She is taking Vit D 50,000 IU every 2 weeks.  Denies side effects.  Denies nausea, vomiting or muscle weakness.    Lab Results  Component Value Date   VD25OH 69.2 08/08/2023   VD25OH 61.9 08/10/2022   VD25OH 63.2 02/01/2022    PHYSICAL EXAM:  Blood pressure 128/86, pulse (!) 59, temperature 97.9 F (36.6 C), height 5' 6 (  1.676 m), weight 249 lb (112.9 kg), last menstrual period 02/15/2020, SpO2 100%. Body mass index is 40.19 kg/m.  General: She is overweight, cooperative, alert, well developed, and in no acute distress. PSYCH: Has normal mood, affect and thought process.   Extremities: No edema.  Neurologic: No gross sensory or motor deficits. No tremors or fasciculations noted.    DIAGNOSTIC DATA REVIEWED:  BMET    Component Value Date/Time   NA 142 08/08/2023 0825   K 4.8 08/08/2023 0825   CL 102 08/08/2023 0825   CO2 25 08/08/2023 0825   GLUCOSE 78 08/08/2023 0825   GLUCOSE 89 02/27/2019 0807   BUN 16 08/08/2023 0825   CREATININE 0.81 08/08/2023 0825   CALCIUM 9.9 08/08/2023 0825   GFRNONAA 76 07/30/2020 1201   GFRAA 88 07/30/2020 1201   Lab Results  Component Value Date   HGBA1C 5.4 02/01/2022   HGBA1C 5.1 07/14/2017   Lab Results  Component Value Date    INSULIN  5.2 02/01/2022   INSULIN  6.0 07/14/2017   Lab Results  Component Value Date   TSH 3.810 08/08/2023   CBC    Component Value Date/Time   WBC 6.6 08/08/2023 0825   WBC 12.7 (H) 03/05/2019 0430   RBC 5.16 08/08/2023 0825   RBC 4.57 03/05/2019 0430   HGB 15.8 08/08/2023 0825   HCT 48.1 (H) 08/08/2023 0825   PLT 262 08/08/2023 0825   MCV 93 08/08/2023 0825   MCH 30.6 08/08/2023 0825   MCH 29.3 03/05/2019 0430   MCHC 32.8 08/08/2023 0825   MCHC 31.2 03/05/2019 0430   RDW 12.8 08/08/2023 0825   Iron Studies    Component Value Date/Time   IRON 100 08/08/2023 0825   TIBC 294 07/30/2020 1201   FERRITIN 59 08/08/2023 0825   IRONPCTSAT 26 07/30/2020 1201   Lipid Panel     Component Value Date/Time   CHOL 197 08/08/2023 0825   TRIG 92 08/08/2023 0825   HDL 68 08/08/2023 0825   CHOLHDL 3.4 02/01/2022 0751   LDLCALC 113 (H) 08/08/2023 0825   Hepatic Function Panel     Component Value Date/Time   PROT 6.9 08/08/2023 0825   ALBUMIN 4.4 08/08/2023 0825   AST 20 08/08/2023 0825   ALT 22 08/08/2023 0825   ALKPHOS 127 (H) 08/08/2023 0825   BILITOT 0.5 08/08/2023 0825      Component Value Date/Time   TSH 3.810 08/08/2023 0825   Nutritional Lab Results  Component Value Date   VD25OH 69.2 08/08/2023   VD25OH 61.9 08/10/2022   VD25OH 63.2 02/01/2022     ASSESSMENT AND PLAN  TREATMENT PLAN FOR OBESITY:  Recommended Dietary Goals  Susan Dorsey is currently in the action stage of change. As such, her goal is to continue weight management plan. She has agreed to practicing portion control and making smarter food choices, such as increasing vegetables and decreasing simple carbohydrates.  Behavioral Intervention  We discussed the following Behavioral Modification Strategies today: increasing lean protein intake to established goals, decreasing simple carbohydrates , increasing vegetables, increasing fiber rich foods, increasing water intake , work on meal planning and  preparation, reading food labels , keeping healthy foods at home, continue to work on maintaining a reduced calorie state, getting the recommended amount of protein, incorporating whole foods, making healthy choices, staying well hydrated and practicing mindfulness when eating., and increase protein intake, fibrous foods (25 grams per day for women, 30 grams for men) and water to improve satiety and decrease hunger signals. Susan Dorsey  Additional resources provided today: NA  Recommended Physical Activity Goals  Susan Dorsey has been advised to work up to 150 minutes of moderate intensity aerobic activity a week and strengthening exercises 2-3 times per week for cardiovascular health, weight loss maintenance and preservation of muscle mass.   She has agreed to Think about enjoyable ways to increase daily physical activity and overcoming barriers to exercise, Increase physical activity in their day and reduce sedentary time (increase NEAT)., Continue to gradually increase the amount and intensity of exercise routine, and Combine aerobic and strengthening exercises for efficiency and improved cardiometabolic health.   Pharmacotherapy We discussed various medication options to help Susan Dorsey with her weight loss efforts and we both agreed to continue Zepbound  12.5mg .  side effects discussed.  ASSOCIATED CONDITIONS ADDRESSED TODAY  Action/Plan  Fatty liver -     Comprehensive metabolic panel with GFR  Vitamin D  deficiency -     Vitamin D  (Ergocalciferol ); Take 1 tablet twice monthly  Dispense: 2 capsule; Refill: 1  Postoperative malabsorption -     Vitamin B1 -     Prealbumin -     Folate -     Ferritin -     Vitamin B12 -     VITAMIN D  25 Hydroxy (Vit-D Deficiency, Fractures) -     TSH -     Lipid Panel With LDL/HDL Ratio -     Comprehensive metabolic panel with GFR -     CBC with Differential/Platelet  Class 3 severe obesity due to excess calories with serious comorbidity and body mass index (BMI) of 40.0  to 44.9 in adult -     Zepbound ; Inject 12.5 mg into the skin once a week.  Dispense: 2 mL; Refill: 1         Return in about 4 weeks (around 09/19/2024).Susan Dorsey She was informed of the importance of frequent follow up visits to maximize her success with intensive lifestyle modifications for her multiple health conditions.   ATTESTASTION STATEMENTS:  Reviewed by clinician on day of visit: allergies, medications, problem list, medical history, surgical history, family history, social history, and previous encounter notes.     Susan Dorsey. Susan Tupper FNP-C

## 2024-08-27 LAB — LIPID PANEL WITH LDL/HDL RATIO
Cholesterol, Total: 199 mg/dL (ref 100–199)
HDL: 60 mg/dL (ref >39)
LDL Chol Calc (NIH): 125 mg/dL — ABNORMAL HIGH (ref 0–99)
LDL/HDL Ratio: 2.1 ratio (ref 0.0–3.2)
Triglycerides: 80 mg/dL (ref 0–149)
VLDL Cholesterol Cal: 14 mg/dL (ref 5–40)

## 2024-08-27 LAB — CBC WITH DIFFERENTIAL/PLATELET
Basophils Absolute: 0 x10E3/uL (ref 0.0–0.2)
Basos: 1 %
EOS (ABSOLUTE): 0.1 x10E3/uL (ref 0.0–0.4)
Eos: 2 %
Hematocrit: 48.9 % — ABNORMAL HIGH (ref 34.0–46.6)
Hemoglobin: 15.8 g/dL (ref 11.1–15.9)
Immature Grans (Abs): 0 x10E3/uL (ref 0.0–0.1)
Immature Granulocytes: 0 %
Lymphocytes Absolute: 1.5 x10E3/uL (ref 0.7–3.1)
Lymphs: 29 %
MCH: 31 pg (ref 26.6–33.0)
MCHC: 32.3 g/dL (ref 31.5–35.7)
MCV: 96 fL (ref 79–97)
Monocytes Absolute: 0.4 x10E3/uL (ref 0.1–0.9)
Monocytes: 8 %
Neutrophils Absolute: 3.2 x10E3/uL (ref 1.4–7.0)
Neutrophils: 60 %
Platelets: 261 x10E3/uL (ref 150–450)
RBC: 5.09 x10E6/uL (ref 3.77–5.28)
RDW: 13.1 % (ref 11.7–15.4)
WBC: 5.3 x10E3/uL (ref 3.4–10.8)

## 2024-08-27 LAB — COMPREHENSIVE METABOLIC PANEL WITH GFR
ALT: 19 IU/L (ref 0–32)
AST: 18 IU/L (ref 0–40)
Albumin: 4.5 g/dL (ref 3.9–4.9)
Alkaline Phosphatase: 114 IU/L (ref 44–121)
BUN/Creatinine Ratio: 20 (ref 12–28)
BUN: 15 mg/dL (ref 8–27)
Bilirubin Total: 0.4 mg/dL (ref 0.0–1.2)
CO2: 25 mmol/L (ref 20–29)
Calcium: 10 mg/dL (ref 8.7–10.3)
Chloride: 102 mmol/L (ref 96–106)
Creatinine, Ser: 0.74 mg/dL (ref 0.57–1.00)
Globulin, Total: 2.4 g/dL (ref 1.5–4.5)
Glucose: 76 mg/dL (ref 70–99)
Potassium: 4.6 mmol/L (ref 3.5–5.2)
Sodium: 139 mmol/L (ref 134–144)
Total Protein: 6.9 g/dL (ref 6.0–8.5)
eGFR: 92 mL/min/1.73 (ref >59)

## 2024-08-27 LAB — PREALBUMIN: PREALBUMIN: 19 mg/dL (ref 10–36)

## 2024-08-27 LAB — VITAMIN B12: Vitamin B-12: 1474 pg/mL — ABNORMAL HIGH (ref 232–1245)

## 2024-08-27 LAB — FOLATE: Folate: >20 ng/mL (ref >3.0)

## 2024-08-27 LAB — FERRITIN: Ferritin: 56 ng/mL (ref 15–150)

## 2024-08-27 LAB — VITAMIN D 25 HYDROXY (VIT D DEFICIENCY, FRACTURES): Vit D, 25-Hydroxy: 67.9 ng/mL (ref 30.0–100.0)

## 2024-08-27 LAB — TSH: TSH: 2.99 u[IU]/mL (ref 0.450–4.500)

## 2024-08-27 LAB — VITAMIN B1: Thiamine: 139 nmol/L (ref 66.5–200.0)

## 2024-09-25 ENCOUNTER — Encounter (HOSPITAL_COMMUNITY): Payer: Self-pay | Admitting: *Deleted

## 2024-09-26 ENCOUNTER — Ambulatory Visit: Admitting: Nurse Practitioner

## 2024-09-30 ENCOUNTER — Encounter: Payer: Self-pay | Admitting: Podiatry

## 2024-09-30 ENCOUNTER — Ambulatory Visit: Admitting: Podiatry

## 2024-09-30 DIAGNOSIS — L600 Ingrowing nail: Secondary | ICD-10-CM

## 2024-09-30 NOTE — Patient Instructions (Signed)

## 2024-09-30 NOTE — Progress Notes (Signed)
 Subjective:  Patient ID: Susan Dorsey, female    DOB: 1961-06-06,   MRN: [id number]  Chief Complaint  Patient presents with   Nail Problem    I have an ingrown toenail on my left big toe and the side of my second toe.  (Hallux - medial and lateral borders; 2nd medial border.)    63 y.o. female presents for concern of left great toe and second toe ingrown nails. Has had issues for a while. She has had the right side taken care of and hoping to now have the left removed.  . Denies any other pedal complaints. Denies n/v/f/c.   Past Medical History:  Diagnosis Date   Allergy    Anemia    B12 deficiency    Food allergy    Gallbladder problem    GERD (gastroesophageal reflux disease)    Gluten intolerance    Gluten intolerance    Hyperlipidemia    borderline   Hypertension    Resolved after LAGB   IBS (irritable bowel syndrome)    Joint pain    Keratoconus    Morbid obesity (HCC)    Obesity    Peeling of nails    Pneumonia    Swallowing difficulty    Swelling    feet and legs   Swelling of both lower extremities    TMJ (dislocation of temporomandibular joint)    Vitamin D  deficiency    Wears glasses    White coat syndrome with diagnosis of hypertension     Objective:  Physical Exam: Vascular: DP/PT pulses 2/4 bilateral. CFT <3 seconds. Normal hair growth on digits. No edema.  Skin. No lacerations or abrasions bilateral feet. Incurvation of bilateral borders of left hallux and medial border of left second digit. No erythema edema or purulence noted.  Musculoskeletal: MMT 5/5 bilateral lower extremities in DF, PF, Inversion and Eversion. Deceased ROM in DF of ankle joint.  Neurological: Sensation intact to light touch.   Assessment:   1. Ingrown left greater toenail   2. Ingrown nail of second toe of left foot      Plan:  Patient was evaluated and treated and all questions answered. Discussed ingrown toenails etiology and treatment options including procedure  for removal vs conservative care.  Patient requesting removal of ingrown nail today. Procedure below.  Discussed procedure and post procedure care and patient expressed understanding.  Will follow-up in 2 weeks for nail check or sooner if any problems arise.    Procedure:  Procedure: partial Nail Avulsion of bilaterally hallux bilateral nail border. Medial second digit nail border Surgeon: Asberry Failing, DPM  Pre-op Dx: Ingrown toenail without infection Post-op: Same  Place of Surgery: Office exam room.  Indications for surgery: Painful and ingrown toenail.    The patient is requesting removal of nail with  chemical matrixectomy. Risks and complications were discussed with the patient for which they understand and written consent was obtained. Under sterile conditions a total of 3 mL of  1% lidocaine  plain was infiltrated in a hallux block fashion. Once anesthetized, the skin was prepped in sterile fashion. A tourniquet was then applied. Next the bilateral aspect of hallux nail border was then sharply excised and the medial border of the second digit nail making sure to remove the entire offending nail border.  Next phenol was then applied under standard conditions to permanently destroy the matrix and copiously irrigated. Silvadene was applied. A dry sterile dressing was applied. After application of the dressing the tourniquet  was removed and there is found to be an immediate capillary refill time to the digit. The patient tolerated the procedure well without any complications. Post procedure instructions were discussed the patient for which he verbally understood. Follow-up in two weeks for nail check or sooner if any problems are to arise. Discussed signs/symptoms of infection and directed to call the office immediately should any occur or go directly to the emergency room. In the meantime, encouraged to call the office with any questions, concerns, changes symptoms.   Asberry Failing, DPM

## 2024-10-17 ENCOUNTER — Encounter: Payer: Self-pay | Admitting: Nurse Practitioner

## 2024-10-17 ENCOUNTER — Other Ambulatory Visit (HOSPITAL_BASED_OUTPATIENT_CLINIC_OR_DEPARTMENT_OTHER): Payer: Self-pay

## 2024-10-17 ENCOUNTER — Ambulatory Visit: Admitting: Nurse Practitioner

## 2024-10-17 VITALS — BP 122/67 | HR 58 | Temp 97.9°F | Ht 66.0 in | Wt 243.0 lb

## 2024-10-17 DIAGNOSIS — E7849 Other hyperlipidemia: Secondary | ICD-10-CM

## 2024-10-17 DIAGNOSIS — R632 Polyphagia: Secondary | ICD-10-CM

## 2024-10-17 DIAGNOSIS — E66812 Obesity, class 2: Secondary | ICD-10-CM

## 2024-10-17 DIAGNOSIS — Z6839 Body mass index (BMI) 39.0-39.9, adult: Secondary | ICD-10-CM

## 2024-10-17 MED ORDER — ZEPBOUND 12.5 MG/0.5ML ~~LOC~~ SOAJ
12.5000 mg | SUBCUTANEOUS | 1 refills | Status: DC
  Filled 2024-10-17: qty 2, 28d supply, fill #0
  Filled 2024-11-12: qty 2, 28d supply, fill #1

## 2024-10-17 NOTE — Progress Notes (Signed)
 Office: [phone]  /  Fax: [phone]  WEIGHT SUMMARY AND BIOMETRICS  Weight Lost Since Last Visit: 6lb  Weight Gained Since Last Visit: 0lb   Vitals Temp: 97.9 F (36.6 C) BP: 122/67 Pulse Rate: (!) 58 SpO2: 99 %   Anthropometric Measurements Height: 5' 6 (1.676 m) Weight: 243 lb (110.2 kg) BMI (Calculated): 39.24 Weight at Last Visit: 249lb Weight Lost Since Last Visit: 6lb Weight Gained Since Last Visit: 0lb Starting Weight: 273lb Total Weight Loss (lbs): 30 lb (13.6 kg)   Body Composition  Body Fat %: 50.4 % Fat Mass (lbs): 122.8 lbs Muscle Mass (lbs): 114.6 lbs Total Body Water (lbs): 90.2 lbs Visceral Fat Rating : 16   Other Clinical Data Fasting: Yes Labs: No Today's Visit #: 35 Starting Date: 07/30/20     HPI  Chief Complaint: OBESITY  Sophiana is here to discuss her progress with her obesity treatment plan. She is on the keeping a food journal and adhering to recommended goals of 1500 calories and 90 protein and states she is following her eating plan approximately 80 % of the time. She states she is exercising 20-60 minutes 3-5 days per week.   Interval History:  Since last office visit she has lost 6 pounds.  She overall feels that she is doing well.  She is making healthier choices, meal prepping and watching her portion sizes. She is drinking water, protein shake, diet sodas and coffee.   She is exercising with the [facility] program at [facility] 3 days and walking 1 mile 5 days per week.      BF:  yogurt and blueberries with granola Snack:  cheese stick and 1/2 [place] stick Lunch:  greek salad Snack:  vegetables with occ cheese stick Dinner:  protein shake   Pharmacotherapy for weight loss: She is currently taking Zepbound  12.5 mg  for medical weight loss.  Notes occ diarrhea.     Previous pharmacotherapy for medical weight loss:   She has tried Saxenda , Wegovy  and Mounjaro -side effects of diarrhea and vomiting.     Bariatric  surgery:   Patient is status post Lap Band with Dr. Nadara in 2008 and conversion to Sleeve Gastrectomy in 2020 by Dr. Gladis. Her highest weight prior to surgery was 380 lbs and her nadir weight after surgery was 211 lbs. She is taking calcium, MV with iron and Vit D 50,000 IU every 2 weeks. She reports some restriction.  Hyperlipidemia Medication(s): None.   Lab Results  Component Value Date   CHOL 199 08/22/2024   HDL 60 08/22/2024   LDLCALC 125 (H) 08/22/2024   TRIG 80 08/22/2024   CHOLHDL 3.4 02/01/2022   Lab Results  Component Value Date   ALT 19 08/22/2024   AST 18 08/22/2024   ALKPHOS 114 08/22/2024   BILITOT 0.4 08/22/2024   The 10-year ASCVD risk score (Arnett DK, et al., 2019) is: 3.1%   Values used to calculate the score:     Age: 8 years     Clincally relevant sex: Female     Is Non-Hispanic African American: No     Diabetic: No     Tobacco smoker: No     Systolic Blood Pressure: 122 mmHg     Is BP treated: No     HDL Cholesterol: 60 mg/dL     Total Cholesterol: 199 mg/dL   PHYSICAL EXAM:  Blood pressure 122/67, pulse (!) 58, temperature 97.9 F (36.6 C), height 5' 6 (1.676 m), weight 243 lb (  110.2 kg), last menstrual period 02/15/2020, SpO2 99%. Body mass index is 39.22 kg/m.  General: She is overweight, cooperative, alert, well developed, and in no acute distress. PSYCH: Has normal mood, affect and thought process.   Extremities: No edema.  Neurologic: No gross sensory or motor deficits. No tremors or fasciculations noted.    DIAGNOSTIC DATA REVIEWED:  BMET    Component Value Date/Time   NA 139 08/22/2024 0820   K 4.6 08/22/2024 0820   CL 102 08/22/2024 0820   CO2 25 08/22/2024 0820   GLUCOSE 76 08/22/2024 0820   GLUCOSE 89 02/27/2019 0807   BUN 15 08/22/2024 0820   CREATININE 0.74 08/22/2024 0820   CALCIUM 10.0 08/22/2024 0820   GFRNONAA 76 07/30/2020 1201   GFRAA 88 07/30/2020 1201   Lab Results  Component Value Date   HGBA1C 5.4  02/01/2022   HGBA1C 5.1 07/14/2017   Lab Results  Component Value Date   INSULIN  5.2 02/01/2022   INSULIN  6.0 07/14/2017   Lab Results  Component Value Date   TSH 2.990 08/22/2024   CBC    Component Value Date/Time   WBC 5.3 08/22/2024 0820   WBC 12.7 (H) 03/05/2019 0430   RBC 5.09 08/22/2024 0820   RBC 4.57 03/05/2019 0430   HGB 15.8 08/22/2024 0820   HCT 48.9 (H) 08/22/2024 0820   PLT 261 08/22/2024 0820   MCV 96 08/22/2024 0820   MCH 31.0 08/22/2024 0820   MCH 29.3 03/05/2019 0430   MCHC 32.3 08/22/2024 0820   MCHC 31.2 03/05/2019 0430   RDW 13.1 08/22/2024 0820   Iron Studies    Component Value Date/Time   IRON 100 08/08/2023 0825   TIBC 294 07/30/2020 1201   FERRITIN 56 08/22/2024 0820   IRONPCTSAT 26 07/30/2020 1201   Lipid Panel     Component Value Date/Time   CHOL 199 08/22/2024 0820   TRIG 80 08/22/2024 0820   HDL 60 08/22/2024 0820   CHOLHDL 3.4 02/01/2022 0751   LDLCALC 125 (H) 08/22/2024 0820   Hepatic Function Panel     Component Value Date/Time   PROT 6.9 08/22/2024 0820   ALBUMIN 4.5 08/22/2024 0820   AST 18 08/22/2024 0820   ALT 19 08/22/2024 0820   ALKPHOS 114 08/22/2024 0820   BILITOT 0.4 08/22/2024 0820      Component Value Date/Time   TSH 2.990 08/22/2024 0820   Nutritional Lab Results  Component Value Date   VD25OH 67.9 08/22/2024   VD25OH 69.2 08/08/2023   VD25OH 61.9 08/10/2022     ASSESSMENT AND PLAN  TREATMENT PLAN FOR OBESITY:  Recommended Dietary Goals  Oluwasemilore is currently in the action stage of change. As such, her goal is to continue weight management plan. She has agreed to keeping a food journal and adhering to recommended goals of 1500 calories and 85+ grams protein.  Behavioral Intervention  We discussed the following Behavioral Modification Strategies today: increasing lean protein intake to established goals, decreasing simple carbohydrates , increasing vegetables, increasing fiber rich foods, increasing  water intake , work on meal planning and preparation, reading food labels , keeping healthy foods at home, continue to work on maintaining a reduced calorie state, getting the recommended amount of protein, incorporating whole foods, making healthy choices, staying well hydrated and practicing mindfulness when eating., and increase protein intake, fibrous foods (25 grams per day for women, 30 grams for men) and water to improve satiety and decrease hunger signals. .  Additional resources provided today:  NA  Recommended Physical Activity Goals  Lashelle has been advised to work up to 150 minutes of moderate intensity aerobic activity a week and strengthening exercises 2-3 times per week for cardiovascular health, weight loss maintenance and preservation of muscle mass.   She has agreed to Think about enjoyable ways to increase daily physical activity and overcoming barriers to exercise, Increase physical activity in their day and reduce sedentary time (increase NEAT)., Continue to gradually increase the amount and intensity of exercise routine, Increase volume of physical activity to a goal of 240 minutes a week, and Combine aerobic and strengthening exercises for efficiency and improved cardiometabolic health.   Pharmacotherapy We discussed various medication options to help Kayleeann with her weight loss efforts and we both agreed to continue Zepbound  12.5mg . Side effects discussed.  ASSOCIATED CONDITIONS ADDRESSED TODAY  Action/Plan  Other hyperlipidemia Cardiovascular risk and specific lipid/LDL goals reviewed.  We discussed several lifestyle modifications today and Jolin will continue to work on diet, exercise and weight loss efforts. Orders and follow up as documented in patient record.   Counseling Intensive lifestyle modifications are the first line treatment for this issue. Dietary changes: Increase soluble fiber. Decrease simple carbohydrates. Exercise changes: Moderate to vigorous-intensity  aerobic activity 150 minutes per week if tolerated. Lipid-lowering medications: see documented in medical record.   Polyphagia -     Zepbound ; Inject 12.5 mg into the skin once a week.  Dispense: 2 mL; Refill: 1  Obesity, Class II, BMI 35-39.9 -     Zepbound ; Inject 12.5 mg into the skin once a week.  Dispense: 2 mL; Refill: 1      Labs reviewed in chart with patient from 08/22/24  Vit B12 is high. Patient to reduce Vit B12 dose Vit D is high.  To reduce VitD dose   Return in about 7 weeks (around 12/05/2024).SABRA She was informed of the importance of frequent follow up visits to maximize her success with intensive lifestyle modifications for her multiple health conditions.   ATTESTASTION STATEMENTS:  Reviewed by clinician on day of visit: allergies, medications, problem list, medical history, surgical history, family history, social history, and previous encounter notes.     Corean SAUNDERS. Hurman Ketelsen FNP-C

## 2024-12-05 ENCOUNTER — Ambulatory Visit: Admitting: Nurse Practitioner

## 2024-12-05 ENCOUNTER — Encounter: Payer: Self-pay | Admitting: Nurse Practitioner

## 2024-12-05 ENCOUNTER — Other Ambulatory Visit (HOSPITAL_BASED_OUTPATIENT_CLINIC_OR_DEPARTMENT_OTHER): Payer: Self-pay

## 2024-12-05 VITALS — BP 110/75 | HR 75 | Ht 66.0 in | Wt 243.0 lb

## 2024-12-05 DIAGNOSIS — Z6839 Body mass index (BMI) 39.0-39.9, adult: Secondary | ICD-10-CM

## 2024-12-05 DIAGNOSIS — E66812 Obesity, class 2: Secondary | ICD-10-CM | POA: Diagnosis not present

## 2024-12-05 DIAGNOSIS — R632 Polyphagia: Secondary | ICD-10-CM

## 2024-12-05 MED ORDER — ZEPBOUND 15 MG/0.5ML ~~LOC~~ SOAJ
15.0000 mg | SUBCUTANEOUS | 0 refills | Status: DC
  Filled 2024-12-05: qty 2, 28d supply, fill #0

## 2024-12-05 NOTE — Progress Notes (Signed)
 Office: [phone]  /  Fax: [phone]  WEIGHT SUMMARY AND BIOMETRICS  Weight Lost Since Last Visit: 0  Weight Gained Since Last Visit: 0   Vitals BP: 110/75 Pulse Rate: 75 SpO2: 99 %   Anthropometric Measurements Height: 5' 6 (1.676 m) Weight: 243 lb (110.2 kg) BMI (Calculated): 39.24 Weight at Last Visit: 243lb Weight Lost Since Last Visit: 0 Weight Gained Since Last Visit: 0 Starting Weight: 273lb Total Weight Loss (lbs): 30 lb (13.6 kg)   Body Composition  Body Fat %: 50.8 % Fat Mass (lbs): 123.8 lbs Muscle Mass (lbs): 113.8 lbs Total Body Water (lbs): 93.4 lbs Visceral Fat Rating : 16   Other Clinical Data Fasting: no Labs: no Today's Visit #: 56 Starting Date: 07/30/20     HPI  Chief Complaint: OBESITY  Susan Dorsey is here to discuss her progress with her obesity treatment plan. She is on the keeping a food journal and adhering to recommended goals of 1500 calories and 90 protein and states she is following her eating plan approximately 60 % of the time. She states she is exercising 180 minutes 3 days per week.   Interval History:  Since last office visit she has maintained her weight.  She has been working more and at off times so she has not been able to follow her meal plan/routine as she usually dose.  She plays [employer] and is working multiple days per week.  She is pleased that she maintained, usually she gains this time of the year.  She has been living off protein bars, protein shakes and cookies.  Notes some polyphagia and cravings 5 days after injection.   Previous pharmacotherapy for medical weight loss:   She has tried Saxenda , Wegovy  and Mounjaro -side effects of diarrhea and vomiting.     Bariatric surgery:   Patient is status post Lap Band with Dr. Nadara in 2008 and conversion to Sleeve Gastrectomy in 2020 by Dr. Gladis. Her highest weight prior to surgery was 380 lbs and her nadir weight after surgery was 211 lbs. She is taking calcium,  MV with iron and Vit D 50,000 IU every 2 weeks. She reports some restriction.    PHYSICAL EXAM:  Blood pressure 110/75, pulse 75, height 5' 6 (1.676 m), weight 243 lb (110.2 kg), last menstrual period 02/15/2020, SpO2 99%. Body mass index is 39.22 kg/m.  General: She is overweight, cooperative, alert, well developed, and in no acute distress. PSYCH: Has normal mood, affect and thought process.   Extremities: No edema.  Neurologic: No gross sensory or motor deficits. No tremors or fasciculations noted.    DIAGNOSTIC DATA REVIEWED:  BMET    Component Value Date/Time   NA 139 08/22/2024 0820   K 4.6 08/22/2024 0820   CL 102 08/22/2024 0820   CO2 25 08/22/2024 0820   GLUCOSE 76 08/22/2024 0820   GLUCOSE 89 02/27/2019 0807   BUN 15 08/22/2024 0820   CREATININE 0.74 08/22/2024 0820   CALCIUM 10.0 08/22/2024 0820   GFRNONAA 76 07/30/2020 1201   GFRAA 88 07/30/2020 1201   Lab Results  Component Value Date   HGBA1C 5.4 02/01/2022   HGBA1C 5.1 07/14/2017   Lab Results  Component Value Date   INSULIN  5.2 02/01/2022   INSULIN  6.0 07/14/2017   Lab Results  Component Value Date   TSH 2.990 08/22/2024   CBC    Component Value Date/Time   WBC 5.3 08/22/2024 0820   WBC 12.7 (H) 03/05/2019 0430   RBC  5.09 08/22/2024 0820   RBC 4.57 03/05/2019 0430   HGB 15.8 08/22/2024 0820   HCT 48.9 (H) 08/22/2024 0820   PLT 261 08/22/2024 0820   MCV 96 08/22/2024 0820   MCH 31.0 08/22/2024 0820   MCH 29.3 03/05/2019 0430   MCHC 32.3 08/22/2024 0820   MCHC 31.2 03/05/2019 0430   RDW 13.1 08/22/2024 0820   Iron Studies    Component Value Date/Time   IRON 100 08/08/2023 0825   TIBC 294 07/30/2020 1201   FERRITIN 56 08/22/2024 0820   IRONPCTSAT 26 07/30/2020 1201   Lipid Panel     Component Value Date/Time   CHOL 199 08/22/2024 0820   TRIG 80 08/22/2024 0820   HDL 60 08/22/2024 0820   CHOLHDL 3.4 02/01/2022 0751   LDLCALC 125 (H) 08/22/2024 0820   Hepatic Function Panel      Component Value Date/Time   PROT 6.9 08/22/2024 0820   ALBUMIN 4.5 08/22/2024 0820   AST 18 08/22/2024 0820   ALT 19 08/22/2024 0820   ALKPHOS 114 08/22/2024 0820   BILITOT 0.4 08/22/2024 0820      Component Value Date/Time   TSH 2.990 08/22/2024 0820   Nutritional Lab Results  Component Value Date   VD25OH 67.9 08/22/2024   VD25OH 69.2 08/08/2023   VD25OH 61.9 08/10/2022     ASSESSMENT AND PLAN  TREATMENT PLAN FOR OBESITY:  Recommended Dietary Goals  Susan Dorsey is currently in the action stage of change. As such, her goal is to continue weight management plan. She has agreed to practicing portion control and making smarter food choices, such as increasing vegetables and decreasing simple carbohydrates.  Behavioral Intervention  We discussed the following Behavioral Modification Strategies today: increasing lean protein intake to established goals, decreasing simple carbohydrates , increasing vegetables, increasing fiber rich foods, increasing water intake , work on meal planning and preparation, reading food labels , keeping healthy foods at home, planning for success, better snacking choices, celebration eating strategies, continue to work on maintaining a reduced calorie state, getting the recommended amount of protein, incorporating whole foods, making healthy choices, staying well hydrated and practicing mindfulness when eating., and increase protein intake, fibrous foods (25 grams per day for women, 30 grams for men) and water to improve satiety and decrease hunger signals. .  Additional resources provided today: NA  Recommended Physical Activity Goals  Susan Dorsey has been advised to work up to 150 minutes of moderate intensity aerobic activity a week and strengthening exercises 2-3 times per week for cardiovascular health, weight loss maintenance and preservation of muscle mass.   She has agreed to Think about enjoyable ways to increase daily physical activity and overcoming  barriers to exercise, Increase physical activity in their day and reduce sedentary time (increase NEAT)., Continue to gradually increase the amount and intensity of exercise routine, Increase volume of physical activity to a goal of 240 minutes a week, and Combine aerobic and strengthening exercises for efficiency and improved cardiometabolic health.   Pharmacotherapy We discussed various medication options to help Susan Dorsey with her weight loss efforts and we both agreed to increase Zepbound  15mg .  Side effects discussed.  ASSOCIATED CONDITIONS ADDRESSED TODAY  Action/Plan  Polyphagia -     Zepbound ; Inject 15 mg into the skin once a week.  Dispense: 2 mL; Refill: 0  Obesity, Class II, BMI 35-39.9 -     Zepbound ; Inject 15 mg into the skin once a week.  Dispense: 2 mL; Refill: 0  Return in about 7 weeks (around 01/23/2025).Susan Dorsey She was informed of the importance of frequent follow up visits to maximize her success with intensive lifestyle modifications for her multiple health conditions.   ATTESTASTION STATEMENTS:  Reviewed by clinician on day of visit: allergies, medications, problem list, medical history, surgical history, family history, social history, and previous encounter notes.      Corean SAUNDERS. Susan Mcphearson FNP-C

## 2025-01-07 ENCOUNTER — Other Ambulatory Visit: Payer: Self-pay | Admitting: Nurse Practitioner

## 2025-01-07 ENCOUNTER — Encounter (HOSPITAL_BASED_OUTPATIENT_CLINIC_OR_DEPARTMENT_OTHER): Payer: Self-pay

## 2025-01-07 ENCOUNTER — Other Ambulatory Visit (HOSPITAL_BASED_OUTPATIENT_CLINIC_OR_DEPARTMENT_OTHER): Payer: Self-pay

## 2025-01-07 DIAGNOSIS — R632 Polyphagia: Secondary | ICD-10-CM

## 2025-01-07 DIAGNOSIS — E66812 Obesity, class 2: Secondary | ICD-10-CM

## 2025-01-07 MED ORDER — ZEPBOUND 15 MG/0.5ML ~~LOC~~ SOAJ
15.0000 mg | SUBCUTANEOUS | 0 refills | Status: DC
  Filled 2025-01-07 – 2025-01-08 (×2): qty 2, 28d supply, fill #0

## 2025-01-08 ENCOUNTER — Other Ambulatory Visit (HOSPITAL_BASED_OUTPATIENT_CLINIC_OR_DEPARTMENT_OTHER): Payer: Self-pay

## 2025-01-09 ENCOUNTER — Other Ambulatory Visit (HOSPITAL_BASED_OUTPATIENT_CLINIC_OR_DEPARTMENT_OTHER): Payer: Self-pay

## 2025-01-20 ENCOUNTER — Encounter: Payer: Self-pay | Admitting: Nurse Practitioner

## 2025-01-23 ENCOUNTER — Ambulatory Visit: Admitting: Nurse Practitioner

## 2025-01-23 ENCOUNTER — Encounter: Payer: Self-pay | Admitting: Nurse Practitioner

## 2025-01-23 VITALS — BP 136/83 | HR 67 | Temp 98.1°F | Ht 66.0 in | Wt 240.0 lb

## 2025-01-23 DIAGNOSIS — K912 Postsurgical malabsorption, not elsewhere classified: Secondary | ICD-10-CM

## 2025-01-23 DIAGNOSIS — R632 Polyphagia: Secondary | ICD-10-CM

## 2025-01-23 DIAGNOSIS — Z79899 Other long term (current) drug therapy: Secondary | ICD-10-CM

## 2025-01-23 DIAGNOSIS — Z6838 Body mass index (BMI) 38.0-38.9, adult: Secondary | ICD-10-CM

## 2025-01-23 DIAGNOSIS — E66812 Obesity, class 2: Secondary | ICD-10-CM

## 2025-01-23 DIAGNOSIS — E559 Vitamin D deficiency, unspecified: Secondary | ICD-10-CM

## 2025-01-23 DIAGNOSIS — R5383 Other fatigue: Secondary | ICD-10-CM | POA: Diagnosis not present

## 2025-01-23 MED ORDER — ZEPBOUND 15 MG/0.5ML ~~LOC~~ SOAJ: 15.0000 mg | SUBCUTANEOUS | 0 refills | Status: AC

## 2025-01-23 NOTE — Progress Notes (Signed)
 "  Office: [phone]  /  Fax: [phone]  WEIGHT SUMMARY AND BIOMETRICS  Weight Lost Since Last Visit: 3lb  Weight Gained Since Last Visit: 0lb   Vitals Temp: 98.1 F (36.7 C) BP: 136/83 Pulse Rate: 67 SpO2: 98 %   Anthropometric Measurements Height: 5' 6 (1.676 m) Weight: 240 lb (108.9 kg) BMI (Calculated): 38.76 Weight at Last Visit: 243lb Weight Lost Since Last Visit: 3lb Weight Gained Since Last Visit: 0lb Starting Weight: 273lb Total Weight Loss (lbs): 33 lb (15 kg)   Body Composition  Body Fat %: 49.8 % Fat Mass (lbs): 119.6 lbs Muscle Mass (lbs): 114.6 lbs Total Body Water (lbs): 89 lbs Visceral Fat Rating : 15   Other Clinical Data Fasting: Yes Labs: Yes Today's Visit #: 69 Starting Date: 07/30/20     HPI  Chief Complaint: OBESITY  Idaly is here to discuss her progress with her obesity treatment plan. She is on the keeping a food journal and adhering to recommended goals of 1500 calories and 90 protein and states she is following her eating plan approximately 60 % of the time. She states she is exercising 0 minutes 0 days per week.   Interval History:  Since last office visit she has lost 3 pounds.  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  She is drinking coffee, diet soda x 2, ICE and water daily.  She is going to the gym doing the [employer] program 3 days per week and walking daily for 20 mins at work.    Pharmacotherapy for weight loss: She is currently taking Zepbound  15 mg  for medical weight loss.  Notes occ diarrhea and some fatigue.   Previous pharmacotherapy for medical weight loss:   She has tried Saxenda , Wegovy  and Mounjaro -side effects of diarrhea and vomiting.     Bariatric surgery:   Patient is status post Lap Band with Dr. Nadara in 2008 and conversion to Sleeve Gastrectomy in 2020 by Dr. Gladis. Her highest weight prior to surgery was 380 lbs and her nadir  weight after surgery was 211 lbs. She is taking calcium and Vit D and MV with iron. She reports some restriction.     PHYSICAL EXAM:  Blood pressure 136/83, pulse 67, temperature 98.1 F (36.7 C), height 5' 6 (1.676 m), weight 240 lb (108.9 kg), last menstrual period 02/15/2020, SpO2 98%. Body mass index is 38.74 kg/m.  General: She is overweight, cooperative, alert, well developed, and in no acute distress. PSYCH: Has normal mood, affect and thought process.   Extremities: No edema.  Neurologic: No gross sensory or motor deficits. No tremors or fasciculations noted.    DIAGNOSTIC DATA REVIEWED:  BMET    Component Value Date/Time   NA 139 08/22/2024 0820   K 4.6 08/22/2024 0820   CL 102 08/22/2024 0820   CO2 25 08/22/2024 0820   GLUCOSE 76 08/22/2024 0820   GLUCOSE 89 02/27/2019 0807   BUN 15 08/22/2024 0820   CREATININE 0.74 08/22/2024 0820   CALCIUM 10.0 08/22/2024 0820   GFRNONAA 76 07/30/2020 1201   GFRAA 88 07/30/2020 1201   Lab Results  Component Value Date   HGBA1C 5.4 02/01/2022   HGBA1C 5.1 07/14/2017   Lab Results  Component Value Date   INSULIN  5.2 02/01/2022   INSULIN  6.0 07/14/2017   Lab Results  Component Value Date   TSH 2.990 08/22/2024   CBC  Component Value Date/Time   WBC 5.3 08/22/2024 0820   WBC 12.7 (H) 03/05/2019 0430   RBC 5.09 08/22/2024 0820   RBC 4.57 03/05/2019 0430   HGB 15.8 08/22/2024 0820   HCT 48.9 (H) 08/22/2024 0820   PLT 261 08/22/2024 0820   MCV 96 08/22/2024 0820   MCH 31.0 08/22/2024 0820   MCH 29.3 03/05/2019 0430   MCHC 32.3 08/22/2024 0820   MCHC 31.2 03/05/2019 0430   RDW 13.1 08/22/2024 0820   Iron Studies    Component Value Date/Time   IRON 100 08/08/2023 0825   TIBC 294 07/30/2020 1201   FERRITIN 56 08/22/2024 0820   IRONPCTSAT 26 07/30/2020 1201   Lipid Panel     Component Value Date/Time   CHOL 199 08/22/2024 0820   TRIG 80 08/22/2024 0820   HDL 60 08/22/2024 0820   CHOLHDL 3.4 02/01/2022  0751   LDLCALC 125 (H) 08/22/2024 0820   Hepatic Function Panel     Component Value Date/Time   PROT 6.9 08/22/2024 0820   ALBUMIN 4.5 08/22/2024 0820   AST 18 08/22/2024 0820   ALT 19 08/22/2024 0820   ALKPHOS 114 08/22/2024 0820   BILITOT 0.4 08/22/2024 0820      Component Value Date/Time   TSH 2.990 08/22/2024 0820   Nutritional Lab Results  Component Value Date   VD25OH 67.9 08/22/2024   VD25OH 69.2 08/08/2023   VD25OH 61.9 08/10/2022     ASSESSMENT AND PLAN  TREATMENT PLAN FOR OBESITY:  Recommended Dietary Goals  Tawana is currently in the action stage of change. As such, her goal is to continue weight management plan. She has agreed to keeping a food journal and adhering to recommended goals of 1500 calories and 90 grams of protein.  Behavioral Intervention  We discussed the following Behavioral Modification Strategies today: increasing lean protein intake to established goals, decreasing simple carbohydrates , increasing vegetables, increasing fiber rich foods, avoiding skipping meals, increasing water intake , work on meal planning and preparation, work on tracking and journaling calories using tracking application, reading food labels , keeping healthy foods at home, planning for success, continue to work on maintaining a reduced calorie state, getting the recommended amount of protein, incorporating whole foods, making healthy choices, staying well hydrated and practicing mindfulness when eating., and increase protein intake, fibrous foods (25 grams per day for women, 30 grams for men) and water to improve satiety and decrease hunger signals. .  Additional resources provided today: NA  Recommended Physical Activity Goals  Masiyah has been advised to work up to 150 minutes of moderate intensity aerobic activity a week and strengthening exercises 2-3 times per week for cardiovascular health, weight loss maintenance and preservation of muscle mass.   She has agreed to  Think about enjoyable ways to increase daily physical activity and overcoming barriers to exercise, Increase physical activity in their day and reduce sedentary time (increase NEAT)., Work on scheduling and tracking physical activity. , Continue to gradually increase the amount and intensity of exercise routine, and Combine aerobic and strengthening exercises for efficiency and improved cardiometabolic health.   Pharmacotherapy We discussed various medication options to help Cozetta with her weight loss efforts and we both agreed to continue Zepbound  15mg .  Side effects discussed.  ASSOCIATED CONDITIONS ADDRESSED TODAY  Action/Plan  Polyphagia -     Zepbound ; Inject 15 mg into the skin once a week.  Dispense: 6 mL; Refill: 0  Other fatigue -     CBC with  Differential/Platelet -     Comprehensive metabolic panel with GFR -     TSH -     Vitamin B12 -     VITAMIN D  25 Hydroxy (Vit-D Deficiency, Fractures) -     Ferritin -     Iron  Vitamin D  deficiency -     VITAMIN D  25 Hydroxy (Vit-D Deficiency, Fractures)  Medication management -     Comprehensive metabolic panel with GFR  Postoperative malabsorption -     CBC with Differential/Platelet -     Comprehensive metabolic panel with GFR -     TSH -     Vitamin B12 -     VITAMIN D  25 Hydroxy (Vit-D Deficiency, Fractures) -     Ferritin -     Iron  Obesity, Class II, BMI 35-39.9 -     Zepbound ; Inject 15 mg into the skin once a week.  Dispense: 6 mL; Refill: 0     [employer] reviewed with patient.      Return in about 4 weeks (around 02/20/2025).SABRA She was informed of the importance of frequent follow up visits to maximize her success with intensive lifestyle modifications for her multiple health conditions.   ATTESTASTION STATEMENTS:  Reviewed by clinician on day of visit: allergies, medications, problem list, medical history, surgical history, family history, social history, and previous encounter notes.     Corean SAUNDERS.  Ashleen Demma FNP-C "

## 2025-01-24 LAB — COMPREHENSIVE METABOLIC PANEL WITH GFR
ALT: 29 [IU]/L (ref 0–32)
AST: 22 [IU]/L (ref 0–40)
Albumin: 4.9 g/dL (ref 3.9–4.9)
Alkaline Phosphatase: 115 [IU]/L (ref 49–135)
BUN/Creatinine Ratio: 22 (ref 12–28)
BUN: 17 mg/dL (ref 8–27)
Bilirubin Total: 0.4 mg/dL (ref 0.0–1.2)
CO2: 22 mmol/L (ref 20–29)
Calcium: 10.6 mg/dL — ABNORMAL HIGH (ref 8.7–10.3)
Chloride: 104 mmol/L (ref 96–106)
Creatinine, Ser: 0.78 mg/dL (ref 0.57–1.00)
Globulin, Total: 2.7 g/dL (ref 1.5–4.5)
Glucose: 72 mg/dL (ref 70–99)
Potassium: 5.1 mmol/L (ref 3.5–5.2)
Sodium: 146 mmol/L — ABNORMAL HIGH (ref 134–144)
Total Protein: 7.6 g/dL (ref 6.0–8.5)
eGFR: 86 mL/min/{1.73_m2}

## 2025-01-24 LAB — TSH: TSH: 4.01 u[IU]/mL (ref 0.450–4.500)

## 2025-01-24 LAB — CBC WITH DIFFERENTIAL/PLATELET
Basophils Absolute: 0.1 10*3/uL (ref 0.0–0.2)
Basos: 1 %
EOS (ABSOLUTE): 0.1 10*3/uL (ref 0.0–0.4)
Eos: 1 %
Hematocrit: 49.9 % — ABNORMAL HIGH (ref 34.0–46.6)
Hemoglobin: 16.7 g/dL — ABNORMAL HIGH (ref 11.1–15.9)
Immature Grans (Abs): 0 10*3/uL (ref 0.0–0.1)
Immature Granulocytes: 0 %
Lymphocytes Absolute: 1.9 10*3/uL (ref 0.7–3.1)
Lymphs: 32 %
MCH: 31.5 pg (ref 26.6–33.0)
MCHC: 33.5 g/dL (ref 31.5–35.7)
MCV: 94 fL (ref 79–97)
Monocytes Absolute: 0.4 10*3/uL (ref 0.1–0.9)
Monocytes: 7 %
Neutrophils Absolute: 3.5 10*3/uL (ref 1.4–7.0)
Neutrophils: 59 %
Platelets: 279 10*3/uL (ref 150–450)
RBC: 5.3 x10E6/uL — ABNORMAL HIGH (ref 3.77–5.28)
RDW: 12.8 % (ref 11.7–15.4)
WBC: 6 10*3/uL (ref 3.4–10.8)

## 2025-01-24 LAB — VITAMIN D 25 HYDROXY (VIT D DEFICIENCY, FRACTURES): Vit D, 25-Hydroxy: 48 ng/mL (ref 30.0–100.0)

## 2025-01-24 LAB — VITAMIN B12: Vitamin B-12: 1463 pg/mL — ABNORMAL HIGH (ref 232–1245)

## 2025-01-24 LAB — FERRITIN: Ferritin: 58 ng/mL (ref 15–150)

## 2025-01-24 LAB — IRON: Iron: 132 ug/dL (ref 27–139)

## 2025-01-28 ENCOUNTER — Ambulatory Visit: Payer: Self-pay | Admitting: Nurse Practitioner

## 2025-03-20 ENCOUNTER — Ambulatory Visit: Admitting: Family Medicine
